# Patient Record
Sex: Female | Born: 1965
Health system: Southern US, Community
[De-identification: ages and names within clinical notes are randomized; demographics above are authoritative.]

## PROBLEM LIST (undated history)

## (undated) ENCOUNTER — Emergency Department (HOSPITAL_COMMUNITY): Disposition: A | Discharge status: Other Acute Inpatient Hospital | Payer: Self-pay | Source: Home / Self Care

## (undated) DIAGNOSIS — E785 Hyperlipidemia, unspecified: Secondary | ICD-10-CM

## (undated) DIAGNOSIS — M25519 Pain in unspecified shoulder: Secondary | ICD-10-CM

## (undated) DIAGNOSIS — G8929 Other chronic pain: Secondary | ICD-10-CM

## (undated) DIAGNOSIS — M542 Cervicalgia: Secondary | ICD-10-CM

## (undated) DIAGNOSIS — I10 Essential (primary) hypertension: Secondary | ICD-10-CM

## (undated) DIAGNOSIS — E0590 Thyrotoxicosis, unspecified without thyrotoxic crisis or storm: Secondary | ICD-10-CM

## (undated) HISTORY — PX: JEJUNOSTOMY FEEDING TUBE: SUR737

## (undated) HISTORY — PX: CYST EXCISION: SHX5701

## (undated) HISTORY — DX: Cervicalgia: M54.2

## (undated) HISTORY — PX: TONSILLECTOMY: SUR1361

## (undated) HISTORY — DX: Other chronic pain: G89.29

## (undated) HISTORY — DX: Pain in unspecified shoulder: M25.519

## (undated) HISTORY — DX: Hyperlipidemia, unspecified: E78.5

---

## 2008-08-12 HISTORY — PX: BREAST LUMPECTOMY: SHX2

## 2010-03-14 ENCOUNTER — Encounter (INDEPENDENT_AMBULATORY_CARE_PROVIDER_SITE_OTHER): Payer: Self-pay | Admitting: Nurse Practitioner

## 2010-04-23 ENCOUNTER — Encounter: Admission: RE | Admit: 2010-04-23 | Discharge: 2010-04-23 | Payer: Self-pay | Admitting: Infectious Diseases

## 2010-04-25 ENCOUNTER — Ambulatory Visit: Payer: Self-pay | Admitting: Nurse Practitioner

## 2010-04-25 DIAGNOSIS — R3129 Other microscopic hematuria: Secondary | ICD-10-CM | POA: Insufficient documentation

## 2010-04-25 LAB — CONVERTED CEMR LAB
Glucose, Urine, Semiquant: NEGATIVE
Protein, U semiquant: NEGATIVE
pH: 5.5

## 2010-04-26 ENCOUNTER — Encounter (INDEPENDENT_AMBULATORY_CARE_PROVIDER_SITE_OTHER): Payer: Self-pay | Admitting: Nurse Practitioner

## 2010-05-08 ENCOUNTER — Encounter (INDEPENDENT_AMBULATORY_CARE_PROVIDER_SITE_OTHER): Payer: Self-pay | Admitting: Nurse Practitioner

## 2010-05-10 ENCOUNTER — Encounter (INDEPENDENT_AMBULATORY_CARE_PROVIDER_SITE_OTHER): Payer: Self-pay | Admitting: Nurse Practitioner

## 2010-05-23 ENCOUNTER — Ambulatory Visit: Payer: Self-pay | Admitting: Nurse Practitioner

## 2010-05-23 DIAGNOSIS — B379 Candidiasis, unspecified: Secondary | ICD-10-CM | POA: Insufficient documentation

## 2010-05-23 LAB — CONVERTED CEMR LAB
Nitrite: NEGATIVE
Protein, U semiquant: NEGATIVE
Urobilinogen, UA: 0.2
WBC Urine, dipstick: NEGATIVE

## 2010-06-28 ENCOUNTER — Encounter (INDEPENDENT_AMBULATORY_CARE_PROVIDER_SITE_OTHER): Payer: Self-pay | Admitting: Nurse Practitioner

## 2010-06-28 ENCOUNTER — Encounter: Payer: Self-pay | Admitting: Nurse Practitioner

## 2010-06-28 ENCOUNTER — Ambulatory Visit: Payer: Self-pay | Admitting: Nurse Practitioner

## 2010-06-28 DIAGNOSIS — N946 Dysmenorrhea, unspecified: Secondary | ICD-10-CM | POA: Insufficient documentation

## 2010-06-28 DIAGNOSIS — E669 Obesity, unspecified: Secondary | ICD-10-CM | POA: Insufficient documentation

## 2010-07-03 ENCOUNTER — Encounter: Admission: RE | Admit: 2010-07-03 | Discharge: 2010-07-03 | Payer: Self-pay | Admitting: Internal Medicine

## 2010-08-28 ENCOUNTER — Encounter (INDEPENDENT_AMBULATORY_CARE_PROVIDER_SITE_OTHER): Payer: Self-pay | Admitting: Nurse Practitioner

## 2010-09-11 NOTE — Assessment & Plan Note (Signed)
Summary: F/u vaginal itching   Vital Signs:  Patient profile:   45 year old female LMP:     06/22/2010 Weight:      156.0 pounds BMI:     27.73 Temp:     97.0 degrees F oral Pulse rate:   80 / minute Pulse rhythm:   regular Resp:     20 per minute BP sitting:   110 / 80  (left arm) Cuff size:   regular  Vitals Entered By: Levon Hedger (June 28, 2010 10:29 AM)  Nutrition Counseling: Patient's BMI is greater than 25 and therefore counseled on weight management options. CC: lower stomach pain...pain is really bad during her period Is Patient Diabetic? No Pain Assessment Patient in pain? no     Location: stomach  Does patient need assistance? Functional Status Self care Ambulation Normal LMP (date): 06/22/2010 LMP - Character: normal     Menstrual Status regular Enter LMP: 06/22/2010   CC:  lower stomach pain...pain is really bad during her period.  History of Present Illness:  Pt into the office to f/u on vaginal itching as seen in office on last month pt was prescribed mycolog of which she reports she has used and symptoms have improved.  Mammogram - pt has never had a mammogram before. No family history of breast cancer No checks of breast at home  Language line used for interpreter - [place]  Allergies (verified): No Known Drug Allergies  Social History: Language - Nepali married 3 children tobacco - none ETOH - none drug - none  Review of Systems CV:  Denies chest pain or discomfort. Resp:  Denies cough. GI:  Complains of abdominal pain; denies nausea and vomiting; lower abdominal at onset of periods.. GU:  Denies any vaginal itching.  Physical Exam  General:  alert.   Head:  normocephalic.   Ears:  ear piercing(s) noted.   Nose:  left nasal piercing Msk:  up to the exam table - no limits Neurologic:  alert & oriented X3.     Impression & Recommendations:  Problem # 1:  CANDIDIASIS (ICD-112.9) Assessment Improved symptoms  improved  Problem # 2:  UNSPECIFIED BREAST SCREENING (ICD-V76.10) self breast exam reviewed with pt mammogram scheduled Orders: Mammogram (Screening) (Mammo)  Problem # 3:  DYSMENORRHEA (ICD-625.3) pt advised that she may be having cramps due to menses will given naprosyn as needed for cramps  Complete Medication List: 1)  Nystatin-triamcinolone 100000-0.1 Unit/gm-% Oint (Nystatin-triamcinolone) .... One application topically two times a day to affected area 2)  Naproxen 500 Mg Tabs (Naproxen) .... One tablet by mouth two times a day as needed for pain  Patient Instructions: 1)  Keep your appointment for mammogram 2)  Menstrual cramps - take naprosyn 500mg  by mouth two times a day when your period is on to help with the pain in your stomach. Eat some food before you take this medications so it does not make you have nausea 3)  Follow up as needed Prescriptions: NAPROXEN 500 MG TABS (NAPROXEN) One tablet by mouth two times a day as needed for pain  #50 x 0   Entered and Authorized by:   Lehman Prom FNP   Signed by:   Lehman Prom FNP on 06/28/2010   Method used:   Print then Give to Patient   RxID:   [id number]    Orders Added: 1)  Est. Patient Level III [14782] 2)  Mammogram (Screening) [Mammo]

## 2010-09-11 NOTE — Assessment & Plan Note (Signed)
Summary: NEW - Establish Care   Vital Signs:  Patient profile:   45 year old female LMP:     03/20/2010 Height:      63 inches (160.02 cm) Weight:      151.6 pounds (68.91 kg) BMI:     26.95 Temp:     98.2 degrees F (36.78 degrees C) oral Pulse rate:   80 / minute Pulse rhythm:   regular Resp:     20 per minute BP sitting:   114 / 76  (left arm) Cuff size:   regular  Vitals Entered By: Levon Hedger (April 25, 2010 10:16 AM)  Nutrition Counseling: Patient's BMI is greater than 25 and therefore counseled on weight management options. CC: new establish...painful urination Is Patient Diabetic? No Pain Assessment Patient in pain? yes     Location: neck  Does patient need assistance? Functional Status Self care Ambulation Normal LMP (date): 03/20/2010 LMP - Character: normal     Menstrual Status regular Enter LMP: 03/20/2010   CC:  new establish...painful urination.  History of Present Illness:  Pt into the office to establish care. Pt is from [place].  In [place] since 01/2010  PMH - non significant PSH - tonsillectomy  Pt has been to the [facility] since she arrived in the [place].  Reports that labs done, Immunization done (pt does not know which one) and PAP Smear was done.  No records available from [facility]. Pt reports that she was given this appt here today by [facility] but pt is not sure why.  Presents today with sister who speaks English  Habits & Providers  Alcohol-Tobacco-Diet     Alcohol drinks/day: 0     Tobacco Status: never  Exercise-Depression-Behavior     Have you felt down or hopeless? no     Have you felt little pleasure in things? no     Depression Counseling: not indicated; screening negative for depression     Drug Use: never  Medications Prior to Update: 1)  None  Allergies (verified): No Known Drug Allergies  Past History:  Past Surgical History: Tonsillectomy (done in [place])  Family History: non-contributory  Social History: married 3  children tobacco - none ETOH - none drug - noneSmoking Status:  never Drug Use:  never  Review of Systems CV:  Denies chest pain or discomfort. Resp:  Denies cough. GI:  Denies abdominal pain, nausea, and vomiting. GU:  Denies discharge, dysuria, hematuria, urinary frequency, and urinary hesitancy; some vaginal itching after urination. intermittent for 1 year. MS:  Denies low back pain.  Physical Exam  General:  alert.   Head:  normocephalic.   Ears:  ear piercing(s) noted.   Nose:  left nare nasal piercing Lungs:  normal breath sounds.   Heart:  normal rate and regular rhythm.   Abdomen:  normal bowel sounds.   Msk:  up to the exam table no limits Neurologic:  alert & oriented X3.   Skin:  color normal.   Psych:  Oriented X3.     Impression & Recommendations:  Problem # 1:  MICROSCOPIC HEMATURIA (ICD-599.72) will send for culture since pt is having some itching Orders: UA Dipstick w/o Micro (manual) (16109) T-Culture, Urine ([id number]) will need to review records from [facility] to determine what immunizations she needs  Complete Medication List: 1)  Fluconazole 150 Mg Tabs (Fluconazole) .... One tablet by mouth x 1 dose  Patient Instructions: 1)  Sign a release to get records from [facility] 2)  Follow up with  n.martin,fnp in 4 weeks for vaginal itching. 3)  will need to review records from [facility] by that time Prescriptions: FLUCONAZOLE 150 MG TABS (FLUCONAZOLE) One tablet by mouth x 1 dose  #1 x 0   Entered and Authorized by:   Lehman Prom FNP   Signed by:   Lehman Prom FNP on 04/25/2010   Method used:   Print then Give to Patient   RxID:   [id number]   Laboratory Results   Urine Tests  Date/Time Received: April 25, 2010 10:33 AM   Routine Urinalysis   Color: lt. yellow Appearance: Hazy Glucose: negative   (Normal Range: Negative) Bilirubin: small   (Normal Range: Negative) Ketone: trace (5)   (Normal Range: Negative) Spec. Gravity: >=1.030    (Normal Range: 1.003-1.035) Blood: trace-intact   (Normal Range: Negative) pH: 5.5   (Normal Range: 5.0-8.0) Protein: negative   (Normal Range: Negative) Urobilinogen: 0.2   (Normal Range: 0-1) Nitrite: negative   (Normal Range: Negative) Leukocyte Esterace: trace   (Normal Range: Negative)

## 2010-09-11 NOTE — Letter (Signed)
Summary: REQUESTING RECORDS FROM [facility]  REQUESTING RECORDS FROM [facility]   Imported By: Arta Bruce 05/14/2010 12:23:32  _____________________________________________________________________  External Attachment:    Type:   Image     Comment:   External Document

## 2010-09-11 NOTE — Letter (Signed)
Summary: TEST ORDER FORM//MAMMOGRAM  TEST ORDER FORM//MAMMOGRAM   Imported By: Arta Bruce 07/03/2010 15:45:37  _____________________________________________________________________  External Attachment:    Type:   Image     Comment:   External Document

## 2010-09-11 NOTE — Assessment & Plan Note (Signed)
Summary: Vaginal Itching   Vital Signs:  Patient profile:   45 year old female Menstrual status:  regular Weight:      153.8 pounds BMI:     27.34 Temp:     97.3 degrees F oral Pulse rate:   80 / minute Pulse rhythm:   regular Resp:     20 per minute BP sitting:   110 / 70  (left arm) Cuff size:   regular  Vitals Entered By: Levon Hedger (May 23, 2010 10:29 AM)  Nutrition Counseling: Patient's BMI is greater than 25 and therefore counseled on weight management options. CC: follow-up visit...stilll experiencing vaginal itching Is Patient Diabetic? No Pain Assessment Patient in pain? yes     Location: stomach  Does patient need assistance? Functional Status Self care Ambulation Normal  Immunization History:  Hepatitis B Immunization History:    Hepatitis B # 1:  historical (04/26/2010)  MMR Immunization History:    MMR # 1:  historical (10/10/2009)  Tetanus/Td Immunization History:    Tetanus/Td:  historical (03/14/2010)   CC:  follow-up visit...stilll experiencing vaginal itching.  History of Present Illness:  Pt into the office for f/u on vaginal itching. Pt was given fluconazole as ordered following last visit she now reports that irritation is mainly external. denies any discharge no hematuria no dysuria  Daughter is present here today to interpret  Allergies (verified): No Known Drug Allergies  Review of Systems CV:  Denies chest pain or discomfort. Resp:  Denies cough. GI:  Denies abdominal pain, nausea, and vomiting. GU:  Denies discharge, dysuria, hematuria, and urinary frequency.  Physical Exam  General:  alert.   Head:  normocephalic.   Lungs:  normal breath sounds.   Heart:  normal rate and regular rhythm.   Abdomen:  normal bowel sounds.   Genitalia:  external irritation in inguinal folds Genitalia:  external irritation no discharge   Impression & Recommendations:  Problem # 1:  CANDIDIASIS (ICD-112.9) will treat with  external cream  Problem # 2:  NEED PROPHYLACTIC VACCINATION&INOCULATION FLU (ICD-V04.81) given today in office  Complete Medication List: 1)  Nystatin-triamcinolone 100000-0.1 Unit/gm-% Oint (Nystatin-triamcinolone) .... One application topically two times a day to affected area  Other Orders: Flu Vaccine 65yrs + (19147) Admin 1st Vaccine (82956) Admin 1st Vaccine Horticulturist, commercial) [id number])  Patient Instructions: 1)  You have been given the flu vaccine today 2)  Use the cream to the affected area two times a day until healed 3)  Follow up as needed 4)  Will need to discuss mammogram  Prescriptions: NYSTATIN-TRIAMCINOLONE 100000-0.1 UNIT/GM-% OINT (NYSTATIN-TRIAMCINOLONE) ONe application topically two times a day to affected area  #30gm x 0   Entered and Authorized by:   Lehman Prom FNP   Signed by:   Lehman Prom FNP on 05/23/2010   Method used:   Print then Give to Patient   RxID:   [id number]   Prevention & Chronic Care Immunizations   Influenza vaccine: Fluvax 3+  (05/23/2010)    Tetanus booster: 03/14/2010: Historical    Pneumococcal vaccine: Not documented  Other Screening   Pap smear: Not documented    Mammogram: Not documented   Smoking status: never  (04/25/2010)  Lipids   Total Cholesterol: Not documented   LDL: Not documented   LDL Direct: Not documented   HDL: Not documented   Triglycerides: Not documented   Nursing Instructions: Give Flu vaccine today   Laboratory Results   Urine Tests  Date/Time Received: May 23, 2010 11:11  AM   Routine Urinalysis   Color: lt. yellow Appearance: Clear Glucose: negative   (Normal Range: Negative) Bilirubin: negative   (Normal Range: Negative) Ketone: negative   (Normal Range: Negative) Spec. Gravity: >=1.030   (Normal Range: 1.003-1.035) Blood: small   (Normal Range: Negative) pH: 6.0   (Normal Range: 5.0-8.0) Protein: negative   (Normal Range: Negative) Urobilinogen: 0.2   (Normal Range:  0-1) Nitrite: negative   (Normal Range: Negative) Leukocyte Esterace: negative   (Normal Range: Negative)         Influenza Vaccine    Vaccine Type: Fluvax 3+    Site: left deltoid    Mfr: GlaxoSmithKline    Dose: 0.5 ml    Route: IM    Given by: Levon Hedger    Exp. Date: 01/2011    Lot #: [id number]    VIS given: 03/06/10 version given May 23, 2010.  Flu Vaccine Consent Questions    Do you have a history of severe allergic reactions to this vaccine? no    Any prior history of allergic reactions to egg and/or gelatin? no    Do you have a sensitivity to the preservative Thimersol? no    Do you have a past history of Guillan-Barre Syndrome? no    Do you currently have an acute febrile illness? no    Have you ever had a severe reaction to latex? no    Vaccine information given and explained to patient? yes    Are you currently pregnant? no    ndc  [phone]

## 2010-09-13 NOTE — Letter (Signed)
Summary: RECEIVED RECORDS FROM [facility]//HISTORICAL CHART  RECEIVED RECORDS FROM [facility]//HISTORICAL CHART   Imported By: Arta Bruce 08/28/2010 16:57:47  _____________________________________________________________________  External Attachment:    Type:   Image     Comment:   External Document

## 2010-09-13 NOTE — Miscellaneous (Signed)
Summary: Hx - [facility]  Clinical Lists Changes  Records recevied from [facility] - historical file created  Observations: Added new observation of RPR: nonreactive (03/14/2010 9:25) Added new observation of GC DNA PROBE: negative - done at [facility] (03/14/2010 9:25)

## 2010-12-11 ENCOUNTER — Ambulatory Visit (HOSPITAL_COMMUNITY)
Admission: RE | Admit: 2010-12-11 | Discharge: 2010-12-11 | Payer: Self-pay | Source: Home / Self Care | Attending: Internal Medicine | Admitting: Internal Medicine

## 2011-06-07 ENCOUNTER — Other Ambulatory Visit: Payer: Self-pay | Admitting: Internal Medicine

## 2011-06-07 DIAGNOSIS — Z1231 Encounter for screening mammogram for malignant neoplasm of breast: Secondary | ICD-10-CM

## 2011-06-21 ENCOUNTER — Encounter: Payer: Self-pay | Admitting: *Deleted

## 2011-06-21 ENCOUNTER — Emergency Department (INDEPENDENT_AMBULATORY_CARE_PROVIDER_SITE_OTHER)
Admission: EM | Admit: 2011-06-21 | Discharge: 2011-06-21 | Disposition: A | Discharge status: Home / Self Care | Payer: BC Managed Care – PPO | Source: Home / Self Care | Attending: Emergency Medicine | Admitting: Emergency Medicine

## 2011-06-21 DIAGNOSIS — M79603 Pain in arm, unspecified: Secondary | ICD-10-CM

## 2011-06-21 DIAGNOSIS — R519 Headache, unspecified: Secondary | ICD-10-CM

## 2011-06-21 DIAGNOSIS — M542 Cervicalgia: Secondary | ICD-10-CM

## 2011-06-21 DIAGNOSIS — R109 Unspecified abdominal pain: Secondary | ICD-10-CM

## 2011-06-21 DIAGNOSIS — M79609 Pain in unspecified limb: Secondary | ICD-10-CM

## 2011-06-21 DIAGNOSIS — R51 Headache: Secondary | ICD-10-CM

## 2011-06-21 DIAGNOSIS — R079 Chest pain, unspecified: Secondary | ICD-10-CM

## 2011-06-21 DIAGNOSIS — M25519 Pain in unspecified shoulder: Secondary | ICD-10-CM

## 2011-06-21 MED ORDER — TRAMADOL HCL 50 MG PO TABS: 100.0000 mg | ORAL_TABLET | Freq: Three times a day (TID) | ORAL | Status: AC | PRN

## 2011-06-21 MED ORDER — MELOXICAM 7.5 MG PO TABS: 7.5000 mg | ORAL_TABLET | Freq: Every day | ORAL | Status: AC

## 2011-06-21 NOTE — ED Provider Notes (Signed)
History     CSN: [id number] Arrival date & time: 06/21/2011  8:03 PM   First MD Initiated Contact with Patient 06/21/11 1947      Chief Complaint  Patient presents with  . Headache    Pain Left Side of Head down to abd    (Consider location/radiation/quality/duration/timing/severity/associated sxs/prior treatment) HPI Comments: Becky Goodwin is a 45 year old Nepali female who has had a three-month history of pain extending from the face on down to the lower abdomen. This involves the face, neck, right shoulder, right arm, right hand, right chest, and right abdomen. It's described as a throbbing and is rated 8/10 in intensity. She has some pain every day although this gets better and worse. She has been able to work everyday with the pain. She has tried some over-the-counter pain meds but this tends to cause nausea. She denies any pain in her lower extremities. She has not seen a physician for this. Sometimes this is associated with rapid heartbeat and feeling of fear. She denies any fever, chills, neurological symptoms, shortness of breath, diarrhea, or blood in the stool.  Patient is a 45 y.o. female presenting with headaches.  Headache The primary symptoms include headaches. Primary symptoms do not include fever, nausea or vomiting.  The headache is not associated with neck stiffness.  Additional symptoms do not include neck stiffness.    History reviewed. No pertinent past medical history.  Past Surgical History  Procedure Date  . Tonsillectomy     Right tonsillectomy  . Breast lumpectomy     No family history on file.  History  Substance Use Topics  . Smoking status: Never Smoker   . Smokeless tobacco: Not on file  . Alcohol Use: No    OB History    Grav Para Term Preterm Abortions TAB SAB Ect Mult Living                  Review of Systems  Constitutional: Negative for fever, chills, fatigue and unexpected weight change.  HENT: Positive for neck pain. Negative for  ear pain, congestion, sore throat, facial swelling, rhinorrhea, neck stiffness and sinus pressure.   Eyes: Negative for redness and visual disturbance.  Respiratory: Negative for cough, shortness of breath and wheezing.   Cardiovascular: Positive for chest pain, palpitations and leg swelling.  Gastrointestinal: Positive for abdominal pain. Negative for nausea, vomiting, diarrhea, constipation, blood in stool and abdominal distention.  Genitourinary: Negative for dysuria, urgency and frequency.  Skin: Negative for rash.  Neurological: Positive for headaches.    Allergies  Review of patient's allergies indicates no known allergies.  Home Medications   Current Outpatient Rx  Name Route Sig Dispense Refill  . MELOXICAM 7.5 MG PO TABS Oral Take 1 tablet (7.5 mg total) by mouth daily. 15 tablet 0  . TRAMADOL HCL 50 MG PO TABS Oral Take 2 tablets (100 mg total) by mouth every 8 (eight) hours as needed for pain. Maximum dose= 8 tablets per day 30 tablet 0    BP 133/94  Pulse 76  Temp(Src) 98.1 F (36.7 C) (Oral)  Resp 16  SpO2 99%  LMP 05/31/2011  Physical Exam  Nursing note and vitals reviewed. Constitutional: She is oriented to person, place, and time. She appears well-developed and well-nourished. No distress.  HENT:  Head: Normocephalic and atraumatic.  Mouth/Throat: Oropharynx is clear and moist.       There was diffuse tenderness over the entire right side of the face. No swelling or rash.  Eyes: Conjunctivae and EOM are normal. Pupils are equal, round, and reactive to light. No scleral icterus.  Neck: Normal range of motion. Neck supple.       There is tenderness to palpation over the entire right side of the neck. The neck had a full range of motion with pain on movement.  Cardiovascular: Normal rate, regular rhythm and normal heart sounds.  Exam reveals no gallop and no friction rub.   No murmur heard. Pulmonary/Chest: Effort normal and breath sounds normal. No respiratory  distress. She has no wheezes. She has no rales. She exhibits tenderness.       She has diffuse tenderness to palpation over the entire right chest anteriorly and posteriorly without any rash or swelling.  Abdominal: Soft. Bowel sounds are normal. She exhibits no distension and no mass. There is tenderness. There is no rebound and no guarding.       She has diffuse tenderness to palpation over the entire right hemi-abdomen without guarding or rebound.  Lymphadenopathy:    She has no cervical adenopathy.  Neurological: She is alert and oriented to person, place, and time. She displays normal reflexes. No cranial nerve deficit. She exhibits normal muscle tone. Coordination normal.  Skin: Skin is warm and dry. No rash noted. She is not diaphoretic.  Psychiatric:       She appears depressed.    ED Course  Procedures (including critical care time)  Labs Reviewed - No data to display No results found.   1. Facial pain   2. Neck pain   3. Shoulder pain   4. Arm pain   5. Chest pain   6. Abdominal pain       MDM  She has diffuse pain that does not follow any particular anatomical or pathogenic mechanism. I'm not certain this won't all turn out to be do to to depression. Tonight, I don't see any sign of any physical illness. I offered her either to transport to the hospital or following up with a neurologist as an outpatient. She stated through an interpreter that she did not want to go the hospital and that she would followup as an outpatient with medications for pain control.        Roque Lias, MD 06/21/11 2209

## 2011-06-21 NOTE — ED Notes (Signed)
[facility]ia [facility] [phone] Rubbie Battiest): Pt c/o left sided HA radiating down left side of body to abd (excluding lower extremity) intermittently x 3 months.  Pain is "very bad" at times, and only mild other times.  Has been taking OTC HA med (unk name) with some relief.  Occasionally has nausea w/ HA; no vomiting.  Has not seen PCP RE: c/o.

## 2011-06-21 NOTE — ED Notes (Signed)
All discharge instructions reviewed w/ pt & family member via Education officer, community.  Pt verbalized understanding of all instructions.

## 2011-07-02 ENCOUNTER — Telehealth (HOSPITAL_COMMUNITY): Payer: Self-pay | Admitting: *Deleted

## 2011-07-08 ENCOUNTER — Ambulatory Visit
Admission: RE | Admit: 2011-07-08 | Discharge: 2011-07-08 | Disposition: A | Discharge status: Home / Self Care | Payer: BC Managed Care – PPO | Source: Ambulatory Visit | Attending: Internal Medicine | Admitting: Internal Medicine

## 2011-07-08 DIAGNOSIS — Z1231 Encounter for screening mammogram for malignant neoplasm of breast: Secondary | ICD-10-CM

## 2011-08-02 NOTE — ED Notes (Signed)
Fax received from [facility] that pt. has appt. for f/u 08/19/11 @ 101030.  Pt. is aware of the appt. Becky Goodwin 08/02/2011

## 2012-01-14 IMAGING — CR DG CHEST 2V
2 series · 2 of 2 positions shown · non-contrast
Comparison: Chest radiograph performed 08/14/2009 at the
[employer], [place], [place]

CLINICAL DATA: History of abnormal chest x-ray.  Cough.

CHEST - 2 VIEW

[view not recorded (1 of 2)]
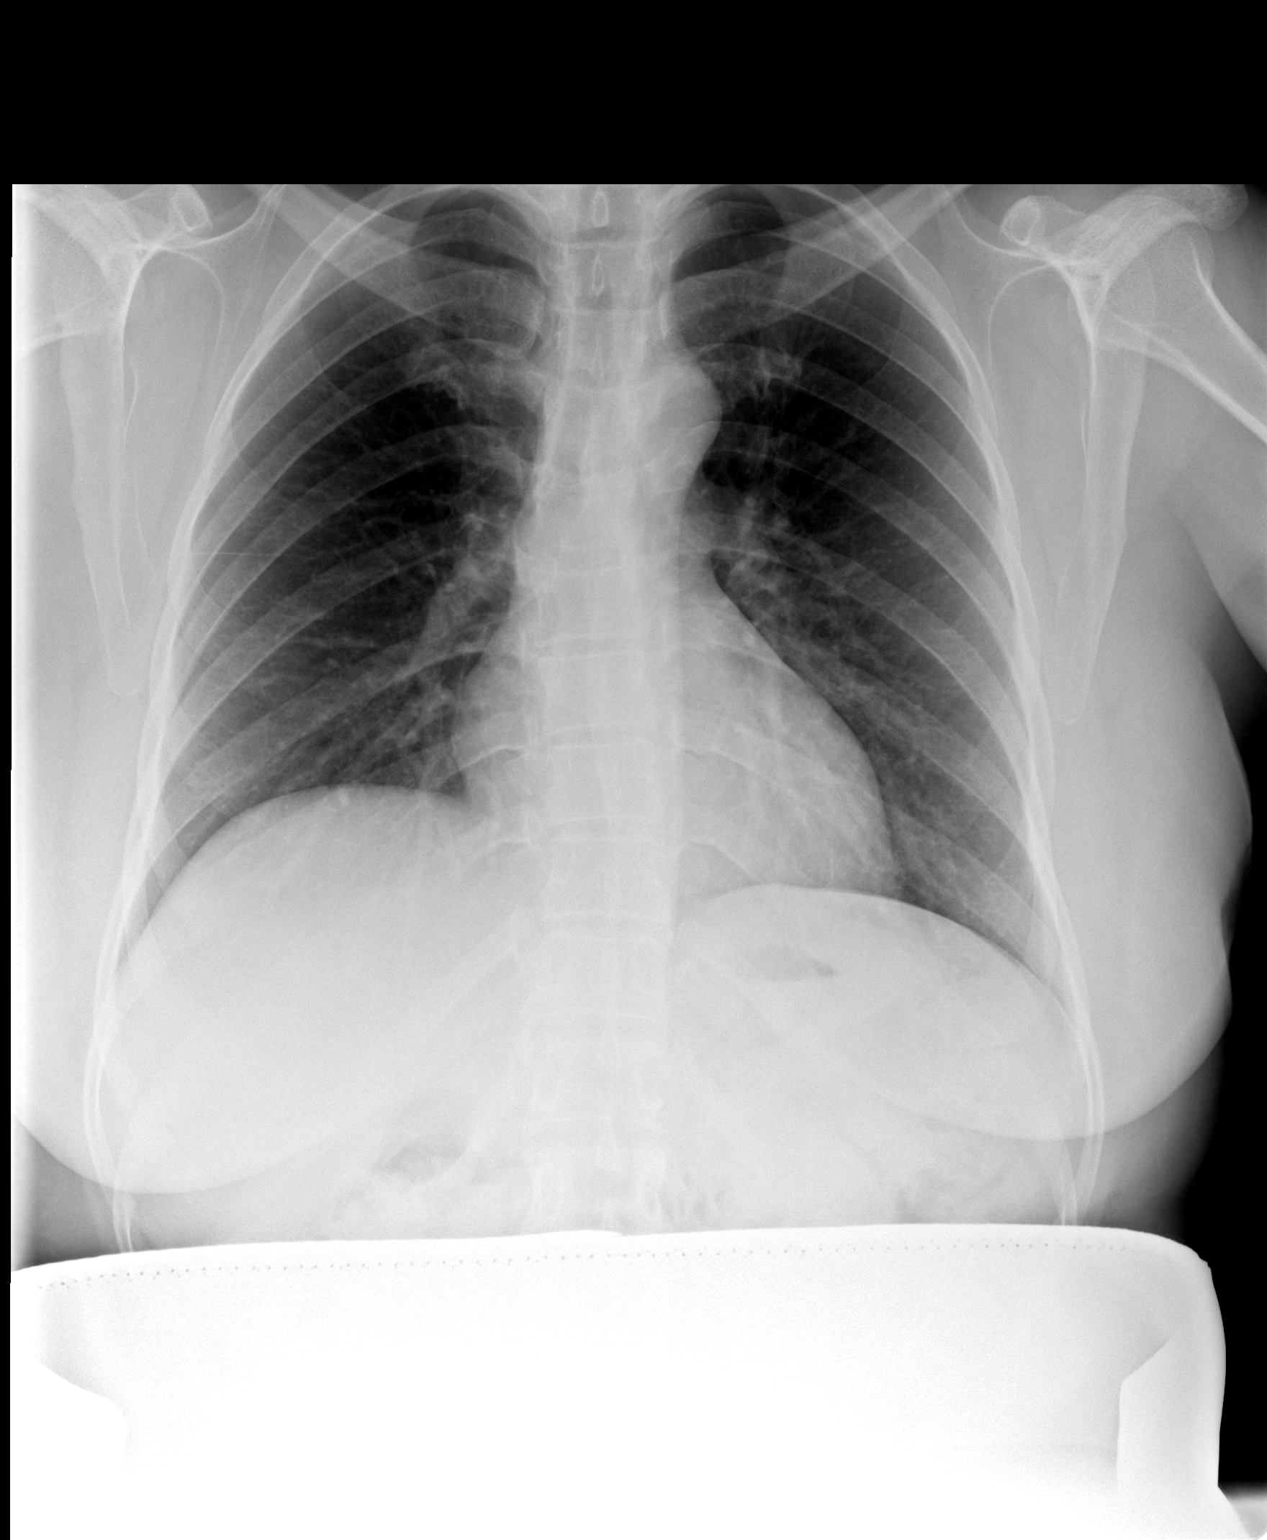

[view not recorded (2 of 2)]
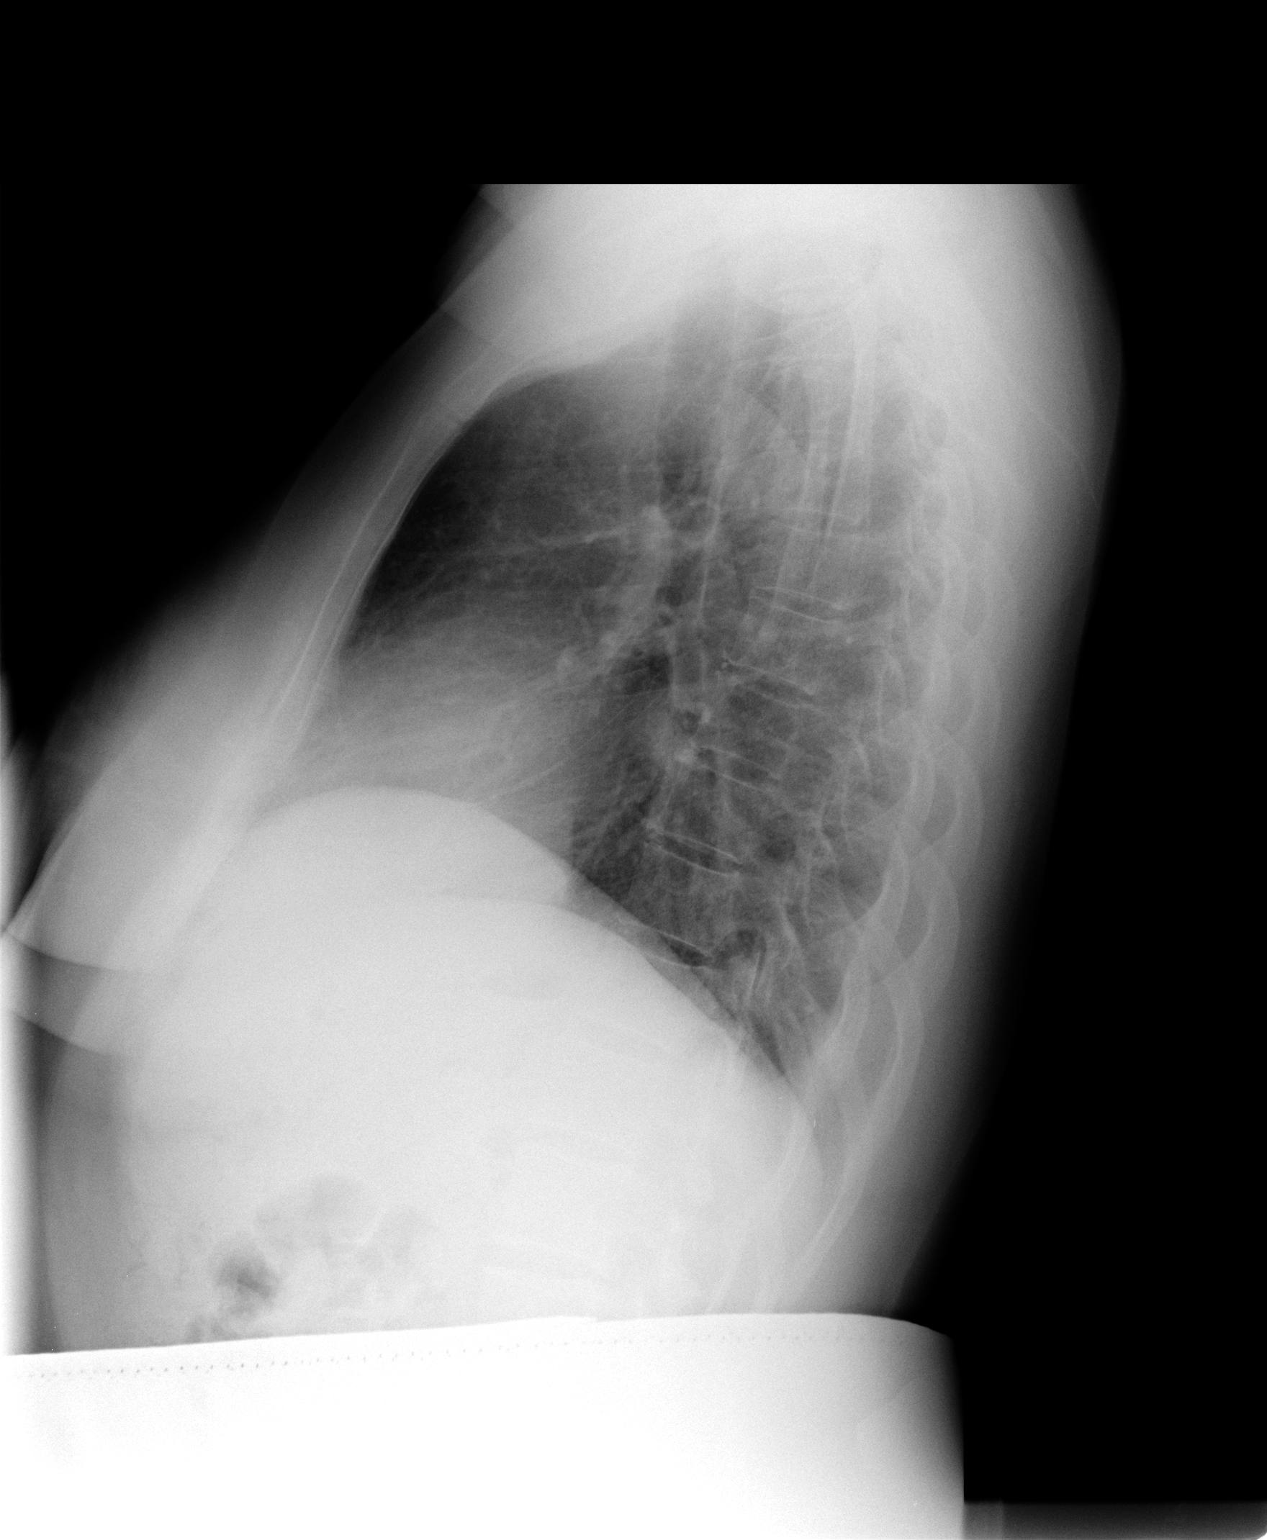

[2 of 2 positions shown; findings below may reference images not displayed]

FINDINGS: Trachea is midline.  Heart size normal.  Lungs are clear.
No pleural fluid.
IMPRESSION: No acute findings.

## 2012-07-24 ENCOUNTER — Other Ambulatory Visit: Payer: Self-pay | Admitting: Internal Medicine

## 2012-07-24 DIAGNOSIS — Z1231 Encounter for screening mammogram for malignant neoplasm of breast: Secondary | ICD-10-CM

## 2012-07-29 ENCOUNTER — Ambulatory Visit (INDEPENDENT_AMBULATORY_CARE_PROVIDER_SITE_OTHER): Payer: BC Managed Care – PPO | Admitting: Medical

## 2012-07-29 ENCOUNTER — Encounter: Payer: Self-pay | Admitting: Medical

## 2012-07-29 VITALS — BP 140/100 | HR 62 | Temp 97.7°F | Resp 16 | Ht 64.0 in | Wt 167.0 lb

## 2012-07-29 DIAGNOSIS — S99911A Unspecified injury of right ankle, initial encounter: Secondary | ICD-10-CM

## 2012-07-29 DIAGNOSIS — R071 Chest pain on breathing: Secondary | ICD-10-CM

## 2012-07-29 DIAGNOSIS — S99929A Unspecified injury of unspecified foot, initial encounter: Secondary | ICD-10-CM

## 2012-07-29 DIAGNOSIS — M25519 Pain in unspecified shoulder: Secondary | ICD-10-CM

## 2012-07-29 DIAGNOSIS — S8990XA Unspecified injury of unspecified lower leg, initial encounter: Secondary | ICD-10-CM

## 2012-07-29 DIAGNOSIS — R0789 Other chest pain: Secondary | ICD-10-CM

## 2012-07-29 DIAGNOSIS — M542 Cervicalgia: Secondary | ICD-10-CM

## 2012-07-29 DIAGNOSIS — M25511 Pain in right shoulder: Secondary | ICD-10-CM

## 2012-07-29 DIAGNOSIS — G518 Other disorders of facial nerve: Secondary | ICD-10-CM

## 2012-07-29 MED ORDER — CYCLOBENZAPRINE HCL 10 MG PO TABS: 10.0000 mg | ORAL_TABLET | Freq: Every evening | ORAL | Status: DC | PRN

## 2012-07-29 MED ORDER — DICLOFENAC SODIUM 75 MG PO TBEC: 75.0000 mg | DELAYED_RELEASE_TABLET | Freq: Two times a day (BID) | ORAL | Status: DC

## 2012-07-29 NOTE — Progress Notes (Signed)
Subjective:   HPI  Becky Goodwin is a 46 y.o. female who presents as a new patient.  She is originally from [place], [place].  Is here today with husband who is a recently new patient of mine and interpreter.   She moved here in 04/2010, doesn't speak english.   She has a 2-3 year hx/o pains.  Has pain in the right face, sometimes pain, sometimes sharp pain, sometimes burning feeling, worse on the right, sometimes over TMJ, but usually generalized right face.  No rash or swelling.  No teeth pain, denies clenching teeth.  This comes and goes.    She notes similar time frame with pains in right neck, shoulder, upper chest, upper back.  This pain is also intermittent, sore, sometimes sharp, sometimes lasts 2-3 days.   She works in Systems developer, marking clothing al day long, is right handed.  No heavy lifting.  She denies injury, trauma.  She had normal mammogram 2012.  No new breast lumps, skin changes or tenderness.  No CP, SOB.    Denies weakness, tingling, but occasional does have numbness in right cheek/face.  No other aggravating or relieving factors.  She did see [facility] for this months ago, thinks she had a scan of some sort, but never heard back from them.  She has right ankle injury 51mo ago, turned the ankle, has ongoing pain, swelling, no improvement.  No other c/o.  The following portions of the patient's history were reviewed and updated as appropriate: allergies, current medications, past family history, past medical history, past social history, past surgical history and problem list.  History reviewed. No pertinent past medical history.  No Known Allergies   Review of Systems Constitutional: -fever, -chills, -sweats, -unexpected -weight change,-fatigue HEENT: negative, no teeth pain Cardiology:  -palpitations, -edema Respiratory: -cough, -shortness of breath, -wheezing Gastroenterology: -abdominal pain, -nausea, -vomiting, -diarrhea, -constipation  Hematology: -bleeding  or bruising problems Ophthalmology: -vision changes Urology: -dysuria, -difficulty urinating, -hematuria, -urinary frequency, -urgency Neurology: -headache, -weakness, -tingling, -numbness       Objective:   Physical Exam  General appearance: alert, no distress, WD/WN HEENT: normocephalic, sclerae anicteric, TMs pearly, nares patent, no discharge or erythema, pharynx normal Face tender along right side, tender mildly over right TMJ Oral cavity: MMM, no lesions, teeth in good repair Neck: supple, tender right side in general, no lymphadenopathy, no thyromegaly, no masses, ROM full but with mild pain Heart: RRR, normal S1, S2, no murmurs Lungs: CTA bilaterally, no wheezes, rhonchi, or rales Back: mild generalized paraspinal tenderness upper back, no midline tenderness, normal ROM MSK: tender right deltoid/shoulder in general, mild pain with ROM, but ROM full both active and passive, no obvious deformity, negative special tests, right ankle with lateral malleolar swelling tenderness,s and reduced ROM, otherwise rest of UE unremarkable Neuro: normal UE strength, sensation, DTRs Pulses normal  Assessment and Plan :     Encounter Diagnoses  Name Primary?  . Shoulder pain, right Yes  . Cervicalgia   . Neuralgic facial pain   . Chest wall pain   . Right ankle injury    Advised that her face pain and other pains seem to be 2 different pain issues.  Facial pain may possibly be trigeminal neuralgia.  Less likely TMJ or dental issue.  Will await neurology records.  Shoulder, neck and chest wall pain - etiology could include shoulder arthritis or shoulder pathology, C spine radiculopathy, or even spasm of right upper back.   Begin trial of diclofenac, flexeril,  discussed goals of medication risks, advised daily stretching routine, consider PT, and recheck 3wk.  Right ankle injury- most likely sprain.  Will send for xray.   Advised elevation, rest, ice ,compression with ACE, try and limit  weight bearing for a week.  Recheck 3wk.  Spent over 45 min face to face.  Extra time needed for interpretation, review of plan, eval, and to get detailed history.  Addendum: After the visit received [facility] notes from 02/20/12.  Diagnosed was cephalgia, numbness, cervicalgia, musculoskeletal strain, and plan was OTC tylenol and ibuprofen.

## 2012-07-29 NOTE — Patient Instructions (Signed)
Your facial pain is likely different from the neck, shoulder and chest pain.  We will get records from neurology.  For the neck, shoulder, back pain - Begin Diclofenac twice daily for pain and inflammation.  Begin Flexeril muscle relaxer.  Take this as needed at night for muscle spasm in the neck and back.   Caution as this can cause drowsiness.  Do daily stretches to loosen up the neck and shoulder and back prior to work.  She can also do these stretches when the pain occurs.   You can experiment with heat or ice to help the pain.   For the facial pain - Use the diclofenac for now   Ankle pain This is an ankle sprain Get an ankle brace or sleeve OTC Go for xray to rule out fracture Use ice, elevation

## 2012-09-01 ENCOUNTER — Ambulatory Visit: Payer: BC Managed Care – PPO

## 2012-09-18 ENCOUNTER — Emergency Department (INDEPENDENT_AMBULATORY_CARE_PROVIDER_SITE_OTHER)
Admission: EM | Admit: 2012-09-18 | Discharge: 2012-09-18 | Disposition: A | Discharge status: Home / Self Care | Payer: BC Managed Care – PPO | Source: Home / Self Care | Attending: Emergency Medicine | Admitting: Emergency Medicine

## 2012-09-18 ENCOUNTER — Emergency Department (INDEPENDENT_AMBULATORY_CARE_PROVIDER_SITE_OTHER): Payer: BC Managed Care – PPO

## 2012-09-18 ENCOUNTER — Encounter (HOSPITAL_COMMUNITY): Payer: Self-pay | Admitting: Emergency Medicine

## 2012-09-18 DIAGNOSIS — J189 Pneumonia, unspecified organism: Secondary | ICD-10-CM

## 2012-09-18 HISTORY — DX: Essential (primary) hypertension: I10

## 2012-09-18 MED ORDER — BENZONATATE 200 MG PO CAPS: 200.0000 mg | ORAL_CAPSULE | Freq: Three times a day (TID) | ORAL | Status: DC | PRN

## 2012-09-18 MED ORDER — AZITHROMYCIN 250 MG PO TABS: ORAL_TABLET | ORAL | Status: DC

## 2012-09-18 MED ORDER — CEFTRIAXONE SODIUM 1 G IJ SOLR
INTRAMUSCULAR | Status: AC
  Filled 2012-09-18: qty 10

## 2012-09-18 MED ORDER — CEFTRIAXONE SODIUM 1 G IJ SOLR
1.0000 g | Freq: Once | INTRAMUSCULAR | Status: AC
  Administered 2012-09-18: 1 g via INTRAMUSCULAR

## 2012-09-18 MED ORDER — LIDOCAINE HCL (PF) 1 % IJ SOLN
INTRAMUSCULAR | Status: AC
  Filled 2012-09-18: qty 5

## 2012-09-18 NOTE — ED Notes (Signed)
C/o cough, sneezing, sore throat and fever onset yesterday. No earache.  C/o muscle aches.

## 2012-09-18 NOTE — ED Provider Notes (Signed)
Chief Complaint  Patient presents with  . Fever  . Cough    History of Present Illness:   Becky Goodwin  is a 47 year-old [place] female who is accompanied by her daughter who serves as her interpreter. She presents with a two-day history of cough productive of white sputum, fever, chills, sore throat, nasal congestion, rhinorrhea, and headache. She has a history of high blood pressure and is being followed at [facility]. She has not been exposed to anything in particular and has not tried any medications at home for symptomatic relief. She has had no chest pain, wheezing, shortness of breath, or GI symptoms.  Review of Systems:  Other than noted above, the patient denies any of the following symptoms. Systemic:  No fever, chills, sweats, fatigue, myalgias, headache, or anorexia. Eye:  No redness, pain or drainage. ENT:  No earache, ear congestion, nasal congestion, sneezing, rhinorrhea, sinus pressure, sinus pain, post nasal drip, or sore throat. Lungs:  No cough, sputum production, wheezing, shortness of breath, or chest pain. GI:  No abdominal pain, nausea, vomiting, or diarrhea.  PMFSH:  Past medical history, family history, social history, meds, and allergies were reviewed.  Physical Exam:   Vital signs:  BP 148/96  Pulse 78  Temp 98.3 F (36.8 C) (Oral)  Resp 20  SpO2 99%  LMP 08/13/2012 General:  Alert, in no distress. Eye:  No conjunctival injection or drainage. Lids were normal. ENT:  TMs and canals were normal, without erythema or inflammation.  Nasal mucosa was clear and uncongested, without drainage.  Mucous membranes were moist.  Pharynx was clear, without exudate or drainage.  There were no oral ulcerations or lesions. Neck:  Supple, no adenopathy, tenderness or mass. Lungs:  No respiratory distress.  Lungs were clear to auscultation, without wheezes, rales or rhonchi.  Breath sounds were clear and equal bilaterally.  Heart:  Regular rhythm, without gallops,  murmers or rubs. Skin:  Clear, warm, and dry, without rash or lesions.  Radiology:  Dg Chest 2 View  09/18/2012  *RADIOLOGY REPORT*  Clinical Data: Productive cough for 2 days, fever  CHEST - 2 VIEW  Comparison: Chest x-ray of 04/23/2010  Findings: On the lateral view there are slightly prominent markings extending anteriorly within an upper lobe, and developing pneumonia cannot be excluded.  There is some peribronchial thickening present most consistent with bronchitis.  No effusion is seen.  The heart is within normal limits in size.  No bony abnormality is noted.  IMPRESSION: Probable bronchitis, but cannot exclude developing pneumonia in an upper lobe as noted on the lateral view.   Original Report Authenticated By: Dwyane Dee, M.D.      I reviewed the images independently and personally and concur with the radiologist's findings.  Course in Urgent Care Center:   She was given Rocephin 1 g IM.  Assessment:  The encounter diagnosis was Community acquired pneumonia.  Plan:   1.  The following meds were prescribed:   New Prescriptions   AZITHROMYCIN (ZITHROMAX Z-PAK) 250 MG TABLET    Take as directed.   BENZONATATE (TESSALON) 200 MG CAPSULE    Take 1 capsule (200 mg total) by mouth 3 (three) times daily as needed for cough.   2.  The patient was instructed in symptomatic care and handouts were given. 3.  The patient was told to return if becoming worse in any way, for a recheck in 48-72 hours, and given some red flag symptoms that would indicate earlier return.  Reuben Likes, MD 09/18/12 [phone]

## 2012-09-21 ENCOUNTER — Other Ambulatory Visit: Payer: Self-pay | Admitting: Medical

## 2012-09-21 ENCOUNTER — Ambulatory Visit (INDEPENDENT_AMBULATORY_CARE_PROVIDER_SITE_OTHER): Payer: BC Managed Care – PPO | Admitting: Medical

## 2012-09-21 ENCOUNTER — Encounter: Payer: Self-pay | Admitting: Medical

## 2012-09-21 VITALS — BP 120/84 | HR 100 | Temp 97.8°F | Resp 16 | Wt 162.0 lb

## 2012-09-21 DIAGNOSIS — I10 Essential (primary) hypertension: Secondary | ICD-10-CM

## 2012-09-21 DIAGNOSIS — R059 Cough, unspecified: Secondary | ICD-10-CM

## 2012-09-21 DIAGNOSIS — R030 Elevated blood-pressure reading, without diagnosis of hypertension: Secondary | ICD-10-CM

## 2012-09-21 DIAGNOSIS — IMO0001 Reserved for inherently not codable concepts without codable children: Secondary | ICD-10-CM

## 2012-09-21 DIAGNOSIS — R05 Cough: Secondary | ICD-10-CM

## 2012-09-21 LAB — CBC WITH DIFFERENTIAL/PLATELET
Basophils Absolute: 0 10*3/uL (ref 0.0–0.1)
Eosinophils Absolute: 0.2 10*3/uL (ref 0.0–0.7)
Lymphocytes Relative: 36 % (ref 12–46)
Lymphs Abs: 2.7 10*3/uL (ref 0.7–4.0)
Neutrophils Relative %: 54 % (ref 43–77)
Platelets: 300 10*3/uL (ref 150–400)
RBC: 4.83 MIL/uL (ref 3.87–5.11)
WBC: 7.6 10*3/uL (ref 4.0–10.5)

## 2012-09-21 LAB — LIPID PANEL
HDL: 33 mg/dL — ABNORMAL LOW (ref >39)
Triglycerides: 222 mg/dL — ABNORMAL HIGH (ref <150)

## 2012-09-21 LAB — COMPREHENSIVE METABOLIC PANEL
Albumin: 4.7 g/dL (ref 3.5–5.2)
CO2: 28 mEq/L (ref 19–32)
Chloride: 102 mEq/L (ref 96–112)
Glucose, Bld: 123 mg/dL — ABNORMAL HIGH (ref 70–99)
Potassium: 4.4 mEq/L (ref 3.5–5.3)
Sodium: 138 mEq/L (ref 135–145)
Total Protein: 7.6 g/dL (ref 6.0–8.3)

## 2012-09-21 LAB — TSH: TSH: 0.143 u[IU]/mL — ABNORMAL LOW (ref 0.350–4.500)

## 2012-09-21 MED ORDER — LISINOPRIL-HYDROCHLOROTHIAZIDE 10-12.5 MG PO TABS: 1.0000 | ORAL_TABLET | Freq: Every day | ORAL | Status: DC

## 2012-09-21 NOTE — Progress Notes (Signed)
Subjective:  Becky Goodwin is a 47 y.o. 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  She was seen 3 days in Urgent Care for respiratory infection, diagnosed with pneumonia.  She had a shot of antibiotic, and on oral antibiotics.  Feeling better in this regard.  She is here because Urgent Care asked her to f/u with [place] due to elevated BPs.  She notes no prior diagnosis of hypertension.    She has been in usual state of health prior to the infection last week.  No chest pain, blurred vision, urinary c/o, palpations.  She does snore, sometimes not feeling refreshed during the day.  Sometime feels sleepy during the day.  No other c/o.  Family medical history: father with hypertension, otherwise, no heart disease, cancer, diabetes, hyperlipidemia.  The following portions of the patient's history were reviewed and updated as appropriate: allergies, current medications, past family history, past medical history, past social history, past surgical history and problem list.  ROS Otherwise as in subjective above  Objective: Physical Exam  Vital signs reviewed  General appearance: alert, no distress, WD/WN HEENT: normocephalic, sclerae anicteric, conjunctiva pink and moist, TMs pearly, nares patent, no discharge or erythema, pharynx normal Oral cavity: MMM, no lesions Neck: supple, no lymphadenopathy, no thyromegaly, no masses,no bruits Heart: RRR, normal S1, S2, no murmurs Lungs: lower right field rhonchi, otherwise decreased breath sounds, no wheezes or rales Abdomen: +bs, soft, non tender, non distended, no masses, no hepatomegaly, no splenomegaly Pulses: 2+ radial pulses, 2+ pedal pulses, normal cap refill Ext: no edema   Adult ECG Report  Indication: new onset HTN  Rate: 85 bpm  Rhythm: normal sinus rhythm  QRS Axis: 08 degrees  PR Interval: 166 ms  QRS Duration: 86ms  QTc:  Conduction Disturbances: none  Other Abnormalities: none  Patient's cardiac risk factors are:  hypertension.  EKG comparison: none  Narrative Interpretation: left axis deviation, otherwise no acute changes, normal EKG  Assessment: Encounter Diagnoses  Name Primary?  Marland Kitchen Unspecified essential hypertension Yes  . Elevated blood pressure   . Cough     Plan: Hypertension - new diagnosis today based on prior BP readings, patient concerns, abnormal readings today as well.  Discussed risks of elevated BP, discussed her modifiable risk factors, and discussed medication options.    Begin lisinopril HCT daily.  Discussed risks/benefits of medication, f/u 3mo. She understands plan and will begin medication  Cough - finish antibiotic started by Urgent Care, rest, hydrate well, and if worse or not improving, call or return.  Follow up: 3mo on HTN.

## 2012-09-22 LAB — T4, FREE: Free T4: 1.34 ng/dL (ref 0.80–1.80)

## 2012-09-24 ENCOUNTER — Ambulatory Visit: Payer: BC Managed Care – PPO

## 2012-10-02 ENCOUNTER — Encounter: Payer: Self-pay | Admitting: Medical

## 2012-10-02 ENCOUNTER — Ambulatory Visit (INDEPENDENT_AMBULATORY_CARE_PROVIDER_SITE_OTHER): Payer: BC Managed Care – PPO | Admitting: Medical

## 2012-10-02 VITALS — BP 134/90 | HR 76 | Temp 98.3°F | Resp 16 | Wt 162.0 lb

## 2012-10-02 DIAGNOSIS — R05 Cough: Secondary | ICD-10-CM

## 2012-10-02 DIAGNOSIS — J189 Pneumonia, unspecified organism: Secondary | ICD-10-CM

## 2012-10-02 DIAGNOSIS — M542 Cervicalgia: Secondary | ICD-10-CM

## 2012-10-02 DIAGNOSIS — R51 Headache: Secondary | ICD-10-CM

## 2012-10-02 DIAGNOSIS — R059 Cough, unspecified: Secondary | ICD-10-CM

## 2012-10-02 LAB — CBC WITH DIFFERENTIAL/PLATELET
HCT: 39.6 % (ref 36.0–46.0)
Hemoglobin: 13.6 g/dL (ref 12.0–15.0)
Lymphs Abs: 2.7 10*3/uL (ref 0.7–4.0)
MCHC: 34.2 g/dL (ref 30.0–36.0)
Monocytes Absolute: 0.2 10*3/uL (ref 0.1–1.0)
Monocytes Relative: 4 % (ref 3–12)
Neutro Abs: 3.9 10*3/uL (ref 1.7–7.7)
RBC: 4.57 MIL/uL (ref 3.87–5.11)

## 2012-10-02 MED ORDER — DICLOFENAC SODIUM 75 MG PO TBEC: 75.0000 mg | DELAYED_RELEASE_TABLET | Freq: Two times a day (BID) | ORAL | Status: DC

## 2012-10-02 NOTE — Progress Notes (Signed)
Subjective: Here today with interpreter and daughter in law.  I saw her recently 09/21/12 for elevated blood pressure and hospital f/u 2 days after she had been seen at the ED for early pneumonia.   There appears to be confusion.  She apparently never took the antibiotic given by the ED although she is taking the cough medication.  She has her unopened Zpak with her today.  She has hx/o neck pain x 1 year, and I have seen her for this prior.  She did get benefit with diclofenac and flexeril.  She is out of diclofenac thought.   However, currently her main c/o is ongoing neck pain, soreness, pain with ROM.  She also reports headache and ongoing cough.  She has not started her BP medication yet.  She is confused about which medication is for what purpose.  Past Medical History  Diagnosis Date  . Hypertension    Review of Systems Constitutional: +subjective fever, -chills, +sweats, -unexpected weight change, -fatigue ENT: -runny nose, -ear pain, -sore throat Cardiology:  -chest pain, -palpitations, -edema Respiratory: +cough, -shortness of breath, -wheezing Gastroenterology: -abdominal pain, -nausea, -vomiting, -diarrhea, -constipation  Hematology: -bleeding or bruising problems Musculoskeletal: -joint swelling, -back pain Ophthalmology: -vision changes Urology: -dysuria, -difficulty urinating, -hematuria, -urinary frequency, -urgency Neurology: +headache, -weakness, -tingling, -numbness      Objective: Gen: wd, wn, nad Skin: warm/hot and moist on her back, no rash Heent: head nontender, TMs pearly, nares with mild turbinate edema and erythema, pharynx normal Oral: MMM,no lesions Lungs: somewhat decreased lower fields, few rhonchi, but no wheezing, fremitus and egophony normal Heart: RRR, normal S1, S2, no murmurs Abdomen: +bs, soft, nontneder, no organomegaly, no meningeal signs Pulses normal Ext: no edema Neuro: CN2-12 intact, nonfocal exam   Assessment: Encounter Diagnoses  Name  Primary?  . Neck pain Yes  . Cough   . Pneumonia   . Headache    Plan: The etiology of her symptoms today are complicated.  She was seen in the ED recently for early pneumonia.  She feels sweaty and warm today, and although afebrile, she is coughing and still has symptoms suggestive of possible pneumonia.  She did not use the antibiotic.  She has chronic neck pain x 1year.  It is bothering her now.  She also has neck pain with ROM, headache, and reportedly has some photophobia.  after careful evaluation, I felt like the best option today would be to go to the ED for repeat CBC, CXR, neck xray, and possibly even lumbar puncture as I can't fully rule out viral meningitis.  After giving my recommendations, Abrea, her daughter in law, and interpreter went round and round discussing options.   Apparently transportation is an issue today as her daughter in law drove her here, but can't occupy her car all afternoon for Liese, they are concerned about translator availability at the ED, and she is worried about insurance coverage for the visit.  I tried to reassure that the ED would have available translators, that we can work out transportation in some form another, and tried to reassure that insurance should cover a good portion of charges just like they apparently have done for the recent prior visits.  Advised of my concern for differential diagnosis.  ultimately she refused to go to the ED despite the risks.  I offered to send for labs and CXR now knowing that she would have to have the LP in the emergency dept if things worsen, but she declines xrays today as  outpatient.  She did agree to stat CBC.    At this point I advised she begin the Zpak to cover for pneumonia, rest, hydrate well, can c/t Diclofenac for neck pain and headache as she was doing prior, and advised if no improvement in the next 24 hours, strongly consider going to the ED.  I will await her call back.   Spent >1 hour face to face.  discussed case  with supervising physician Dr. Susann Givens today as well.

## 2012-10-03 ENCOUNTER — Encounter: Payer: Self-pay | Admitting: Medical

## 2012-10-26 ENCOUNTER — Ambulatory Visit: Payer: BC Managed Care – PPO

## 2012-11-05 ENCOUNTER — Telehealth: Payer: Self-pay | Admitting: *Deleted

## 2012-11-05 NOTE — Telephone Encounter (Signed)
Patient came into office requesting a refill on her cyclobenzaprine 10mg . States that when she was taking the medication she was not having any neck pain. Now that the medication is gone she is having the neck pain again. Would like refill sent to [facility] [address] Also if you need to call patient tomorrow 11/06/12 can you please call between 11:15 and 11:45 am, this is the only time her husband will be available. Thanks.

## 2012-11-06 ENCOUNTER — Other Ambulatory Visit: Payer: Self-pay | Admitting: Medical

## 2012-11-06 MED ORDER — CYCLOBENZAPRINE HCL 10 MG PO TABS: 10.0000 mg | ORAL_TABLET | Freq: Every evening | ORAL | Status: DC | PRN

## 2012-11-06 NOTE — Telephone Encounter (Signed)
pls call, but remember she doesn't speak english.     I sent muscle relaxer Flexeril/Cyclobenzaprine Friday evening.   Did we refer/did she ever go see physical therapy?  If not, set this up.

## 2012-11-10 NOTE — Telephone Encounter (Signed)
I called and spoke with the patients daughter about the medication at the pharmacy for this patient. CLS

## 2012-11-11 ENCOUNTER — Telehealth: Payer: Self-pay | Admitting: Family Medicine

## 2012-11-11 NOTE — Telephone Encounter (Signed)
Patient is aware of her appointment at [facility] on 11/19/12 @ 530 pm. CLS [phone]

## 2013-01-29 ENCOUNTER — Encounter: Payer: Self-pay | Admitting: Family Medicine

## 2013-01-29 ENCOUNTER — Ambulatory Visit (INDEPENDENT_AMBULATORY_CARE_PROVIDER_SITE_OTHER): Payer: BC Managed Care – PPO | Admitting: Family Medicine

## 2013-01-29 VITALS — BP 150/110 | HR 80 | Wt 156.0 lb

## 2013-01-29 DIAGNOSIS — R51 Headache: Secondary | ICD-10-CM

## 2013-01-29 DIAGNOSIS — R519 Headache, unspecified: Secondary | ICD-10-CM

## 2013-01-29 DIAGNOSIS — I10 Essential (primary) hypertension: Secondary | ICD-10-CM

## 2013-01-29 MED ORDER — LISINOPRIL-HYDROCHLOROTHIAZIDE 10-12.5 MG PO TABS: 1.0000 | ORAL_TABLET | Freq: Every day | ORAL | Status: DC

## 2013-01-29 NOTE — Progress Notes (Signed)
  Subjective:    Patient ID: Becky Goodwin, female    DOB: 1965-09-11, 47 y.o.   MRN: [id number]  HPI She is here for evaluation of continued difficulty with headache and right-sided arm and leg pain. This has been going on for over a year. She has tried OTC meds intermittently. She has not started taking her blood pressure medication due to confusion on whether she had high blood pressure not. She was also scheduled for physical therapy but has not done this due to job constraints.   Review of Systems     Objective:   Physical Exam alert and in no distress. Tympanic membranes and canals are normal. Throat is clear. Tonsils are normal. Neck is supple without adenopathy or thyromegaly. Cardiac exam shows a regular sinus rhythm without murmurs or gallops. Lungs are clear to auscultation.        Assessment & Plan:  Hypertension - Plan: lisinopril-hydrochlorothiazide (PRINZIDE,ZESTORETIC) 10-12.5 MG per tablet  Headache there was obviously a communication issue since she didn't start the blood pressure medication. I called him again and encouraged him to take it in the morning. Cautioned on increased urination. We'll also have her take Advil on a regular basis for the next several weeks and see if this will help with the headache as well as the generalized aches and pains. Turned here in one month for recheck to

## 2013-01-29 NOTE — Patient Instructions (Signed)
Take 4 Advil 3 times per day regularly for the next 2 weeks. Return here in one month

## 2013-03-05 ENCOUNTER — Ambulatory Visit
Admission: RE | Admit: 2013-03-05 | Discharge: 2013-03-05 | Disposition: A | Discharge status: Home / Self Care | Payer: BC Managed Care – PPO | Source: Ambulatory Visit | Attending: Internal Medicine | Admitting: Internal Medicine

## 2013-03-05 ENCOUNTER — Ambulatory Visit (INDEPENDENT_AMBULATORY_CARE_PROVIDER_SITE_OTHER): Payer: BC Managed Care – PPO | Admitting: Medical

## 2013-03-05 ENCOUNTER — Encounter: Payer: Self-pay | Admitting: Medical

## 2013-03-05 VITALS — BP 108/80 | HR 72 | Temp 97.9°F | Resp 16 | Wt 157.0 lb

## 2013-03-05 DIAGNOSIS — R51 Headache: Secondary | ICD-10-CM

## 2013-03-05 DIAGNOSIS — Z1231 Encounter for screening mammogram for malignant neoplasm of breast: Secondary | ICD-10-CM

## 2013-03-05 DIAGNOSIS — M542 Cervicalgia: Secondary | ICD-10-CM

## 2013-03-05 DIAGNOSIS — I10 Essential (primary) hypertension: Secondary | ICD-10-CM

## 2013-03-05 MED ORDER — LISINOPRIL-HYDROCHLOROTHIAZIDE 10-12.5 MG PO TABS: 1.0000 | ORAL_TABLET | Freq: Every day | ORAL | Status: DC

## 2013-03-05 MED ORDER — CYCLOBENZAPRINE HCL 10 MG PO TABS: 10.0000 mg | ORAL_TABLET | Freq: Every evening | ORAL | Status: DC | PRN

## 2013-03-05 NOTE — Patient Instructions (Signed)
Refills today: Lisinopril HCT and Flexeril.  Instructions:  Lisinopril HCT blood pressure medication.  Continue this every day in the morning.  I sent refill to your pharmacy for 1 year.  You can continue Advil AS NEEDED, up to 3 tablets, once to three times daily, but not daily.  Neck and shoulder pain - you can use Advil and or night time muscle relaxer Flexeril when needed.    Return in 6 months unless you need to return sooner.

## 2013-03-05 NOTE — Progress Notes (Signed)
Subjective:  Becky Goodwin is a 47 y.o. female who presents for f/u.  Here with interpreter.   Last month saw Dr. Susann Givens, and apparently she was confused over BP medications.  She did start the Lisinopril HCT 10/12.5mg  after last visit.  She is taking BP medication without c/o, BP as [employer] machine has been normal.  Headaches have resolved, neck and shoulder pain has resolved.  Has been using Advil up to TID regular;ly though.  Never went to see PT and doesn't want to go at this point.  No other aggravating or relieving factors.    No other c/o.  The following portions of the patient's history were reviewed and updated as appropriate: allergies, current medications, past family history, past medical history, past social history, past surgical history and problem list.  ROS Otherwise as in subjective above  Objective: Physical Exam  Vital signs reviewed  General appearance: alert, no distress, WD/WN Neck: supple, no lymphadenopathy, no thyromegaly, no masses, nontender, normal ROM Heart: RRR, normal S1, S2, no murmurs Lungs: CTA bilaterally, no wheezes, rhonchi, or rales Pulses: 2+ radial pulses, 2+ pedal pulses, normal cap refill Ext: no edema   Assessment: Encounter Diagnoses  Name Primary?  . Essential hypertension, benign Yes  . Neck pain   . Headache(784.0)   . Hypertension      Plan: HTN - much improved, c/t same medication, refills today.  Discussed that this is ongoing medication.   BMET today. Neck pain - resolved.  Can use Advil and or flexeril prn. Headache - resolved, discussed prevention, limit caffeine, avoid rebound headaches.  Follow up: 47mo, sooner prn

## 2013-03-06 LAB — BASIC METABOLIC PANEL
BUN: 11 mg/dL (ref 6–23)
Calcium: 9.4 mg/dL (ref 8.4–10.5)
Creat: 0.64 mg/dL (ref 0.50–1.10)
Glucose, Bld: 89 mg/dL (ref 70–99)
Potassium: 4.2 mEq/L (ref 3.5–5.3)

## 2013-03-08 ENCOUNTER — Encounter: Payer: Self-pay | Admitting: Internal Medicine

## 2013-04-16 ENCOUNTER — Telehealth: Payer: Self-pay | Admitting: Family Medicine

## 2013-04-16 NOTE — Telephone Encounter (Signed)
I left a voicemail about the mammogram results. CLS

## 2013-04-16 NOTE — Telephone Encounter (Signed)
Message copied by Janeice Robinson on Fri Apr 16, 2013  8:39 AM ------      Message from: Aleen Campi, DAVID S      Created: Wed Apr 14, 2013  7:50 AM       Mammogram 03/16/13 normal ------

## 2013-04-29 ENCOUNTER — Encounter: Payer: Self-pay | Admitting: Medical

## 2013-12-10 ENCOUNTER — Ambulatory Visit (INDEPENDENT_AMBULATORY_CARE_PROVIDER_SITE_OTHER): Payer: BC Managed Care – PPO | Admitting: Family Medicine

## 2013-12-10 ENCOUNTER — Institutional Professional Consult (permissible substitution): Payer: BC Managed Care – PPO | Admitting: Medical

## 2013-12-10 ENCOUNTER — Encounter: Payer: Self-pay | Admitting: Family Medicine

## 2013-12-10 VITALS — BP 120/88 | HR 68 | Temp 97.9°F | Resp 16 | Ht 64.0 in | Wt 159.0 lb

## 2013-12-10 DIAGNOSIS — M67919 Unspecified disorder of synovium and tendon, unspecified shoulder: Secondary | ICD-10-CM

## 2013-12-10 DIAGNOSIS — M719 Bursopathy, unspecified: Secondary | ICD-10-CM

## 2013-12-10 DIAGNOSIS — M7551 Bursitis of right shoulder: Secondary | ICD-10-CM

## 2013-12-10 DIAGNOSIS — I10 Essential (primary) hypertension: Secondary | ICD-10-CM

## 2013-12-10 MED ORDER — LISINOPRIL-HYDROCHLOROTHIAZIDE 10-12.5 MG PO TABS: 1.0000 | ORAL_TABLET | Freq: Every day | ORAL | Status: DC

## 2013-12-10 MED ORDER — LIDOCAINE 1 % OPTIME INJ - NO CHARGE
3.0000 mL | Freq: Once | INTRAMUSCULAR | Status: AC
  Administered 2013-12-10: 3 mL

## 2013-12-10 MED ORDER — TRIAMCINOLONE ACETONIDE 40 MG/ML IJ SUSP
40.0000 mg | Freq: Once | INTRAMUSCULAR | Status: AC
  Administered 2013-12-10: 40 mg via INTRAMUSCULAR

## 2013-12-10 NOTE — Progress Notes (Signed)
   Subjective:    Patient ID: Lonzo Cloudup Schiro, female    DOB: 09/01/1965, 48 y.o.   MRN: [id number]  HPI She is here with her husband who speaks better English than she does. They're here for followup on blood pressure. They think that she can potentially stop her medication. She also has had a several year history of right shoulder pain. History was difficult to obtain but apparently this does interfere with her ability to lift however she does continue to work. No popping locking or grinding.   Review of Systems     Objective:   Physical Exam Alert and in no distress. Blood pressure is recorded. Right shoulder exam shows full motion. Negative drop arm test. Near his and Hawkins test negative. Questionable palpable tenderness over the entire shoulder.       Assessment & Plan:  Hypertension - Plan: lisinopril-hydrochlorothiazide (PRINZIDE,ZESTORETIC) 10-12.5 MG per tablet, lidocaine (XYLOCAINE) injection 1 % - NO CHARGE, triamcinolone acetonide (KENALOG-40) injection 40 mg  Bursitis of right shoulder - Plan: lidocaine (XYLOCAINE) injection 1 % - NO CHARGE, triamcinolone acetonide (KENALOG-40) injection 40 mg  I reinforced the fact that the need to continue on the blood pressure medication that we control this not cure it. It was difficult to determine exactly the cause of her shoulder pain due to communication. I decided to inject her with Kenalog and Xylocaine. The shoulder was prepped with Betadine and 40 mg of Kenalog and 3 cc of Xylocaine was injected into the subacromial bursa without difficulty. They're instructed to return here if the problem continues.

## 2013-12-14 ENCOUNTER — Institutional Professional Consult (permissible substitution): Payer: BC Managed Care – PPO | Admitting: Medical

## 2013-12-22 ENCOUNTER — Telehealth: Payer: Self-pay | Admitting: Medical

## 2013-12-22 ENCOUNTER — Encounter: Payer: Self-pay | Admitting: Medical

## 2013-12-22 ENCOUNTER — Other Ambulatory Visit (HOSPITAL_COMMUNITY)
Admission: RE | Admit: 2013-12-22 | Discharge: 2013-12-22 | Disposition: A | Discharge status: Home / Self Care | Payer: BC Managed Care – PPO | Source: Ambulatory Visit | Attending: Medical | Admitting: Medical

## 2013-12-22 ENCOUNTER — Ambulatory Visit (INDEPENDENT_AMBULATORY_CARE_PROVIDER_SITE_OTHER): Payer: BC Managed Care – PPO | Admitting: Medical

## 2013-12-22 VITALS — BP 130/78 | HR 76 | Temp 98.0°F | Resp 18 | Wt 155.0 lb

## 2013-12-22 DIAGNOSIS — Z01419 Encounter for gynecological examination (general) (routine) without abnormal findings: Secondary | ICD-10-CM | POA: Insufficient documentation

## 2013-12-22 DIAGNOSIS — Z1231 Encounter for screening mammogram for malignant neoplasm of breast: Secondary | ICD-10-CM

## 2013-12-22 DIAGNOSIS — Z124 Encounter for screening for malignant neoplasm of cervix: Secondary | ICD-10-CM

## 2013-12-22 DIAGNOSIS — M25519 Pain in unspecified shoulder: Secondary | ICD-10-CM

## 2013-12-22 DIAGNOSIS — Z01411 Encounter for gynecological examination (general) (routine) with abnormal findings: Secondary | ICD-10-CM

## 2013-12-22 DIAGNOSIS — Z0000 Encounter for general adult medical examination without abnormal findings: Secondary | ICD-10-CM

## 2013-12-22 DIAGNOSIS — Z1151 Encounter for screening for human papillomavirus (HPV): Secondary | ICD-10-CM | POA: Insufficient documentation

## 2013-12-22 DIAGNOSIS — R7301 Impaired fasting glucose: Secondary | ICD-10-CM

## 2013-12-22 DIAGNOSIS — I10 Essential (primary) hypertension: Secondary | ICD-10-CM

## 2013-12-22 DIAGNOSIS — M25512 Pain in left shoulder: Secondary | ICD-10-CM

## 2013-12-22 DIAGNOSIS — E786 Lipoprotein deficiency: Secondary | ICD-10-CM

## 2013-12-22 LAB — CBC
HEMATOCRIT: 38.1 % (ref 36.0–46.0)
Hemoglobin: 12.8 g/dL (ref 12.0–15.0)
MCH: 29.4 pg (ref 26.0–34.0)
MCHC: 33.6 g/dL (ref 30.0–36.0)
MCV: 87.6 fL (ref 78.0–100.0)
Platelets: 315 10*3/uL (ref 150–400)
RBC: 4.35 MIL/uL (ref 3.87–5.11)
RDW: 13.7 % (ref 11.5–15.5)
WBC: 11.8 10*3/uL — ABNORMAL HIGH (ref 4.0–10.5)

## 2013-12-22 NOTE — Patient Instructions (Signed)
  Thank you for giving me the opportunity to serve you today.    Your diagnosis today includes: Encounter Diagnoses  Name Primary?  . Routine general medical examination at a health care facility Yes  . Essential hypertension, benign   . Impaired fasting blood sugar   . Low HDL (under 40)   . Shoulder pain, left   . Screening for cervical cancer      Specific recommendations today include:  We will call with lab results and pap smear results  Go for mammogram this July  Consider seeing a dentist yearly for routine dental care  Continue your blood pressure medication daily as usual  We will refer you to gynecologist for abnormal exam/polyp on exam of the cervix  You will need a screening colonoscopy beginning age 48 years old  I recommend a yearly influenza vaccine in September of each year  Return pending labs.

## 2013-12-22 NOTE — Telephone Encounter (Signed)
Refer to gynecology for abnormal findings on pelvic exam, make sure they're aware that she will need translator there with her. Send Pap smear results, labs, office note  Set up repeat mammogram for July

## 2013-12-22 NOTE — Telephone Encounter (Signed)
i have set pt up on Thursday may 21th @ 3:00 with Dr. Beatrix Shipperfernadez @ [facility] and set pt up for a mammogram July 28th @ 10:00am at the breast center  Sent them a letter in the mail per husband request with appointments

## 2013-12-22 NOTE — Progress Notes (Signed)
Subjective:   HPI  Becky Goodwin is a 48 y.o. female who presents for a complete physical.  Here with translator.   Preventative care: Last ophthalmology visit: n/a Last dental visit: n/a, none in a while Last colonoscopy: never  Last mammogram: 02/2013 Last gynecological exam: 4 years ago Last EKG: n/a Last labs: 2014  Prior vaccinations: TD or Tdap: 2011 Influenza: declines Pneumococcal: n/a Shingles/Zostavax: n/a  Concerns: None, wants a physical, routine screening.  Reviewed their medical, surgical, family, social, medication, and allergy history and updated chart as appropriate.  Past Medical History  Diagnosis Date  . Hypertension   . Chronic neck pain   . Shoulder pain   . H/O mammogram 02/2013    normal    Past Surgical History  Procedure Laterality Date  . Tonsillectomy      Right tonsillectomy, 2wk hospitalization ([facility])  . Cyst excision      upper chest/ breast ([facility]), 1 week hospitalization  . Jejunostomy feeding tube      prior with surgery    History   Social History  . Marital Status: Married    Spouse Name: N/A    Number of Children: N/A  . Years of Education: N/A   Occupational History  . Not on file.   Social History Main Topics  . Smoking status: Never Smoker   . Smokeless tobacco: Not on file  . Alcohol Use: No  . Drug Use: No  . Sexual Activity: Yes    Birth Control/ Protection: None   Other Topics Concern  . 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 Hypertension Father   . Cancer Neg Hx   . Diabetes Neg Hx   . Heart disease Neg Hx   . Stroke Neg Hx     Current outpatient prescriptions:lisinopril-hydrochlorothiazide (PRINZIDE,ZESTORETIC) 10-12.5 MG per tablet, Take 1 tablet by mouth daily., Disp: 90 tablet, Rfl: 3;  cyclobenzaprine (FLEXERIL) 10 MG tablet,  Take 1 tablet (10 mg total) by mouth at bedtime as needed for muscle spasms., Disp: 30 tablet, Rfl: 1  No Known Allergies   Review of Systems Constitutional: -fever, -chills, -sweats, -unexpected weight change, -decreased appetite, -fatigue Allergy: -sneezing, -itching, -congestion Dermatology: -changing moles, --rash, -lumps ENT: -runny nose, -ear pain, -sore throat, -hoarseness, -sinus pain, -teeth pain, - ringing in ears, -hearing loss, -nosebleeds Cardiology: -chest pain, -palpitations, -swelling, -difficulty breathing when lying flat, -waking up short of breath Respiratory: -cough, -shortness of breath, -difficulty breathing with exercise or exertion, -wheezing, -coughing up blood Gastroenterology: -abdominal pain, -nausea, -vomiting, -diarrhea, -constipation, -blood in stool, -changes in bowel movement, -difficulty swallowing or eating Hematology: -bleeding, -bruising  Musculoskeletal: -joint aches, -muscle aches, -joint swelling, -back pain, -neck pain, -cramping, -changes in gait Ophthalmology: denies vision changes, eye redness, itching, discharge Urology: -burning with urination, -difficulty urinating, -blood in urine, -urinary frequency, -urgency, -incontinence Neurology: -headache, -weakness, -tingling, -numbness, -memory loss, -falls, -dizziness Psychology: -depressed mood, -agitation, -sleep problems     Objective:   Physical Exam  BP 130/78  Pulse 76  Temp(Src) 98 F (36.7 C) (Oral)  Resp 18  Wt 155 lb (70.308 kg)  LMP 10/06/2012  General appearance: alert, no distress, WD/WN, [place] female Skin: left cheek with round brown 2mm papule , unchnaged per patient, scattered benign appearing macules, no worrisome lesions HEENT: normocephalic, conjunctiva/corneas  normal, sclerae anicteric, PERRLA, EOMi, nares patent, no discharge or erythema, pharynx normal Oral cavity: MMM, tongue normal, teeth in good repair, some stain Neck: supple, no lymphadenopathy, no thyromegaly,  no masses, normal ROM, no bruits Chest: non tender, normal shape and expansion Heart: RRR, normal S1, S2, no murmurs Lungs: CTA bilaterally, no wheezes, rhonchi, or rales Abdomen: +bs, soft, non tender, non distended, no masses, no hepatomegaly, no splenomegaly, no bruits Back: non tender, normal ROM, no scoliosis Musculoskeletal: upper extremities non tender, no obvious deformity, normal ROM throughout, lower extremities non tender, no obvious deformity, normal ROM throughout Extremities: no edema, no cyanosis, no clubbing Pulses: 2+ symmetric, upper and lower extremities, normal cap refill Neurological: alert, oriented x 3, CN2-12 intact, strength normal upper extremities and lower extremities, sensation normal throughout, DTRs 2+ throughout, no cerebellar signs, gait normal Psychiatric: normal affect, behavior normal, pleasant  Breast: Right breast at 11:00 position with horizontal small 1 cm surgical scar from prior lump/cyst removal, otherwise nontender, no masses or lumps, no skin changes, no nipple discharge or inversion, no axillary lymphadenopathy Gyn: Normal external genitalia without lesions, vagina with normal mucosa, there appears to be a small polyp at the 7:00 position of the cervix, the cervix appears enlarged with some erythema, no cervical motion tenderness, no abnormal vaginal discharge.  Uterus seems enlarged, adnexa not enlarged, nontender, no masses.  Pap performed.  Exam chaperoned by nurse. Rectal: deferred    Assessment and Plan :    Encounter Diagnoses  Name Primary?  . Routine general medical examination at a health care facility Yes  . Essential hypertension, benign   . Impaired fasting blood sugar   . Low HDL (under 40)   . Shoulder pain, left     Physical exam - discussed healthy lifestyle, diet, exercise, preventative care, vaccinations, and addressed their concerns.  Handout given. We discussed the abnormalities on her pelvic exam, Pap smear sent, we'll  refer to gynecology for further evaluation and management Hypertension-continue same medication as usual Fasting labs today, reviewed labs from 2014 which showed some abnormality Shoulder pain and neck pain much improved after steroidal injection from Dr. Susann GivensLalonde here recently Follow-up pending labs

## 2013-12-23 LAB — COMPREHENSIVE METABOLIC PANEL
ALBUMIN: 4.2 g/dL (ref 3.5–5.2)
ALT: 16 U/L (ref 0–35)
AST: 18 U/L (ref 0–37)
Alkaline Phosphatase: 78 U/L (ref 39–117)
BUN: 16 mg/dL (ref 6–23)
CALCIUM: 9.4 mg/dL (ref 8.4–10.5)
CHLORIDE: 101 meq/L (ref 96–112)
CO2: 30 mEq/L (ref 19–32)
Creat: 0.67 mg/dL (ref 0.50–1.10)
GLUCOSE: 77 mg/dL (ref 70–99)
POTASSIUM: 4.1 meq/L (ref 3.5–5.3)
Sodium: 139 mEq/L (ref 135–145)
Total Bilirubin: 0.7 mg/dL (ref 0.2–1.2)
Total Protein: 7 g/dL (ref 6.0–8.3)

## 2013-12-23 LAB — HEMOGLOBIN A1C
Hgb A1c MFr Bld: 5.8 % — ABNORMAL HIGH (ref <5.7)
Mean Plasma Glucose: 120 mg/dL — ABNORMAL HIGH (ref <117)

## 2013-12-23 LAB — TSH: TSH: 0.081 u[IU]/mL — ABNORMAL LOW (ref 0.350–4.500)

## 2013-12-23 LAB — LIPID PANEL
CHOLESTEROL: 188 mg/dL (ref 0–200)
HDL: 48 mg/dL (ref >39)
LDL Cholesterol: 114 mg/dL — ABNORMAL HIGH (ref 0–99)
TRIGLYCERIDES: 131 mg/dL (ref <150)
Total CHOL/HDL Ratio: 3.9 Ratio
VLDL: 26 mg/dL (ref 0–40)

## 2013-12-23 LAB — T4, FREE: Free T4: 1.45 ng/dL (ref 0.80–1.80)

## 2013-12-27 ENCOUNTER — Encounter: Payer: Self-pay | Admitting: Family Medicine

## 2013-12-30 ENCOUNTER — Ambulatory Visit: Payer: BC Managed Care – PPO | Admitting: Gynecology

## 2013-12-31 ENCOUNTER — Telehealth: Payer: Self-pay | Admitting: Medical

## 2013-12-31 NOTE — Telephone Encounter (Signed)
Family wants to know what foods and fruits the pt should be or should not be eating.  Also what vitamins to be use/eaten by pt.  He also wants to know what her blood test results mean that were mailed to pt.  What does it mean to be borderline diabetic?  Asked for any information or brochures that could be mailed to them also. Thanks

## 2014-01-04 NOTE — Telephone Encounter (Signed)
In general, limit portion sizes, eat healthy foods - fruits, vegetables, can c/t rice, but limit to 1 cup per meal.  I suspect she is eating healthy, and this also may be in part to the recent shoulder steroid injection which can temporarily raise glucose.  In general I recommend she take a daily multivitamin such as Centrum for her age, and take 1000 IU Vit D OTC daily.  C/t as planned with gynecology referral.

## 2014-01-05 NOTE — Telephone Encounter (Signed)
I mailed the patient the information. CLS

## 2014-01-06 ENCOUNTER — Encounter: Payer: Self-pay | Admitting: Medical

## 2014-01-18 ENCOUNTER — Ambulatory Visit: Payer: BC Managed Care – PPO | Admitting: Gynecology

## 2014-01-18 ENCOUNTER — Ambulatory Visit: Payer: BC Managed Care – PPO | Admitting: Women's Health

## 2014-03-08 ENCOUNTER — Ambulatory Visit: Payer: BC Managed Care – PPO

## 2014-03-16 ENCOUNTER — Ambulatory Visit: Payer: BC Managed Care – PPO

## 2014-03-25 ENCOUNTER — Ambulatory Visit
Admission: RE | Admit: 2014-03-25 | Discharge: 2014-03-25 | Disposition: A | Discharge status: Home / Self Care | Payer: 59 | Source: Ambulatory Visit | Attending: Medical | Admitting: Medical

## 2014-03-25 DIAGNOSIS — Z1231 Encounter for screening mammogram for malignant neoplasm of breast: Secondary | ICD-10-CM

## 2014-03-31 ENCOUNTER — Encounter: Payer: Self-pay | Admitting: Internal Medicine

## 2014-05-03 ENCOUNTER — Emergency Department (HOSPITAL_COMMUNITY)
Admission: EM | Admit: 2014-05-03 | Discharge: 2014-05-03 | Disposition: A | Discharge status: Home / Self Care | Payer: 59 | Source: Home / Self Care | Attending: Family Medicine | Admitting: Family Medicine

## 2014-05-03 ENCOUNTER — Encounter (HOSPITAL_COMMUNITY): Payer: Self-pay | Admitting: Emergency Medicine

## 2014-05-03 DIAGNOSIS — M719 Bursopathy, unspecified: Secondary | ICD-10-CM

## 2014-05-03 DIAGNOSIS — M7551 Bursitis of right shoulder: Secondary | ICD-10-CM

## 2014-05-03 DIAGNOSIS — M67919 Unspecified disorder of synovium and tendon, unspecified shoulder: Secondary | ICD-10-CM

## 2014-05-03 MED ORDER — GI COCKTAIL ~~LOC~~
30.0000 mL | Freq: Once | ORAL | Status: AC
  Administered 2014-05-03: 30 mL via ORAL

## 2014-05-03 MED ORDER — ONDANSETRON 4 MG PO TBDP
8.0000 mg | ORAL_TABLET | Freq: Once | ORAL | Status: AC
  Administered 2014-05-03: 8 mg via ORAL

## 2014-05-03 MED ORDER — ONDANSETRON 4 MG PO TBDP
ORAL_TABLET | ORAL | Status: AC
  Filled 2014-05-03: qty 2

## 2014-05-03 MED ORDER — ONDANSETRON HCL 4 MG PO TABS: 4.0000 mg | ORAL_TABLET | Freq: Four times a day (QID) | ORAL | Status: DC

## 2014-05-03 MED ORDER — GI COCKTAIL ~~LOC~~
ORAL | Status: AC
  Filled 2014-05-03: qty 30

## 2014-05-03 MED ORDER — DICLOFENAC SODIUM 1 % TD GEL: 4.0000 g | Freq: Four times a day (QID) | TRANSDERMAL | Status: DC

## 2014-05-03 NOTE — Discharge Instructions (Signed)
Ice and medicine as needed. See orthopedist if further problems.

## 2014-05-03 NOTE — ED Notes (Signed)
Pt reports emesis since this morning, reports very sleepy per husband, pt is not complaining of any pain at this time

## 2014-05-03 NOTE — ED Provider Notes (Signed)
CSN: [id number]     Arrival date & time 05/03/14  1103 History   First MD Initiated Contact with Patient 05/03/14 1225     Chief Complaint  Patient presents with  . Emesis   (Consider location/radiation/quality/duration/timing/severity/associated sxs/prior Treatment) Patient is a 48 y.o. female presenting with vomiting. The history is provided by the patient and the spouse.  Emesis Severity:  Mild Duration:  4 hours Number of daily episodes:  1 Quality:  Stomach contents Progression:  Improving Chronicity:  New (took percocet tab from husband last eve, sx began this am.) Exacerbated by: pt feels fine if not eating, worsened by eating. Ineffective treatments:  None tried Associated symptoms: no abdominal pain, no chills, no cough, no diarrhea, no fever and no sore throat   Risk factors: not pregnant now and no sick contacts   Risk factors comment:  Suspect medicine trigger.   Past Medical History  Diagnosis Date  . Hypertension   . Chronic neck pain   . Shoulder pain   . H/O mammogram 02/2013    normal   Past Surgical History  Procedure Laterality Date  . Tonsillectomy      Right tonsillectomy, 2wk hospitalization ([facility])  . Cyst excision      upper chest/ breast ([facility]), 1 week hospitalization  . Jejunostomy feeding tube      prior with surgery   Family History  Problem Relation Age of Onset  . Hypertension Father   . Cancer Neg Hx   . Diabetes Neg Hx   . Heart disease Neg Hx   . Stroke Neg Hx    History  Substance Use Topics  . Smoking status: Never Smoker   . Smokeless tobacco: Not on file  . Alcohol Use: No   OB History   Grav Para Term Preterm Abortions TAB SAB Ect Mult Living                 Review of Systems  Constitutional: Negative.  Negative for chills.  HENT: Negative.  Negative for sore throat.   Gastrointestinal: Positive for nausea and vomiting. Negative for abdominal pain and diarrhea.  Musculoskeletal: Positive for joint swelling.  Skin:  Negative.     Allergies  Review of patient's allergies indicates no known allergies.  Home Medications   Prior to Admission medications   Medication Sig Start Date End Date Taking? Authorizing Provider  lisinopril-hydrochlorothiazide (PRINZIDE,ZESTORETIC) 10-12.5 MG per tablet Take 1 tablet by mouth daily. 12/10/13  Yes Ronnald Nian, MD  cyclobenzaprine (FLEXERIL) 10 MG tablet Take 1 tablet (10 mg total) by mouth at bedtime as needed for muscle spasms. 03/05/13   Kermit Balo Tysinger, PA-C  diclofenac sodium (VOLTAREN) 1 % GEL Apply 4 g topically 4 (four) times daily. To shoulder area 05/03/14   Linna Hoff, MD  ondansetron (ZOFRAN) 4 MG tablet Take 1 tablet (4 mg total) by mouth every 6 (six) hours. Prn n/v 05/03/14   Linna Hoff, MD   BP 122/85  Pulse 69  Temp(Src) 97.8 F (36.6 C) (Oral)  Resp 14  SpO2 97%  LMP 10/06/2012 Physical Exam  Nursing note and vitals reviewed. Constitutional: She is oriented to person, place, and time. She appears well-developed and well-nourished.  HENT:  Mouth/Throat: Oropharynx is clear and moist.  Eyes: Conjunctivae are normal. Pupils are equal, round, and reactive to light.  Neck: Normal range of motion. Neck supple.  Cardiovascular: Regular rhythm, normal heart sounds and intact distal pulses.   Pulmonary/Chest: Effort normal and  breath sounds normal.  Abdominal: Soft. Bowel sounds are normal. She exhibits no distension and no mass. There is no tenderness. There is no rebound and no guarding.  Musculoskeletal: She exhibits tenderness.       Right shoulder: She exhibits tenderness and bony tenderness. She exhibits normal range of motion, no swelling, no crepitus, no deformity, normal pulse and normal strength.  Lymphadenopathy:    She has no cervical adenopathy.  Neurological: She is alert and oriented to person, place, and time.  Skin: Skin is warm and dry.    ED Course  Procedures (including critical care time) Labs Review Labs Reviewed -  No data to display  Imaging Review No results found.   MDM   1. Bursitis, shoulder, right        Linna Hoff, MD 05/03/14 [phone]

## 2014-07-21 ENCOUNTER — Encounter: Payer: Self-pay | Admitting: Medical

## 2014-07-21 ENCOUNTER — Ambulatory Visit
Admission: RE | Admit: 2014-07-21 | Discharge: 2014-07-21 | Disposition: A | Discharge status: Home / Self Care | Payer: 59 | Source: Ambulatory Visit | Attending: Medical | Admitting: Medical

## 2014-07-21 ENCOUNTER — Ambulatory Visit (INDEPENDENT_AMBULATORY_CARE_PROVIDER_SITE_OTHER): Payer: 59 | Admitting: Medical

## 2014-07-21 VITALS — BP 126/78 | HR 82 | Temp 98.1°F | Resp 15 | Wt 153.0 lb

## 2014-07-21 DIAGNOSIS — R200 Anesthesia of skin: Secondary | ICD-10-CM

## 2014-07-21 DIAGNOSIS — Z23 Encounter for immunization: Secondary | ICD-10-CM

## 2014-07-21 DIAGNOSIS — M25511 Pain in right shoulder: Secondary | ICD-10-CM

## 2014-07-21 DIAGNOSIS — R202 Paresthesia of skin: Secondary | ICD-10-CM

## 2014-07-21 DIAGNOSIS — M79601 Pain in right arm: Secondary | ICD-10-CM

## 2014-07-21 DIAGNOSIS — M542 Cervicalgia: Secondary | ICD-10-CM

## 2014-07-21 MED ORDER — HYDROCODONE-ACETAMINOPHEN 7.5-325 MG PO TABS: 1.0000 | ORAL_TABLET | Freq: Four times a day (QID) | ORAL | Status: DC | PRN

## 2014-07-21 NOTE — Progress Notes (Signed)
Subjective: Here with interpreter today [facility]y [facility] from [employer].   She reports ongoing right shoulder, arm, and chest pain.  Has been having problems with this the last 3 years .  Last 3 days, worse.  Has pain day and night.  Decreased grip strength, hard to grip blanket at night.  The right shoulder aches at night.  Has neck pain, upper right back pain upper right chest pain. She reports intermittent numbness in the right arm throughout, tingling, pain that goes all way down to the hand. She reports neck pains chronic. Nonspecific prior injury or trauma.  No prior steroid procedures and neck but did have a steroid shot in her shoulder here a few months ago bout a month of relief.  She is use various NSAIDs, Ultram, using nothing currently. No other aggravating or relieving factors. No other complaint.  Review of systems as in subjective  Objective: Gen: wd, wn, nad Skin: unremarkable, no erythema, warmth, ecchymosis MSK: Tender over right AC joint, tender over right biceps origin, mild tenderness over the right arm in general, no swelling, no obvious deformity, pain with passive and active range of motion over 90, pain with apprehension test, pain with with any range of motion over 90, no obvious laxity, rest of arm exam unremarkable Neck: Right lateral neck tender, mild pain with range of motion of neck which is mildly reduced in general ROM and no mass no thyromegaly no lymphadenopathy Arms neurovascularly intact Back: Tender right upper back paraspinal, positive spasm, otherwise back nontender    Assessment: Encounter Diagnoses  Name Primary?  . Right shoulder pain Yes  . Right arm pain   . Numbness and tingling of right arm   . Neck pain   . Need for prophylactic vaccination and inoculation against influenza     Plan: Reviewed prior records where she has been seen for the same issue. We'll send for x-rays of neck and shoulder.  Prescription for hydrocodone when  necessary use.  We'll likely refer to orthopedics or potentially get MRI depending on the x-ray results.  Counseled on the influenza virus vaccine.  Vaccine information sheet given.  Influenza vaccine given after consent obtained.  F/u pending xrays

## 2014-09-13 ENCOUNTER — Telehealth: Payer: Self-pay | Admitting: Medical

## 2014-09-13 NOTE — Telephone Encounter (Signed)
err

## 2014-10-31 ENCOUNTER — Encounter: Payer: Self-pay | Admitting: Medical

## 2014-10-31 ENCOUNTER — Ambulatory Visit (INDEPENDENT_AMBULATORY_CARE_PROVIDER_SITE_OTHER): Payer: 59 | Admitting: Medical

## 2014-10-31 VITALS — BP 100/60 | HR 84 | Temp 98.1°F | Resp 15 | Wt 154.0 lb

## 2014-10-31 DIAGNOSIS — M542 Cervicalgia: Secondary | ICD-10-CM

## 2014-10-31 DIAGNOSIS — M25511 Pain in right shoulder: Secondary | ICD-10-CM

## 2014-10-31 DIAGNOSIS — G8929 Other chronic pain: Secondary | ICD-10-CM | POA: Diagnosis not present

## 2014-10-31 DIAGNOSIS — M546 Pain in thoracic spine: Secondary | ICD-10-CM | POA: Diagnosis not present

## 2014-10-31 DIAGNOSIS — M549 Dorsalgia, unspecified: Principal | ICD-10-CM

## 2014-10-31 MED ORDER — GABAPENTIN 300 MG PO CAPS: 300.0000 mg | ORAL_CAPSULE | Freq: Every day | ORAL | Status: DC

## 2014-10-31 MED ORDER — MELOXICAM 15 MG PO TABS: 15.0000 mg | ORAL_TABLET | Freq: Every day | ORAL | Status: DC

## 2014-10-31 MED ORDER — POLYETHYLENE GLYCOL 3350 17 GM/SCOOP PO POWD: ORAL | Status: DC

## 2014-10-31 MED ORDER — TIZANIDINE HCL 4 MG PO TABS: ORAL_TABLET | ORAL | Status: DC

## 2014-10-31 NOTE — Progress Notes (Signed)
Subjective: Here with husband Manufacturing systems engineerChattra Goodwin who translates.    She reports ongoing right shoulder, right arm, neck and back pain.  Has been having problems with this the last 3 years.  After last visit she was referred to orthopedics, but presumably due to language barriers, she is not aware of a referral.  She is out of the medication we have prescribed prior and says it never worked well.  She notes daily pains in neck, shoulders, arms, back.    She reports intermittent numbness in the right arm throughout, tingling, pain that goes all way down to the hand. She reports neck pains chronic. Nonspecific prior injury or trauma.  No prior steroid procedures and neck but did have a steroid shot in her shoulder here about a year ago with Dr.Lalonde that gave some shoulder relief.  She has used various NSAIDs, Ultram, using nothing currently. No other aggravating or relieving factors. No other complaint.  Review of systems as in subjective  Objective: Gen: wd, wn, nad Skin: unremarkable, no erythema, warmth, ecchymosis MSK: Tender over right AC joint, tender over right biceps origin, mild tenderness over the right arm in general, no swelling, no obvious deformity, pain with passive and active range of motion over 90, pain with apprehension test, pain with with any range of motion over 90, no obvious laxity, rest of arm exam unremarkable Neck: Right lateral neck tender, mild pain with range of motion of neck which is mildly reduced in general ROM and no mass no thyromegaly no lymphadenopathy Arms neurovascularly intact Back: Tender right upper back paraspinal, positive spasm, otherwise back nontender    Assessment: Encounter Diagnoses  Name Primary?  . Chronic upper back pain Yes  . Chronic right shoulder pain   . Chronic neck pain     Plan: Reviewed prior records where she has been seen for the same issue, reviewed xrays from last visit.  The interesting issue today is the fact that her and  husband both have similar chronic pains and both sleep on floor mats or the cough.  They share a 2 bedroom apartment with their son and others.  The son has the bed, but Becky Goodwin sleeps on a thin mat on the hard floor and Becky sleeps on the cough primarily.   She is doing some daily stretching.     Patient Instructions   Encounter Diagnoses  Name Primary?  . Chronic upper back pain Yes  . Chronic right shoulder pain   . Chronic neck pain    Recommendations:  I recommend buying an air mattress or foam mattress topper to help cushion her sleep mat  Sleeping on a thin floor mat is probably aggravating her pains  Begin Meloxicam once daily in the morning.  This is a pain and arthritis medication that could help  Begin Gabapentin 300mg  once daily at bedtime.  This is a pain medication.  Caution - this can cause sedation.  Begin Tizanidine as needed either at bedtime or up to twice daily if at home.  Caution - this can cause sedation.  Do a daily stretching routine and exercises to help loosen the neck and back and extremities  Begin Miralax 1/2-1 cap full daily in a beverage in the morning to help with constipation  Drink at least 64 oz of water daily  If back, neck and shoulder pains don't improve in the next month, consider physical therapy referral  Return in May for annual physical

## 2014-10-31 NOTE — Patient Instructions (Signed)
Encounter Diagnoses  Name Primary?  . Chronic upper back pain Yes  . Chronic right shoulder pain   . Chronic neck pain    Recommendations:  I recommend buying an air mattress or foam mattress topper to help cushion her sleep mat  Sleeping on a thin floor mat is probably aggravating her pains  Begin Meloxicam once daily in the morning.  This is a pain and arthritis medication that could help  Begin Gabapentin 300mg  once daily at bedtime.  This is a pain medication.  Caution - this can cause sedation.  Begin Tizanidine as needed either at bedtime or up to twice daily if at home.  Caution - this can cause sedation.  Do a daily stretching routine and exercises to help loosen the neck and back and extremities  Begin Miralax 1/2-1 cap full daily in a beverage in the morning to help with constipation  Drink at least 64 oz of water daily  If back, neck and shoulder pains don't improve in the next month, consider physical therapy referral  Return in May for annual physical

## 2015-01-04 ENCOUNTER — Other Ambulatory Visit: Payer: Self-pay | Admitting: Family Medicine

## 2015-02-27 ENCOUNTER — Encounter: Payer: Self-pay | Admitting: Medical

## 2015-02-27 ENCOUNTER — Ambulatory Visit (INDEPENDENT_AMBULATORY_CARE_PROVIDER_SITE_OTHER): Payer: 59 | Admitting: Medical

## 2015-02-27 VITALS — BP 98/60 | HR 80 | Resp 15 | Wt 157.0 lb

## 2015-02-27 DIAGNOSIS — M542 Cervicalgia: Secondary | ICD-10-CM

## 2015-02-27 DIAGNOSIS — M25512 Pain in left shoulder: Secondary | ICD-10-CM

## 2015-02-27 DIAGNOSIS — M25511 Pain in right shoulder: Secondary | ICD-10-CM

## 2015-02-27 DIAGNOSIS — R4789 Other speech disturbances: Secondary | ICD-10-CM

## 2015-02-27 DIAGNOSIS — G8929 Other chronic pain: Secondary | ICD-10-CM

## 2015-02-27 DIAGNOSIS — Z789 Other specified health status: Secondary | ICD-10-CM

## 2015-02-27 MED ORDER — HYDROCODONE-ACETAMINOPHEN 7.5-325 MG PO TABS: 1.0000 | ORAL_TABLET | Freq: Four times a day (QID) | ORAL | Status: DC | PRN

## 2015-02-27 NOTE — Progress Notes (Signed)
Subjective: Here with husband Manufacturing systems engineerChattra Steury who translates.    She reports ongoing right shoulder, right arm, neck and back pain.  Has been having problems with this the last 3 years.  After last visit she was referred to orthopedics, but presumably due to language barriers, she is not aware of a referral.  She is out of the medication we have prescribed prior and says it never worked well.  She notes daily pains in neck, shoulders, arms, back.    She reports intermittent numbness in the right arm throughout, tingling, pain that goes all way down to the hand. She reports neck pains chronic. Nonspecific prior injury or trauma.  No prior steroid procedures and neck but did have a steroid shot in her shoulder here about a year ago 12/2013 with Dr.Lalonde that gave some shoulder relief.  She has used various NSAIDs, Ultram, using nothing currently. No other aggravating or relieving factors. No other complaint.  Review of systems as in subjective  Objective: Gen: wd, wn, nad Skin: unremarkable, no erythema, warmth, ecchymosis MSK: Tender over right AC joint, tender over right biceps origin, mild tenderness over the right arm in general, no swelling, no obvious deformity, pain with passive and active range of motion over 90, pain with apprehension test, pain with with any range of motion over 90, no obvious laxity, rest of arm exam unremarkable Neck: Right lateral neck tender, mild pain with range of motion of neck which is mildly reduced in general ROM and no mass no thyromegaly no lymphadenopathy Arms neurovascularly intact Back: Tender right upper back paraspinal, positive spasm, otherwise back nontender   DG C spine xray and right shoulder 07/21/2014 IMPRESSION: Normal alignment of the cervical vertebral bodies and no acute bony abnormality or degenerative change.  Normal right shoulder examination.    Assessment: Encounter Diagnoses  Name Primary?  . Bilateral shoulder pain Yes  .  Chronic right shoulder pain   . Neck pain   . Non-English speaking patient     Plan: Reviewed prior records where she has been seen for the same issue, reviewed xrays from last visit including C spine xray.    Hydrocodone for prn use, refer to ortho, appt made and given to patient while they were here in the office.   Of note, one interesting issue today is the fact that her and husband both have similar chronic pains and both sleep on floor mats or the couch.  They share a 2 bedroom apartment with their son and others.  The son has the bed, but Clytie sleeps on a thin mat on the hard floor and Chattra sleeps on the cough primarily.   She is doing some daily stretching.

## 2015-03-01 ENCOUNTER — Other Ambulatory Visit: Payer: Self-pay | Admitting: Medical

## 2015-03-01 ENCOUNTER — Telehealth: Payer: Self-pay | Admitting: Medical

## 2015-03-01 MED ORDER — TRAMADOL HCL 50 MG PO TABS: 50.0000 mg | ORAL_TABLET | Freq: Four times a day (QID) | ORAL | Status: DC | PRN

## 2015-03-01 NOTE — Telephone Encounter (Signed)
, °

## 2015-03-01 NOTE — Telephone Encounter (Signed)
hydrocodone made her nausea and vomiting. Wants something different to use for pain

## 2015-03-21 ENCOUNTER — Other Ambulatory Visit: Payer: Self-pay

## 2015-03-21 DIAGNOSIS — Z1231 Encounter for screening mammogram for malignant neoplasm of breast: Secondary | ICD-10-CM

## 2015-03-28 ENCOUNTER — Ambulatory Visit: Payer: Self-pay

## 2015-04-04 ENCOUNTER — Ambulatory Visit
Admission: RE | Admit: 2015-04-04 | Discharge: 2015-04-04 | Disposition: A | Discharge status: Home / Self Care | Payer: 59 | Source: Ambulatory Visit

## 2015-04-04 DIAGNOSIS — Z1231 Encounter for screening mammogram for malignant neoplasm of breast: Secondary | ICD-10-CM

## 2015-04-06 ENCOUNTER — Encounter: Payer: Self-pay | Admitting: *Deleted

## 2015-04-19 ENCOUNTER — Other Ambulatory Visit: Payer: Self-pay | Admitting: Family Medicine

## 2015-07-29 ENCOUNTER — Emergency Department (HOSPITAL_COMMUNITY)
Admission: EM | Admit: 2015-07-29 | Discharge: 2015-07-29 | Disposition: A | Discharge status: Home / Self Care | Payer: 59 | Attending: Emergency Medicine | Admitting: Emergency Medicine

## 2015-07-29 ENCOUNTER — Emergency Department (HOSPITAL_COMMUNITY): Payer: 59

## 2015-07-29 ENCOUNTER — Encounter (HOSPITAL_COMMUNITY): Payer: Self-pay | Admitting: Emergency Medicine

## 2015-07-29 DIAGNOSIS — S199XXA Unspecified injury of neck, initial encounter: Secondary | ICD-10-CM | POA: Insufficient documentation

## 2015-07-29 DIAGNOSIS — Z9289 Personal history of other medical treatment: Secondary | ICD-10-CM | POA: Insufficient documentation

## 2015-07-29 DIAGNOSIS — S0990XA Unspecified injury of head, initial encounter: Secondary | ICD-10-CM | POA: Insufficient documentation

## 2015-07-29 DIAGNOSIS — Z8739 Personal history of other diseases of the musculoskeletal system and connective tissue: Secondary | ICD-10-CM | POA: Diagnosis not present

## 2015-07-29 DIAGNOSIS — I10 Essential (primary) hypertension: Secondary | ICD-10-CM | POA: Diagnosis not present

## 2015-07-29 DIAGNOSIS — R51 Headache: Secondary | ICD-10-CM | POA: Diagnosis not present

## 2015-07-29 DIAGNOSIS — Y9289 Other specified places as the place of occurrence of the external cause: Secondary | ICD-10-CM | POA: Diagnosis not present

## 2015-07-29 DIAGNOSIS — S46911A Strain of unspecified muscle, fascia and tendon at shoulder and upper arm level, right arm, initial encounter: Secondary | ICD-10-CM | POA: Diagnosis not present

## 2015-07-29 DIAGNOSIS — Y998 Other external cause status: Secondary | ICD-10-CM | POA: Insufficient documentation

## 2015-07-29 DIAGNOSIS — W010XXA Fall on same level from slipping, tripping and stumbling without subsequent striking against object, initial encounter: Secondary | ICD-10-CM | POA: Insufficient documentation

## 2015-07-29 DIAGNOSIS — Z79899 Other long term (current) drug therapy: Secondary | ICD-10-CM | POA: Diagnosis not present

## 2015-07-29 DIAGNOSIS — H53143 Visual discomfort, bilateral: Secondary | ICD-10-CM | POA: Insufficient documentation

## 2015-07-29 DIAGNOSIS — Y9389 Activity, other specified: Secondary | ICD-10-CM | POA: Diagnosis not present

## 2015-07-29 DIAGNOSIS — G8929 Other chronic pain: Secondary | ICD-10-CM | POA: Diagnosis not present

## 2015-07-29 DIAGNOSIS — S4991XA Unspecified injury of right shoulder and upper arm, initial encounter: Secondary | ICD-10-CM | POA: Diagnosis present

## 2015-07-29 MED ORDER — ACETAMINOPHEN 500 MG PO TABS
1000.0000 mg | ORAL_TABLET | Freq: Once | ORAL | Status: AC
  Administered 2015-07-29: 1000 mg via ORAL
  Filled 2015-07-29: qty 2

## 2015-07-29 NOTE — Discharge Instructions (Signed)
Follow up with orthopaedic doctor. Return for persistent vomiting, severe headache, weakness or other concerns.  If you were given medicines take as directed.  If you are on coumadin or contraceptives realize their levels and effectiveness is altered by many different medicines.  If you have any reaction (rash, tongues swelling, other) to the medicines stop taking and see a physician.    If your blood pressure was elevated in the ER make sure you follow up for management with a primary doctor or return for chest pain, shortness of breath or stroke symptoms.  Please follow up as directed and return to the ER or see a physician for new or worsening symptoms.  Thank you. Filed Vitals:   07/29/15 1339  BP: 125/94  Pulse: 78  Temp: 97.8 F (36.6 C)  TempSrc: Oral  Resp: 18  SpO2: 98%

## 2015-07-29 NOTE — ED Provider Notes (Signed)
CSN: [id number]     Arrival date & time 07/29/15  1324 History   First MD Initiated Contact with Patient 07/29/15 1559     Chief Complaint  Patient presents with  . Headache  . Arm Pain  . Fall     (Consider location/radiation/quality/duration/timing/severity/associated sxs/prior Treatment) HPI Comments: 49 yo female, daughter comfortable translating presents with right shoulder pain worse with movement.  Pt slipped on the floor landing directly on floor, no significant head injury. No loc.  No weakness. Mild numbness right neck and shoulder.No other injuries.  Patient is a 49 y.o. female presenting with headaches, arm pain, and fall. The history is provided by the patient and a relative.  Headache Associated symptoms: no abdominal pain, no back pain, no congestion, no fever, no neck pain, no neck stiffness and no vomiting   Arm Pain Associated symptoms include headaches. Pertinent negatives include no chest pain, no abdominal pain and no shortness of breath.  Fall Associated symptoms include headaches. Pertinent negatives include no chest pain, no abdominal pain and no shortness of breath.    Past Medical History  Diagnosis Date  . Hypertension   . Chronic neck pain   . Shoulder pain   . H/O mammogram 02/2013    normal   Past Surgical History  Procedure Laterality Date  . Tonsillectomy      Right tonsillectomy, 2wk hospitalization ([facility])  . Cyst excision      upper chest/ breast ([facility]), 1 week hospitalization  . Jejunostomy feeding tube      prior with surgery   Family History  Problem Relation Age of Onset  . Hypertension Father   . Cancer Neg Hx   . Diabetes Neg Hx   . Heart disease Neg Hx   . Stroke Neg Hx    Social History  Substance Use Topics  . Smoking status: Never Smoker   . Smokeless tobacco: None  . Alcohol Use: No   OB History    No data available     Review of Systems  Constitutional: Negative for fever and chills.  HENT: Negative for  congestion.   Eyes: Positive for visual disturbance (mild blurry bilateral).  Respiratory: Negative for shortness of breath.   Cardiovascular: Negative for chest pain.  Gastrointestinal: Negative for vomiting and abdominal pain.  Genitourinary: Negative for dysuria and flank pain.  Musculoskeletal: Negative for back pain, neck pain and neck stiffness.  Skin: Negative for rash.  Neurological: Positive for headaches. Negative for light-headedness.      Allergies  Hydrocodone  Home Medications   Prior to Admission medications   Medication Sig Start Date End Date Taking? Authorizing Provider  lisinopril-hydrochlorothiazide (PRINZIDE,ZESTORETIC) 10-12.5 MG per tablet TAKE ONE TABLET BY MOUTH ONCE DAILY 04/19/15  Yes Kermit Balo Tysinger, PA-C  polyethylene glycol powder (GLYCOLAX/MIRALAX) powder 1 cap full daily Patient not taking: Reported on 02/27/2015 10/31/14   Kermit Balo Tysinger, PA-C  traMADol (ULTRAM) 50 MG tablet Take 1 tablet (50 mg total) by mouth every 6 (six) hours as needed. Patient not taking: Reported on 07/29/2015 03/01/15   Kermit Balo Tysinger, PA-C   BP 125/94 mmHg  Pulse 78  Temp(Src) 97.8 F (36.6 C) (Oral)  Resp 18  SpO2 98%  LMP 10/06/2012 Physical Exam  Constitutional: She is oriented to person, place, and time. She appears well-developed and well-nourished.  HENT:  Head: Normocephalic and atraumatic.  Eyes: Conjunctivae are normal. Right eye exhibits no discharge. Left eye exhibits no discharge.  Neck: Normal range of  motion. Neck supple. No tracheal deviation present.  Cardiovascular: Normal rate.   Pulmonary/Chest: Effort normal.  Musculoskeletal: She exhibits tenderness. She exhibits no edema.  Tender right anterior and lateral shoulder, moderate pain with ext rotation and flexion.  NV intact distal arm.  Mild decr sensation right upper arm vs left.  Equal pulses  Neurological: She is alert and oriented to person, place, and time.  5+ strength in UE and LE with  f/e at major joints. Sensation to palpation intact in UE and LE. CNs 2-12 grossly intact.  EOMFI.  PERRL.   Finger nose and coordination intact bilateral.   Visual fields intact to finger testing. No nystagmus   Skin: Skin is warm. No rash noted.  Psychiatric: She has a normal mood and affect.  Nursing note and vitals reviewed.   ED Course  Procedures (including critical care time) Labs Review Labs Reviewed - No data to display  Imaging Review Dg Shoulder Right  07/29/2015  CLINICAL DATA:  49 year old female with right shoulder pain following fall yesterday. Initial encounter. EXAM: RIGHT SHOULDER - 2+ VIEW COMPARISON:  07/21/2014 FINDINGS: There is no evidence of fracture or dislocation. There is no evidence of arthropathy or other focal bone abnormality. Soft tissues are unremarkable. IMPRESSION: Negative. Electronically Signed   By: Harmon PierJeffrey  Hu M.D.   On: 07/29/2015 14:36   I have personally reviewed and evaluated these images and lab results as part of my medical decision-making.   EKG Interpretation None      MDM   Final diagnoses:  Right shoulder strain, initial encounter   Shoulder strain, possible mild concussion. Well appearing, normal neuro exam. Xray no fx. Fup with ortho, sling and likely PT.  Results and differential diagnosis were discussed with the patient/parent/guardian. Xrays were independently reviewed by myself.  Close follow up outpatient was discussed, comfortable with the plan.   Medications  acetaminophen (TYLENOL) tablet 1,000 mg (not administered)    Filed Vitals:   07/29/15 1339  BP: 125/94  Pulse: 78  Temp: 97.8 F (36.6 C)  TempSrc: Oral  Resp: 18  SpO2: 98%    Final diagnoses:  Right shoulder strain, initial encounter        Blane OharaJoshua Lena Fieldhouse, MD 07/29/15 [phone]

## 2015-07-29 NOTE — ED Notes (Signed)
Pt c/o slip and fall yesterday c/o right arm pain and HA; pt denies hitting head

## 2015-07-31 ENCOUNTER — Other Ambulatory Visit: Payer: Self-pay | Admitting: Medical

## 2015-08-02 ENCOUNTER — Ambulatory Visit (INDEPENDENT_AMBULATORY_CARE_PROVIDER_SITE_OTHER): Payer: 59 | Admitting: Medical

## 2015-08-02 ENCOUNTER — Encounter: Payer: Self-pay | Admitting: Medical

## 2015-08-02 VITALS — BP 118/84 | HR 93 | Wt 163.0 lb

## 2015-08-02 DIAGNOSIS — I10 Essential (primary) hypertension: Secondary | ICD-10-CM | POA: Diagnosis not present

## 2015-08-02 DIAGNOSIS — M25511 Pain in right shoulder: Secondary | ICD-10-CM | POA: Diagnosis not present

## 2015-08-02 MED ORDER — NAPROXEN 500 MG PO TABS: 500.0000 mg | ORAL_TABLET | Freq: Two times a day (BID) | ORAL | Status: DC

## 2015-08-02 MED ORDER — LISINOPRIL-HYDROCHLOROTHIAZIDE 10-12.5 MG PO TABS: 1.0000 | ORAL_TABLET | Freq: Every day | ORAL | Status: DC

## 2015-08-02 MED ORDER — TRAMADOL HCL 50 MG PO TABS: 50.0000 mg | ORAL_TABLET | Freq: Four times a day (QID) | ORAL | Status: DC | PRN

## 2015-08-02 NOTE — Progress Notes (Addendum)
Subjective: Chief Complaint  Patient presents with  . hospital follow up    rt shoulder pain. said it is not much better   Here with husband and interpreter from [employer].  Went to the ED 07/29/15 for shoulder pain after she slipped in the shower falling on her right shoulder.   Was given 2 pain pills in the ED.  Since she was releasee hasn't been using any medication or ice as she was just told to f/u with orthopedist.   Today she has ongoing right shoulder pain, some ROM limitations.  No arm paresthesias.  She does endorse some right lateral neck pain, decreased ROM of right arm particular with OTC motion    Hypertension - She is compliant with BP medication but ran out of BP medication today.  Denies chest pain, SOB, edema, palpitations.  No other aggravating or relieving factors. No other complaint.  Objective: BP 118/84 mmHg  Pulse 93  Wt 163 lb (73.936 kg)  LMP 10/06/2012  Gen: wd, wn, nad Tender over right lateral neck, and mild pain with neck ROM which seems full No neck mass, thyromegaly or lymphadenopathy Tender over right AC joint, pain with passive, active and resisted ROM above 80 degrees flexion and abduction, no laxity, no crack/pop sounds, no swelling.  Rest of arm nontender, and otherwise normal ROM Arms neurovascularly intact No chest wall tenders Mild right upper back paraspinal muscle tenderness, nontender otherwise     Assessment: Encounter Diagnoses  Name Primary?  . Right shoulder pain Yes  . Essential hypertension      Plan: Right shoulder pain - reviewed 07/29/15 ED visit and xray.   She has had chronic shoulder problems for at least 3+ years.  Has had steroid injection prior here, 12/2013.  Has failed NSAIDs, and due to language barriers and work scheduled hasn't been able to pursue physical therapy prior.  Referral to ortho for further eval and management.  In the meantime, advised ice the next 3-4 days, arm sling periodically for rest,  Naprosyn BID, and ultram for worse pain.  F/u with ortho  HTN - refilled medication, controlled, return soon for a physical and fasting labs.   Spent > 30 minutes face to face with patient in discussion of symptoms, evaluation, plan and recommendations, particularly in light of language barrier..Marland Kitchen

## 2015-09-27 ENCOUNTER — Emergency Department (INDEPENDENT_AMBULATORY_CARE_PROVIDER_SITE_OTHER)
Admission: EM | Admit: 2015-09-27 | Discharge: 2015-09-27 | Disposition: A | Discharge status: Home / Self Care | Payer: BLUE CROSS/BLUE SHIELD | Source: Home / Self Care | Attending: Family Medicine | Admitting: Family Medicine

## 2015-09-27 ENCOUNTER — Encounter (HOSPITAL_COMMUNITY): Payer: Self-pay | Admitting: Emergency Medicine

## 2015-09-27 DIAGNOSIS — J111 Influenza due to unidentified influenza virus with other respiratory manifestations: Secondary | ICD-10-CM | POA: Diagnosis not present

## 2015-09-27 MED ORDER — OSELTAMIVIR PHOSPHATE 75 MG PO CAPS: 75.0000 mg | ORAL_CAPSULE | Freq: Two times a day (BID) | ORAL | Status: DC

## 2015-09-27 NOTE — Discharge Instructions (Signed)

## 2015-09-27 NOTE — ED Provider Notes (Signed)
CSN: [id number]     Arrival date & time 09/27/15  1846 History   First MD Initiated Contact with Patient 09/27/15 1953     Chief Complaint  Patient presents with  . Cough  . Sore Throat  . Fever   (Consider location/radiation/quality/duration/timing/severity/associated sxs/prior Treatment) HPI Fever, body aches, sore throat, cough started today no home treatment. No pain. No previous symptoms no flu shot. Past Medical History  Diagnosis Date  . Hypertension   . Chronic neck pain   . Shoulder pain   . H/O mammogram 02/2013    normal   Past Surgical History  Procedure Laterality Date  . Tonsillectomy      Right tonsillectomy, 2wk hospitalization ([facility])  . Cyst excision      upper chest/ breast ([facility]), 1 week hospitalization  . Jejunostomy feeding tube      prior with surgery   Family History  Problem Relation Age of Onset  . Hypertension Father   . Cancer Neg Hx   . Diabetes Neg Hx   . Heart disease Neg Hx   . Stroke Neg Hx    Social History  Substance Use Topics  . Smoking status: Never Smoker   . Smokeless tobacco: None  . Alcohol Use: No   OB History    No data available     Review of Systems See history of present illness Allergies  Hydrocodone  Home Medications   Prior to Admission medications   Medication Sig Start Date End Date Taking? Authorizing Provider  lisinopril-hydrochlorothiazide (PRINZIDE,ZESTORETIC) 10-12.5 MG tablet Take 1 tablet by mouth daily. 08/02/15  Yes Kermit Balo Tysinger, PA-C  naproxen (NAPROSYN) 500 MG tablet Take 1 tablet (500 mg total) by mouth 2 (two) times daily with a meal. 08/02/15   Jac Canavan, PA-C  oseltamivir (TAMIFLU) 75 MG capsule Take 1 capsule (75 mg total) by mouth every 12 (twelve) hours. 09/27/15   Tharon Aquas, PA  oseltamivir (TAMIFLU) 75 MG capsule Take 1 capsule (75 mg total) by mouth every 12 (twelve) hours. 09/27/15   Tharon Aquas, PA  polyethylene glycol powder [facility]) powder 1 cap full  daily Patient not taking: Reported on 02/27/2015 10/31/14   Kermit Balo Tysinger, PA-C  traMADol (ULTRAM) 50 MG tablet Take 1 tablet (50 mg total) by mouth every 6 (six) hours as needed. 08/02/15   Jac Canavan, PA-C   Meds Ordered and Administered this Visit  Medications - No data to display  BP 117/85 mmHg  Pulse 117  Temp(Src) 100.3 F (37.9 C) (Oral)  Resp 12  SpO2 99%  LMP 10/06/2012 No data found.   Physical Exam NURSES NOTES AND VITAL SIGNS REVIEWED. CONSTITUTIONAL: Well developed, well nourished, no acute distress HEENT: normocephalic, atraumatic, right and left TM's are normal EYES: Conjunctiva normal NECK:normal ROM, supple, no adenopathy PULMONARY:No respiratory distress, normal effort, Lungs: CTAb/l, no wheezes, or increased work of breathing CARDIOVASCULAR: RRR, no murmur ABDOMEN: soft, ND, NT, +'ve BS MUSCULOSKELETAL: Normal ROM of all extremities,  SKIN: warm and dry without rash PSYCHIATRIC: Mood and affect, behavior are normal  ED Course  Procedures (including critical care time)  Labs Review Labs Reviewed - No data to display  Imaging Review No results found.   Visual Acuity Review  Right Eye Distance:   Left Eye Distance:   Bilateral Distance:    Right Eye Near:   Left Eye Near:    Bilateral Near:       Patient looks uncomfortable but she is  not in any acute distress. She is afebrile. Symptomatic treatment is suggested. Return to work note is provided.  MDM   1. Flu    Patient is advised to continue home symptomatic treatment. Prescription for tamiflu  sent pharmacy patient has indicated. Patient is advised that if there are new or worsening symptoms or attend the emergency department, or contact primary care provider. Instructions of care provided discharged home in stable condition. Return to work/school note provided.  THIS NOTE WAS GENERATED USING A VOICE RECOGNITION SOFTWARE PROGRAM. ALL REASONABLE EFFORTS  WERE MADE TO PROOFREAD  THIS DOCUMENT FOR ACCURACY.     Tharon Aquas, PA 09/27/15 2020

## 2015-09-27 NOTE — ED Notes (Signed)
Pt woke up this morning with a cough, sore throat and fever.  She is here with her daughter who is translating for her.

## 2015-09-29 ENCOUNTER — Telehealth: Payer: Self-pay

## 2015-09-29 ENCOUNTER — Ambulatory Visit: Payer: BLUE CROSS/BLUE SHIELD | Admitting: Medical

## 2015-09-29 NOTE — Telephone Encounter (Signed)
This patient no showed for their appointment today.Which of the following is necessary for this patient.   A) No follow-up necessary   B) Follow-up urgent. Locate Patient Immediately.   C) Follow-up necessary. Contact patient and Schedule visit in ____ Days.   D) Follow-up Advised. Contact patient and Schedule visit in ____ Days. 

## 2015-09-29 NOTE — Telephone Encounter (Signed)
D

## 2015-10-24 ENCOUNTER — Telehealth: Payer: Self-pay | Admitting: Medical

## 2015-10-24 NOTE — Telephone Encounter (Signed)
Pt's [facility] came by yesterday and [facility]. Pt was informed [facility] had already left for the day. Pt was told I would forward to Tammy today. Tammy is not in today due to a sick child. Left message informing pt's spouse that Tammy not in and would be taken care of upon her return.

## 2015-10-27 ENCOUNTER — Other Ambulatory Visit: Payer: Self-pay | Admitting: Medical

## 2015-10-27 ENCOUNTER — Ambulatory Visit (INDEPENDENT_AMBULATORY_CARE_PROVIDER_SITE_OTHER): Payer: BLUE CROSS/BLUE SHIELD | Admitting: Medical

## 2015-10-27 ENCOUNTER — Encounter: Payer: Self-pay | Admitting: Medical

## 2015-10-27 ENCOUNTER — Telehealth: Payer: Self-pay

## 2015-10-27 VITALS — BP 100/78 | HR 77 | Ht 65.0 in | Wt 162.0 lb

## 2015-10-27 DIAGNOSIS — G8929 Other chronic pain: Secondary | ICD-10-CM | POA: Diagnosis not present

## 2015-10-27 DIAGNOSIS — R109 Unspecified abdominal pain: Secondary | ICD-10-CM

## 2015-10-27 DIAGNOSIS — Z0000 Encounter for general adult medical examination without abnormal findings: Secondary | ICD-10-CM

## 2015-10-27 DIAGNOSIS — Z1211 Encounter for screening for malignant neoplasm of colon: Secondary | ICD-10-CM | POA: Insufficient documentation

## 2015-10-27 DIAGNOSIS — M25511 Pain in right shoulder: Secondary | ICD-10-CM

## 2015-10-27 DIAGNOSIS — I10 Essential (primary) hypertension: Secondary | ICD-10-CM | POA: Diagnosis not present

## 2015-10-27 DIAGNOSIS — R102 Pelvic and perineal pain: Secondary | ICD-10-CM

## 2015-10-27 DIAGNOSIS — Z1239 Encounter for other screening for malignant neoplasm of breast: Secondary | ICD-10-CM | POA: Diagnosis not present

## 2015-10-27 LAB — BASIC METABOLIC PANEL
BUN: 10 mg/dL (ref 7–25)
CALCIUM: 9.5 mg/dL (ref 8.6–10.4)
CO2: 28 mmol/L (ref 20–31)
Chloride: 102 mmol/L (ref 98–110)
Creat: 0.58 mg/dL (ref 0.50–1.05)
Glucose, Bld: 99 mg/dL (ref 65–99)
Potassium: 3.9 mmol/L (ref 3.5–5.3)
SODIUM: 140 mmol/L (ref 135–146)

## 2015-10-27 LAB — LIPID PANEL
CHOLESTEROL: 191 mg/dL (ref 125–200)
HDL: 34 mg/dL — AB (ref >=46)
LDL Cholesterol: 112 mg/dL (ref <130)
TRIGLYCERIDES: 225 mg/dL — AB (ref <150)
Total CHOL/HDL Ratio: 5.6 Ratio — ABNORMAL HIGH (ref <=5.0)
VLDL: 45 mg/dL — ABNORMAL HIGH (ref <30)

## 2015-10-27 LAB — HEMOGLOBIN: HEMOGLOBIN: 12.8 g/dL (ref 12.0–15.0)

## 2015-10-27 LAB — TSH: TSH: 0.2 mIU/L — ABNORMAL LOW

## 2015-10-27 MED ORDER — LISINOPRIL-HYDROCHLOROTHIAZIDE 10-12.5 MG PO TABS: 1.0000 | ORAL_TABLET | Freq: Every day | ORAL | Status: DC

## 2015-10-27 MED ORDER — ESOMEPRAZOLE MAGNESIUM 20 MG PO PACK: 20.0000 mg | PACK | Freq: Every day | ORAL | Status: DC

## 2015-10-27 MED ORDER — TRAMADOL HCL 50 MG PO TABS: 50.0000 mg | ORAL_TABLET | Freq: Four times a day (QID) | ORAL | Status: DC | PRN

## 2015-10-27 NOTE — Progress Notes (Signed)
Subjective:   HPI  Becky Goodwin is a 50 y.o. female who presents for a complete physical.  Here with her husband who translates today  Concerns: ongoing several months of pain in lower abdomen.  Denies constipation, vaginal bleeding, urinary concerns.   Pain is daily, but lower abdomen.  She defers pelvic exam today, requests female provider.   Has chronic right shoulder pain, still having this pain currently   Reviewed their medical, surgical, family, social, medication, and allergy history and updated chart as appropriate.  Past Medical History  Diagnosis Date  . Hypertension   . Chronic neck pain   . Shoulder pain     Past Surgical History  Procedure Laterality Date  . Tonsillectomy      Right tonsillectomy, 2wk hospitalization ([facility])  . Cyst excision      upper chest/ breast ([facility]), 1 week hospitalization  . Jejunostomy feeding tube      prior with surgery    Social History   Social History  . Marital Status: Married    Spouse Name: N/A  . Number of Children: N/A  . Years of Education: N/A   Occupational History  . Not on file.   Social History Main Topics  . Smoking status: Never Smoker   . Smokeless tobacco: Not on file  . Alcohol Use: No  . Drug Use: No  . Sexual Activity: Yes    Birth Control/ Protection: None   Other Topics Concern  . Not on file   Social History Narrative   Lives with husband, 3 children, ages 50yo, 50yo, 50yo, works in Designer, [employer], various jobs, some exercise with walking    Family History  Problem Relation Age of Onset  . Hypertension Father   . Cancer Neg Hx   . Diabetes Neg Hx   . Heart disease Neg Hx   . Stroke Neg Hx      Current outpatient prescriptions:  .  lisinopril-hydrochlorothiazide (PRINZIDE,ZESTORETIC) 10-12.5 MG tablet, Take 1 tablet by mouth daily., Disp: 90 tablet, Rfl: 3 .  esomeprazole (NEXIUM) 20 MG packet, Take 20 mg by mouth daily before breakfast., Disp: 30 each, Rfl: 0 .  polyethylene glycol powder  (GLYCOLAX/MIRALAX) powder, 1 cap full daily (Patient not taking: Reported on 02/27/2015), Disp: 3350 g, Rfl: 0 .  traMADol (ULTRAM) 50 MG tablet, Take 1 tablet (50 mg total) by mouth every 6 (six) hours as needed., Disp: 30 tablet, Rfl: 0  Allergies  Allergen Reactions  . Hydrocodone Nausea And Vomiting     Review of Systems Constitutional: -fever, -chills, -sweats, -unexpected weight change, -decreased appetite, -fatigue Allergy: -sneezing, -itching, -congestion Dermatology: -changing moles, --rash, -lumps ENT: -runny nose, -ear pain, -sore throat, -hoarseness, -sinus pain, -teeth pain, - ringing in ears, -hearing loss, -nosebleeds Cardiology: -chest pain, -palpitations, -swelling, -difficulty breathing when lying flat, -waking up short of breath Respiratory: -cough, -shortness of breath, -difficulty breathing with exercise or exertion, -wheezing, -coughing up blood Gastroenterology: +abdominal pain, -nausea, -vomiting, -diarrhea, -constipation, -blood in stool, -changes in bowel movement, -difficulty swallowing or eating Hematology: -bleeding, -bruising  Musculoskeletal: +joint aches, -muscle aches, -joint swelling, -back pain, -neck pain, -cramping, -changes in gait Ophthalmology: denies vision changes, eye redness, itching, discharge Urology: -burning with urination, -difficulty urinating, -blood in urine, -urinary frequency, -urgency, -incontinence Neurology: -headache, -weakness, -tingling, -numbness, -memory loss, -falls, -dizziness Psychology: -depressed mood, -agitation, -sleep problems     Objective:   Physical Exam  BP 100/78 mmHg  Pulse 77  Ht 5\' 5"  (1.651 m)  Wt  162 lb (73.483 kg)  BMI 26.96 kg/m2  LMP 10/06/2012  General appearance: alert, no distress, WD/WN, [place] female Skin: left cheek with round brown 2mm papule , unchanged per patient, scattered benign appearing macules, no worrisome lesions HEENT: normocephalic, conjunctiva/corneas normal, sclerae anicteric,  PERRLA, EOMi, nares patent, no discharge or erythema, pharynx normal Oral cavity: MMM, tongue normal, teeth in good repair, some stain Neck: supple, no lymphadenopathy, no thyromegaly, no masses, normal ROM, no bruits Chest: non tender, normal shape and expansion Heart: RRR, normal S1, S2, no murmurs Lungs: CTA bilaterally, no wheezes, rhonchi, or rales Abdomen: +bs, soft, generalized lower abdominal pain, otherwise, non tender, non distended, no masses, no hepatomegaly, no splenomegaly, no bruits Back: non tender, normal ROM, no scoliosis Musculoskeletal: upper extremities non tender, no obvious deformity, normal ROM throughout, lower extremities non tender, no obvious deformity, normal ROM throughout Extremities: no edema, no cyanosis, no clubbing Pulses: 2+ symmetric, upper and lower extremities, normal cap refill Neurological: alert, oriented x 3, CN2-12 intact, strength normal upper extremities and lower extremities, sensation normal throughout, DTRs 2+ throughout, no cerebellar signs, gait normal Psychiatric: normal affect, behavior normal, pleasant  Breast/gyn/rectal - deferred at patient's request    Assessment and Plan :    Encounter Diagnoses  Name Primary?  . Encounter for health maintenance examination in adult Yes  . Essential hypertension   . Chronic right shoulder pain   . Abdominal pain, unspecified abdominal location   . Pelvic pain in female   . Special screening for malignant neoplasms, colon   . Screening for breast cancer     Physical exam - discussed healthy lifestyle, diet, exercise, preventative care, vaccinations, and addressed their concerns.   See your eye doctor yearly for routine vision care. See your dentist yearly for routine dental care including hygiene visits twice yearly. Hypertension-continue same medication as usual Lower abdominal pain - will check costs of ultrasound cash pay since she doesn't have insurance currently.   Will call back about  this.  Return to see Chip Boer for pelvic exam or if desired can be referred to gyn chronic shoulder pain - reviewed 2016 xray.  Can use Aleve OTC prn, Utram for worse pain, and can go back to ortho for further management Advised mammogram q2 years, due for pap in 1 year or now pending exam findings Discussed first screening colonoscopy.  She defers for now given no insurance Follow-up pending labs

## 2015-10-27 NOTE — Patient Instructions (Signed)
Encounter Diagnoses  Name Primary?  . Encounter for health maintenance examination in adult Yes  . Chronic right shoulder pain   . Abdominal pain, unspecified abdominal location   . Pelvic pain in female   . Special screening for malignant neoplasms, colon   . Screening for breast cancer   . Essential hypertension    Recommendations:  See your eye doctor yearly for routine vision care.  See your dentist yearly for routine dental care including hygiene visits twice yearly.  When you get insurance, I recommend a screening colonoscopy for colon cancer screening.  This is done through a gastroenterologist/specialist  Given your pelvic pain, I recommend an ultrasound.  We will call to check pricing on this  You should have a mammogram every 2 years  We will check routine labs today  continue your blood pressure medication  You need a pelvic exam.  This can be done by our female nurse practitioner Chip BoerVicki here, or I can refer you to gynecology  exercise regularly  Eat a health diet  Regarding shoulder pain - you can use Aleve OTC or for worse pain, Ultram as needed

## 2015-10-27 NOTE — Telephone Encounter (Signed)
[facility] stated if she pays at the time of service it is around $401 ( pelvic u/s includes a transvaginal) if she does not pay at time of service is it around $700.

## 2015-10-27 NOTE — Telephone Encounter (Signed)
Please let them know.  This would be to evaluate pelvic pain/abdominal pain.  If they think they will get insurance soon, then we can hold off until then.   The other option would be to refer to gyn for pelvic exam and they can do ultrasound on site.   See what they want to do to further eval pelvic pain?

## 2015-10-28 LAB — HEMOGLOBIN A1C
HEMOGLOBIN A1C: 5.7 % — AB (ref <5.7)
MEAN PLASMA GLUCOSE: 117 mg/dL — AB (ref <117)

## 2015-10-30 LAB — T4, FREE: Free T4: 1.4 ng/dL (ref 0.8–1.8)

## 2015-10-30 NOTE — Telephone Encounter (Signed)
LMTCB

## 2015-11-01 NOTE — Addendum Note (Signed)
Addended by: Kieth BrightlyLAWSON, Inocente Krach M on: 11/01/2015 04:27 PM   Modules accepted: Orders

## 2015-11-01 NOTE — Telephone Encounter (Signed)
Referred to GYN and Ortho

## 2015-11-01 NOTE — Telephone Encounter (Signed)
LMTCB

## 2015-11-14 ENCOUNTER — Telehealth: Payer: Self-pay | Admitting: Obstetrics & Gynecology

## 2015-11-14 NOTE — Telephone Encounter (Signed)
Called and left a message for patient's husband to call back to schedule a new patient doctor referral.

## 2015-11-14 NOTE — Telephone Encounter (Signed)
Called and left a message for patient's husband to call back to schedule a new patient doctor referral. He said he was at work and to please call back between 11-11:45 AM later today. I will call back during that time with an interpretor.

## 2015-11-20 ENCOUNTER — Ambulatory Visit: Payer: BLUE CROSS/BLUE SHIELD

## 2015-11-20 ENCOUNTER — Telehealth: Payer: Self-pay | Admitting: Obstetrics and Gynecology

## 2015-11-20 NOTE — Telephone Encounter (Signed)
Patient's spouse came by the office today and cancelled her appointment for 11/22/15 stating the patient cannot come that day. I called her back to reschedule her but had to leave a message.Called and left a message for patient to call back to schedule a new patient doctor referral.

## 2015-11-21 NOTE — Telephone Encounter (Signed)
Patient rescheduled to 11/29/15 with Dr. Oscar LaJertson.

## 2015-11-22 ENCOUNTER — Ambulatory Visit: Payer: BLUE CROSS/BLUE SHIELD | Admitting: Obstetrics and Gynecology

## 2015-11-23 ENCOUNTER — Telehealth: Payer: Self-pay | Admitting: Medical

## 2015-11-23 NOTE — Telephone Encounter (Signed)
Becky Goodwin come by and states that the nexium was make [name] tired and sleepy and not able to walk, only one box was missing, informed Becky Goodwin you was out of the office till Monday, he returned the samples that was given to them, please advise pt, is prescribed tramadol and may have the two confused considering only one of the sample boxes had been used.

## 2015-11-27 ENCOUNTER — Other Ambulatory Visit: Payer: Self-pay | Admitting: Medical

## 2015-11-27 MED ORDER — ESOMEPRAZOLE MAGNESIUM 20 MG PO PACK: 20.0000 mg | PACK | Freq: Every day | ORAL | Status: DC

## 2015-11-27 NOTE — Telephone Encounter (Signed)
I think they have them confused.  Nexium shouldn't cause sedation but Ultram can.  Ultram is prn/as needed for pain, not daily uses.  Nexium on the other hand would be daily until she ran out of samples.   I would recommend she stay on an acid medication so I'll send Nexium to pharmacy.

## 2015-11-27 NOTE — Telephone Encounter (Signed)
i think shane should have sent this to you

## 2015-11-27 NOTE — Telephone Encounter (Signed)
LMTCB

## 2015-11-28 ENCOUNTER — Ambulatory Visit: Payer: BLUE CROSS/BLUE SHIELD

## 2015-11-29 ENCOUNTER — Ambulatory Visit (INDEPENDENT_AMBULATORY_CARE_PROVIDER_SITE_OTHER): Payer: BLUE CROSS/BLUE SHIELD | Admitting: Obstetrics and Gynecology

## 2015-11-29 ENCOUNTER — Encounter: Payer: Self-pay | Admitting: Obstetrics and Gynecology

## 2015-11-29 VITALS — BP 96/60 | HR 80 | Resp 16 | Ht 63.75 in | Wt 162.0 lb

## 2015-11-29 DIAGNOSIS — R102 Pelvic and perineal pain: Secondary | ICD-10-CM

## 2015-11-29 DIAGNOSIS — L292 Pruritus vulvae: Secondary | ICD-10-CM

## 2015-11-29 DIAGNOSIS — N819 Female genital prolapse, unspecified: Secondary | ICD-10-CM | POA: Diagnosis not present

## 2015-11-29 DIAGNOSIS — R1030 Lower abdominal pain, unspecified: Secondary | ICD-10-CM | POA: Diagnosis not present

## 2015-11-29 DIAGNOSIS — K5900 Constipation, unspecified: Secondary | ICD-10-CM

## 2015-11-29 MED ORDER — BETAMETHASONE VALERATE 0.1 % EX OINT: TOPICAL_OINTMENT | CUTANEOUS | Status: DC

## 2015-11-29 NOTE — Telephone Encounter (Signed)
LMTCB

## 2015-11-29 NOTE — Patient Instructions (Signed)
Take miralax every day

## 2015-11-29 NOTE — Progress Notes (Signed)
Patient ID: Becky Goodwin, female   DOB: [birth year], 50 y.o.   MRN: [id number] 50 y.o. G3P3003 MarriedAsianF here c/o ongoing abdominal pain.  The patient is sent for a consultation from Crosby Oyster, PA for evaluation of lower abdominal pain. She has been having pain in her lower abdomen for a month or so. R>L. The pain is always there on the right and comes and goes on the left. She describes it as a sharp pain, ranges from moderate to severe. She still functions, states she can't miss work.  No fevers, chills, nausea, emesis, vomiting, diarrhea. She does have some constipation. She takes green tea for the constipation. She has a BM every 3-4 days. No change in BM since prior to her pain. No urinary problems. She has some vaginal itching for the last year. No discharge.  After questioning, she reports a long h/o genital prolapse, since the birth of her last child. Not bothersome. PMP x 2 + years. She denies vaginal bleeding    Interpretor used ID # [id number]   Patient's last menstrual period was 10/06/2012.          Sexually active: No.  The current method of family planning is postmenopause/ abstinence.    Exercising: No.  The patient does not participate in regular exercise at present. Smoker:  no  Health Maintenance: Pap:  12/22/13 WNL NEG HR HPV History of abnormal Pap:  unknown MMG:  04-05-15 WNL Colonoscopy:  Never BMD:   Never TDaP:  03-14-10 Gardasil: N/A   reports that she has never smoked. She has never used smokeless tobacco. She reports that she does not drink alcohol or use illicit drugs.  Past Medical History  Diagnosis Date  . Hypertension   . Chronic neck pain   . Shoulder pain     Past Surgical History  Procedure Laterality Date  . Tonsillectomy      Right tonsillectomy, 2wk hospitalization ([facility])  . Cyst excision      upper chest/ breast ([facility]), 1 week hospitalization  . Jejunostomy feeding tube      prior with surgery  . Breast lumpectomy      Current Outpatient  Prescriptions  Medication Sig Dispense Refill  . lisinopril-hydrochlorothiazide (PRINZIDE,ZESTORETIC) 10-12.5 MG tablet Take 1 tablet by mouth daily. 90 tablet 3  . betamethasone valerate ointment (VALISONE) 0.1 % Apply a pea sized amount topically BID x 1-2 weeks 15 g 0  . esomeprazole (NEXIUM) 20 MG packet Take 20 mg by mouth daily before breakfast. 30 each 2  . oseltamivir (TAMIFLU) 75 MG capsule   0  . polyethylene glycol powder (GLYCOLAX/MIRALAX) powder 1 cap full daily (Patient not taking: Reported on 02/27/2015) 3350 g 0  . traMADol (ULTRAM) 50 MG tablet Take 1 tablet (50 mg total) by mouth every 6 (six) hours as needed. 30 tablet 0   No current facility-administered medications for this visit.    Family History  Problem Relation Age of Onset  . Hypertension Father   . Cancer Neg Hx   . Diabetes Neg Hx   . Heart disease Neg Hx   . Stroke Neg Hx     Review of Systems  Constitutional: Negative.   HENT: Negative.   Eyes: Negative.   Respiratory: Negative.   Cardiovascular: Negative.   Gastrointestinal: Positive for abdominal pain.  Endocrine: Negative.   Genitourinary: Negative.   Musculoskeletal: Negative.   Skin: Negative.   Allergic/Immunologic: Negative.   Neurological: Negative.   Psychiatric/Behavioral: Negative.     Exam:  BP 96/60 mmHg  Pulse 80  Resp 16  Ht 5' 3.75" (1.619 m)  Wt 162 lb (73.483 kg)  BMI 28.03 kg/m2  LMP 10/06/2012  Weight change: @WEIGHTCHANGE @ Height:   Height: 5' 3.75" (161.9 cm)  Ht Readings from Last 3 Encounters:  11/29/15 5' 3.75" (1.619 m)  10/27/15 5\' 5"  (1.651 m)  12/10/13 5\' 4"  (1.626 m)    General appearance: alert, cooperative and appears stated age Head: Normocephalic, without obvious abnormality, atraumatic Neck: no adenopathy, supple, symmetrical, trachea midline and thyroid normal to inspection and palpation Lungs: clear to auscultation bilaterally Breasts: normal appearance, no masses or tenderness, evidence of  right breast biopsy Heart: regular rate and rhythm Abdomen: soft, mildly tender BLQ, R>L, no rebound, no guarding,  no masses,  no organomegaly Extremities: extremities normal, atraumatic, no cyanosis or edema Skin: Skin color, texture, turgor normal. No rashes or lesions Lymph nodes: Cervical, supraclavicular, and axillary nodes normal. No abnormal inguinal nodes palpated Neurologic: Grossly normal   Pelvic: External genitalia:  no lesions              Urethra:  normal appearing urethra with no masses, tenderness or lesions              Bartholins and Skenes: normal                 Vagina: normal appearing vagina with normal color and discharge, no lesions. Grade 2 cystocele, grade 1 uterine prolapse (only examined supine with and without valsalva, not   symptomatic, not checked while standing.               Cervix: no lesions, pedunculated nabothian cyst               Bimanual Exam:  Uterus:  anteverted, mobile, not appreciably enlarged.               Adnexa: fullness in the right adnexa, tender in bilateral adnexa (mild)               Rectovaginal: Confirms               Anus:  normal sphincter tone, no lesions  Chaperone was present for exam.  A:  Abdominal/pelvic pain, R>L, some fullness on pelvic exam, ?stool vs adnexa  Constipation, she has not been taking miralax as recommended  Vulvar pruritus, normal exam  Genital prolapse, patient states long term, not bothersome    P:   Return for a pelvic ultrasound  Recommended she start taking Miralax daily  Send wet prep probe  Will treat pruritus with a short course of steroid ointment  Discussion and recommendations made with the help of an interpreter   CC: Crosby Oysteravid Tysinger, PA Letter sent

## 2015-11-30 ENCOUNTER — Telehealth: Payer: Self-pay

## 2015-11-30 ENCOUNTER — Telehealth: Payer: Self-pay | Admitting: Obstetrics and Gynecology

## 2015-11-30 LAB — WET PREP BY MOLECULAR PROBE
Candida species: NEGATIVE
Gardnerella vaginalis: POSITIVE — AB
TRICHOMONAS VAG: NEGATIVE

## 2015-11-30 MED ORDER — METRONIDAZOLE 500 MG PO TABS
500.0000 mg | ORAL_TABLET | Freq: Two times a day (BID) | ORAL | Status: DC
Start: 2015-11-30 — End: 2015-12-12

## 2015-11-30 NOTE — Telephone Encounter (Signed)
Spoke with patient's husband Chhatra, okay per ROI. Advised of results as seen below from Dr.Jertson. ETOH precautions given. He is agreeable and verbalizes understanding and will notify the patient. Rx for Flagyl 500 mg BID x 7 days #14 0RF sent to pharmacy on file.  Routing to provider for final review. Patient agreeable to disposition. Will close encounter.

## 2015-11-30 NOTE — Telephone Encounter (Signed)
Left message to call Kaytlynn Kochan at [phone]. 

## 2015-11-30 NOTE — Telephone Encounter (Signed)
-----   Message from Romualdo BolkJill Evelyn Jertson, MD sent at 11/30/2015  8:44 AM EDT ----- Please inform the patient that her vaginitis probe was + for BV and treat with flagyl, no ETOH while on Flagyl.  Oral: Flagyl 500 mg BID x 7 days She will need an interpreter to explain this to her. See information from yesterdays visit. Thanks!

## 2015-11-30 NOTE — Telephone Encounter (Signed)
Spoke with patients [place] ([place] regarding benefit for ultrasound. Patient understood and agreeable. Patient ready to schedule. Patient scheduled 12/05/15 with Dr Oscar LaJertson. Pt aware of arrival date and time. Pt aware of 72 hours cancellation policy with $100 fee. No further questions. Ok to close

## 2015-11-30 NOTE — Telephone Encounter (Signed)
Called patient to review benefits for a recommended procedure. Left Voicemail requesting a call back. °

## 2015-12-01 ENCOUNTER — Telehealth: Payer: Self-pay | Admitting: Obstetrics and Gynecology

## 2015-12-01 NOTE — Telephone Encounter (Signed)
Call to patient to reschedule pus appointment. Language barrier for [place]. Language line required. Representative attempted 3 way call to assist rescheduling. Left voicemail for patient to return call.

## 2015-12-04 NOTE — Telephone Encounter (Signed)
Sent letter

## 2015-12-05 ENCOUNTER — Other Ambulatory Visit: Payer: BLUE CROSS/BLUE SHIELD

## 2015-12-05 ENCOUNTER — Other Ambulatory Visit: Payer: BLUE CROSS/BLUE SHIELD | Admitting: Obstetrics and Gynecology

## 2015-12-05 ENCOUNTER — Ambulatory Visit: Payer: BLUE CROSS/BLUE SHIELD | Admitting: Physical Therapy

## 2015-12-12 ENCOUNTER — Encounter: Payer: Self-pay | Admitting: Obstetrics and Gynecology

## 2015-12-12 ENCOUNTER — Ambulatory Visit (INDEPENDENT_AMBULATORY_CARE_PROVIDER_SITE_OTHER): Payer: BLUE CROSS/BLUE SHIELD

## 2015-12-12 ENCOUNTER — Ambulatory Visit (INDEPENDENT_AMBULATORY_CARE_PROVIDER_SITE_OTHER): Payer: BLUE CROSS/BLUE SHIELD | Admitting: Obstetrics and Gynecology

## 2015-12-12 VITALS — BP 92/60 | HR 72 | Resp 16 | Ht 63.75 in | Wt 163.0 lb

## 2015-12-12 DIAGNOSIS — R1030 Lower abdominal pain, unspecified: Secondary | ICD-10-CM

## 2015-12-12 DIAGNOSIS — R102 Pelvic and perineal pain: Secondary | ICD-10-CM | POA: Diagnosis not present

## 2015-12-12 DIAGNOSIS — Z1211 Encounter for screening for malignant neoplasm of colon: Secondary | ICD-10-CM | POA: Diagnosis not present

## 2015-12-12 DIAGNOSIS — K5900 Constipation, unspecified: Secondary | ICD-10-CM | POA: Diagnosis not present

## 2015-12-12 NOTE — Progress Notes (Signed)
U/S note: gallbladder and right kidney also appear WNL. [facility]  GYNECOLOGY  VISIT   HPI: 50 y.o.   Married  Asian  female   [phone] [id number] with Patient's last menstrual period was 10/06/2012.   here for   US  GYNECOLOGIC HISTORY: Patient's last menstrual period was 10/06/2012. Contraception:Postmenopausal Menopausal hormone therapy: None        OB History    Gravida Para Term Preterm AB TAB SAB Ectopic Multiple Living   3 3 3       3          Patient Active Problem List   Diagnosis Date Noted  . Pelvic pain in female 10/27/2015  . AP (abdominal pain) 10/27/2015  . Chronic right shoulder pain 10/27/2015  . Encounter for health maintenance examination in adult 10/27/2015  . Special screening for malignant neoplasms, colon 10/27/2015  . Screening for breast cancer 10/27/2015  . Essential hypertension 10/27/2015    Past Medical History  Diagnosis Date  . Hypertension   . Chronic neck pain   . Shoulder pain     Past Surgical History  Procedure Laterality Date  . Tonsillectomy      Right tonsillectomy, 2wk hospitalization ([facility])  . Cyst excision      upper chest/ breast ([facility]), 1 week hospitalization  . Jejunostomy feeding tube      prior with surgery  . Breast lumpectomy      Current Outpatient Prescriptions  Medication Sig Dispense Refill  . betamethasone valerate ointment (VALISONE) 0.1 % Apply a pea sized amount topically BID x 1-2 weeks 15 g 0  . esomeprazole (NEXIUM) 20 MG packet Take 20 mg by mouth daily before breakfast. 30 each 2  . lisinopril-hydrochlorothiazide (PRINZIDE,ZESTORETIC) 10-12.5 MG tablet Take 1 tablet by mouth daily. 90 tablet 3  . metroNIDAZOLE (FLAGYL) 500 MG tablet Take 1 tablet (500 mg total) by mouth 2 (two) times daily. 14 tablet 0  . oseltamivir (TAMIFLU) 75 MG capsule   0  . polyethylene glycol powder (GLYCOLAX/MIRALAX) powder 1 cap full daily (Patient not taking: Reported on 02/27/2015) 3350 g 0  . traMADol (ULTRAM) 50 MG tablet Take 1  tablet (50 mg total) by mouth every 6 (six) hours as needed. 30 tablet 0   No current facility-administered medications for this visit.     ALLERGIES: Hydrocodone  Family History  Problem Relation Age of Onset  . Hypertension Father   . Cancer Neg Hx   . Diabetes Neg Hx   . Heart disease Neg Hx   . Stroke Neg Hx     Social History   Social History  . Marital Status: Married    Spouse Name: N/A  . Number of Children: N/A  . Years of Education: N/A   Occupational History  . Not on file.   Social History Main Topics  . Smoking status: Never Smoker   . Smokeless tobacco: Never Used  . Alcohol Use: No  . Drug Use: No  . Sexual Activity:    Partners: Male    Birth Control/ Protection: None   Other Topics Concern  . Not on file   Social History Narrative   Lives with husband, 3 children, ages 50yo, 50yo, 50yo, works in Designer, fashion/clothingtextiles, various jobs, some exercise with walking    Review of Systems  Constitutional: Negative.   HENT: Negative.   Eyes: Negative.   Respiratory: Negative.   Cardiovascular: Positive for leg swelling.  Gastrointestinal: Positive for abdominal pain.  Right side abdominal pain  Genitourinary:       Loss of sexual interest  Musculoskeletal:       Right hand and shoulder pain  Skin: Negative.   Neurological: Negative.   Endo/Heme/Allergies: Negative.   Psychiatric/Behavioral: Negative.     PHYSICAL EXAMINATION:    BP 92/60 mmHg  Pulse 72  Resp 16  Wt 163 lb (73.936 kg)  LMP 10/06/2012    General appearance: alert, cooperative and appears stated age  ASSESSMENT 50 year old female with a one month h/o BLQ abdominal/pelvic pain. She had some fullness and tenderness on adnexal exam. Normal GYN ultrasound today Constipation, long term    PLAN Refer to GI for consultation for pain, constipation and colonoscopy   An After Visit Summary was printed and given to the patient.  10 minutes face to face time of which over 50% was spent  in counseling. Interpreter was present    CC: Crosby Oyster, PA

## 2016-01-24 ENCOUNTER — Ambulatory Visit (INDEPENDENT_AMBULATORY_CARE_PROVIDER_SITE_OTHER): Payer: BLUE CROSS/BLUE SHIELD | Admitting: Nurse Practitioner

## 2016-01-24 ENCOUNTER — Encounter: Payer: Self-pay | Admitting: Nurse Practitioner

## 2016-01-24 VITALS — BP 102/64 | HR 84 | Temp 98.2°F | Resp 14 | Wt 160.0 lb

## 2016-01-24 DIAGNOSIS — R300 Dysuria: Secondary | ICD-10-CM | POA: Diagnosis not present

## 2016-01-24 LAB — CBC
HCT: 38.7 % (ref 35.0–45.0)
HEMOGLOBIN: 12.7 g/dL (ref 11.7–15.5)
MCH: 28.7 pg (ref 27.0–33.0)
MCHC: 32.8 g/dL (ref 32.0–36.0)
MCV: 87.4 fL (ref 80.0–100.0)
MPV: 9.6 fL (ref 7.5–12.5)
Platelets: 269 10*3/uL (ref 140–400)
RBC: 4.43 MIL/uL (ref 3.80–5.10)
RDW: 13.7 % (ref 11.0–15.0)
WBC: 9 10*3/uL (ref 3.8–10.8)

## 2016-01-24 LAB — POCT URINALYSIS DIPSTICK
BILIRUBIN UA: NEGATIVE
Glucose, UA: NEGATIVE
KETONES UA: NEGATIVE
PH UA: 6.5
Protein, UA: NEGATIVE
Urobilinogen, UA: NEGATIVE

## 2016-01-24 MED ORDER — PHENAZOPYRIDINE HCL 200 MG PO TABS: 200.0000 mg | ORAL_TABLET | Freq: Three times a day (TID) | ORAL | Status: DC | PRN

## 2016-01-24 MED ORDER — CIPROFLOXACIN HCL 500 MG PO TABS: 500.0000 mg | ORAL_TABLET | Freq: Two times a day (BID) | ORAL | Status: DC

## 2016-01-24 NOTE — Progress Notes (Signed)
Subjective:     Patient ID: Becky Goodwin, female   DOB: 11/21/1965, 50 y.o.   MRN: [id number]  Dysuria  Associated symptoms include chills, flank pain, frequency and urgency.    This 50 yo female G3P3 from [place] presents as urgent visit for dysuria.  Husband who speaks some English reports that symptoms started on Saturday.  She had lower pelvic pressure with urgency and frequency.  Later increase in dysuria.  Over the weekend had some chills ? Fever.  Some back pain but not sure if related to flank pain.  She denies vaginal symptoms and not sure with communication barrier if symptoms are related to SA.   Review of Systems  Constitutional: Positive for fever, chills and fatigue.  Respiratory: Negative.   Cardiovascular: Negative.   Gastrointestinal: Negative.   Genitourinary: Positive for dysuria, urgency, frequency, flank pain and decreased urine volume.  Musculoskeletal: Positive for back pain and arthralgias.  Neurological: Positive for weakness.  Hematological: Negative.   Psychiatric/Behavioral: Negative.        Objective:   Physical Exam  Constitutional: She is oriented to person, place, and time. She appears well-developed and well-nourished. No distress.  No distress but obviously does not feel well.  Cardiovascular: Normal rate.   Pulmonary/Chest: Effort normal.  Abdominal: Soft. She exhibits no distension and no mass. There is tenderness. There is no rebound and no guarding.  Tenderness is over the suprapubic area. Back pain is central back and not at flank areas.  Genitourinary:  Declines and states no discharge.  Neurological: She is alert and oriented to person, place, and time.  Psychiatric: She has a normal mood and affect. Her behavior is normal. Judgment and thought content normal.   UA:  2 + WB, 3+ RBC, ++Nitrate, strong odor and cloudy urine    Assessment:     R/O UTI Concerns because of language barriers if she feels that bad    Plan:     Will get CBC and  follow Will start her on Cipro 500 mg BID # 14 Pyridium 200 mg TID prn Will follow with urine results Husband to take her to ED if symptoms worsen She is given a note to be out of work X 2 days secondary to her symptoms and she obviously does not feel well.

## 2016-01-24 NOTE — Patient Instructions (Signed)

## 2016-01-25 ENCOUNTER — Ambulatory Visit: Payer: BLUE CROSS/BLUE SHIELD | Admitting: Certified Nurse Midwife

## 2016-01-25 LAB — URINALYSIS, MICROSCOPIC ONLY
CASTS: NONE SEEN [LPF]
Crystals: NONE SEEN [HPF]
RBC / HPF: >60 RBC/HPF — AB (ref <=2)
WBC, UA: >60 WBC/HPF — AB (ref <=5)
YEAST: NONE SEEN [HPF]

## 2016-01-26 ENCOUNTER — Encounter: Payer: Self-pay | Admitting: Nurse Practitioner

## 2016-01-26 ENCOUNTER — Ambulatory Visit (INDEPENDENT_AMBULATORY_CARE_PROVIDER_SITE_OTHER): Payer: BLUE CROSS/BLUE SHIELD | Admitting: Nurse Practitioner

## 2016-01-26 VITALS — BP 110/60 | HR 86 | Temp 97.7°F | Resp 16 | Ht 63.75 in | Wt 159.0 lb

## 2016-01-26 DIAGNOSIS — N390 Urinary tract infection, site not specified: Secondary | ICD-10-CM | POA: Diagnosis not present

## 2016-01-26 LAB — POCT URINALYSIS DIPSTICK
Blood, UA: NEGATIVE
LEUKOCYTES UA: NEGATIVE
Urobilinogen, UA: NEGATIVE

## 2016-01-26 LAB — URINE CULTURE

## 2016-01-26 NOTE — Progress Notes (Signed)
50 y.o. Married from [place] female [id number] here for follow up of UTI treated with Cipro and Pyridium initiated on 01/24/16.  While here on Wednesday she felt very bad and had a lot of lower abdominal pain and pressure.  Those symptoms are gone.  She has concerns because her urine is changed in color an thinks she is passing blood.  Denies further fever and chills.  No past medical history of renal calculi.  She has remained out of work x 2 days and overall is much better.  Language interpreter for Nepali code # [id number].  All of pt concerns, directions, exam, and review of labs and current finding done through interpreter. Pt daughter also here and is fairly fluent in [place]   O: Healthy WD,WN female Affect: normal Abdomen:soft, non tender, no flank pain  A: UTI with > 100,000 colonies of gram neg but final culture not back  Change in urine color from the Pyridium    P:  Discussed findings of changes with color due to medication.  I feel that her medication is appropriate since she is feeling better.   Labs :  Urine chemstrip without RBC and changes C/W Pyridium  Instructions given regarding: still increase fluids and rest over the weekend.  RV

## 2016-01-27 NOTE — Progress Notes (Signed)
Encounter reviewed by Dr. Brook Amundson C. Silva.  

## 2016-01-29 NOTE — Progress Notes (Signed)
Encounter reviewed by Dr. Deondria Puryear Amundson C. Silva.  

## 2016-04-16 ENCOUNTER — Other Ambulatory Visit: Payer: Self-pay | Admitting: Medical

## 2016-04-16 DIAGNOSIS — Z1231 Encounter for screening mammogram for malignant neoplasm of breast: Secondary | ICD-10-CM

## 2016-04-30 ENCOUNTER — Ambulatory Visit: Payer: BLUE CROSS/BLUE SHIELD

## 2016-04-30 ENCOUNTER — Ambulatory Visit
Admission: RE | Admit: 2016-04-30 | Discharge: 2016-04-30 | Disposition: A | Discharge status: Home / Self Care | Payer: PRIVATE HEALTH INSURANCE | Source: Ambulatory Visit | Attending: Medical | Admitting: Medical

## 2016-04-30 DIAGNOSIS — Z1231 Encounter for screening mammogram for malignant neoplasm of breast: Secondary | ICD-10-CM

## 2016-05-03 ENCOUNTER — Encounter: Payer: Self-pay | Admitting: Internal Medicine

## 2016-05-08 ENCOUNTER — Other Ambulatory Visit: Payer: Self-pay | Admitting: Medical

## 2016-07-03 ENCOUNTER — Telehealth: Payer: Self-pay | Admitting: Medical

## 2016-07-03 DIAGNOSIS — M25511 Pain in right shoulder: Secondary | ICD-10-CM

## 2016-07-03 NOTE — Telephone Encounter (Signed)
Ok, please make new referral

## 2016-07-03 NOTE — Telephone Encounter (Signed)
Pt never went to ortho appt in April and [facility] has advised that pt will need an updated referral in order to reschedule appt. Requesting new referral for Ortho Eval.

## 2016-07-08 NOTE — Telephone Encounter (Signed)
Internal referral entered for PT to contact pt to schedule pt. Becky Goodwin/RLB

## 2016-07-10 ENCOUNTER — Ambulatory Visit (HOSPITAL_COMMUNITY)
Admission: EM | Admit: 2016-07-10 | Discharge: 2016-07-10 | Disposition: A | Discharge status: Home / Self Care | Payer: PRIVATE HEALTH INSURANCE | Attending: Family Medicine | Admitting: Family Medicine

## 2016-07-10 ENCOUNTER — Encounter (HOSPITAL_COMMUNITY): Payer: Self-pay | Admitting: Emergency Medicine

## 2016-07-10 DIAGNOSIS — J020 Streptococcal pharyngitis: Secondary | ICD-10-CM

## 2016-07-10 MED ORDER — CEFDINIR 300 MG PO CAPS: 300.0000 mg | ORAL_CAPSULE | Freq: Two times a day (BID) | ORAL | 0 refills | Status: DC

## 2016-07-10 MED ORDER — ACETAMINOPHEN 325 MG PO TABS
ORAL_TABLET | ORAL | Status: AC
  Filled 2016-07-10: qty 3

## 2016-07-10 MED ORDER — ACETAMINOPHEN 500 MG PO TABS
1000.0000 mg | ORAL_TABLET | Freq: Once | ORAL | Status: AC
  Administered 2016-07-10: 1000 mg via ORAL

## 2016-07-10 NOTE — Discharge Instructions (Signed)
Drink lots of fluids, take all of medicine, use lozenges as needed.return if needed °

## 2016-07-10 NOTE — ED Provider Notes (Signed)
[facility]    CSN: [id number] Arrival date & time: 07/10/16  1837     History   Chief Complaint Chief Complaint  Patient presents with  . Chest Pain    HPI Becky Goodwin is a 50 y.o. female.   The history is provided by the patient and the spouse. The history is limited by a language barrier. A language interpreter was used.  Fever  Temp source:  Subjective Severity:  Moderate Onset quality:  Sudden Duration:  1 day Progression:  Unchanged Chronicity:  New Relieved by:  None tried Worsened by:  Nothing Ineffective treatments:  None tried Associated symptoms: chills, congestion, myalgias, rhinorrhea and sore throat   Associated symptoms: no cough, no diarrhea, no nausea and no vomiting     Past Medical History:  Diagnosis Date  . Chronic neck pain   . Hypertension   . Shoulder pain     Patient Active Problem List   Diagnosis Date Noted  . Pelvic pain in female 10/27/2015  . AP (abdominal pain) 10/27/2015  . Chronic right shoulder pain 10/27/2015  . Encounter for health maintenance examination in adult 10/27/2015  . Special screening for malignant neoplasms, colon 10/27/2015  . Screening for breast cancer 10/27/2015  . Essential hypertension 10/27/2015    Past Surgical History:  Procedure Laterality Date  . BREAST LUMPECTOMY    . CYST EXCISION     upper chest/ breast ([facility]), 1 week hospitalization  . JEJUNOSTOMY FEEDING TUBE     prior with surgery  . TONSILLECTOMY     Right tonsillectomy, 2wk hospitalization ([facility])    OB History    Gravida Para Term Preterm AB Living   3 3 3     3    SAB TAB Ectopic Multiple Live Births           3       Home Medications    Prior to Admission medications   Medication Sig Start Date End Date Taking? Authorizing Provider  betamethasone valerate ointment (VALISONE) 0.1 % Apply a pea sized amount topically BID x 1-2 weeks 11/29/15   Romualdo BolkJill Evelyn Jertson, MD  ciprofloxacin (CIPRO) 500 MG tablet Take 1  tablet (500 mg total) by mouth 2 (two) times daily. 01/24/16   Ria CommentPatricia Grubb, FNP  esomeprazole (NEXIUM) 20 MG packet Take 20 mg by mouth daily before breakfast. 11/27/15   Kermit Baloavid S Tysinger, PA-C  lisinopril-hydrochlorothiazide (PRINZIDE,ZESTORETIC) 10-12.5 MG tablet Take 1 tablet by mouth daily. 10/27/15   Kermit Baloavid S Tysinger, PA-C  lisinopril-hydrochlorothiazide (PRINZIDE,ZESTORETIC) 10-12.5 MG tablet TAKE ONE TABLET BY MOUTH ONCE DAILY 05/09/16   Kermit Baloavid S Tysinger, PA-C  phenazopyridine (PYRIDIUM) 200 MG tablet Take 1 tablet (200 mg total) by mouth 3 (three) times daily as needed for pain. 01/24/16   Ria CommentPatricia Grubb, FNP  polyethylene glycol powder [facility](GLYCOLAX/MIRALAX) powder 1 cap full daily 10/31/14   Kermit Baloavid S Tysinger, PA-C  traMADol (ULTRAM) 50 MG tablet Take 1 tablet (50 mg total) by mouth every 6 (six) hours as needed. 10/27/15   Jac Canavanavid S Tysinger, PA-C    Family History Family History  Problem Relation Age of Onset  . Hypertension Father   . Cancer Neg Hx   . Diabetes Neg Hx   . Heart disease Neg Hx   . Stroke Neg Hx     Social History Social History  Substance Use Topics  . Smoking status: Never Smoker  . Smokeless tobacco: Never Used  . Alcohol use No     Allergies  Hydrocodone   Review of Systems Review of Systems  Constitutional: Positive for chills and fever.  HENT: Positive for congestion, postnasal drip, rhinorrhea and sore throat.   Respiratory: Negative.  Negative for cough.   Cardiovascular: Negative.   Gastrointestinal: Negative.  Negative for diarrhea, nausea and vomiting.  Musculoskeletal: Positive for myalgias.     Physical Exam Triage Vital Signs ED Triage Vitals  Enc Vitals Group     BP 07/10/16 1915 101/63     Pulse Rate 07/10/16 1915 115     Resp 07/10/16 1915 20     Temp 07/10/16 1915 102.7 F (39.3 C)     Temp Source 07/10/16 1915 Oral     SpO2 07/10/16 1915 100 %     Weight 07/10/16 1916 159 lb (72.1 kg)     Height --      Head Circumference --       Peak Flow --      Pain Score --      Pain Loc --      Pain Edu? --      Excl. in GC? --    No data found.   Updated Vital Signs BP 101/63   Pulse 115   Temp 102.7 F (39.3 C) (Oral)   Resp 20   Wt 159 lb (72.1 kg)   LMP 10/06/2012   SpO2 100%   BMI 27.51 kg/m   Visual Acuity Right Eye Distance:   Left Eye Distance:   Bilateral Distance:    Right Eye Near:   Left Eye Near:    Bilateral Near:     Physical Exam  Constitutional: She is oriented to person, place, and time. She appears well-developed and well-nourished. She appears distressed.  HENT:  Right Ear: External ear normal.  Left Ear: External ear normal.  Mouth/Throat: Oropharyngeal exudate present.  Cardiovascular: Normal rate, regular rhythm and normal heart sounds.   Pulmonary/Chest: Effort normal and breath sounds normal.  Abdominal: Soft. Bowel sounds are normal.  Lymphadenopathy:    She has cervical adenopathy.  Neurological: She is alert and oriented to person, place, and time.  Skin: Skin is warm and dry.  Nursing note and vitals reviewed.    UC Treatments / Results  Labs (all labs ordered are listed, but only abnormal results are displayed) Labs Reviewed - No data to display  EKG  EKG Interpretation None       Radiology No results found.  Procedures Procedures (including critical care time)  Medications Ordered in UC Medications - No data to display   Initial Impression / Assessment and Plan / UC Course  I have reviewed the triage vital signs and the nursing notes.  Pertinent labs & imaging results that were available during my care of the patient were reviewed by me and considered in my medical decision making (see chart for details).  Clinical Course       Final Clinical Impressions(s) / UC Diagnoses   Final diagnoses:  None    New Prescriptions New Prescriptions   No medications on file     Linna HoffJames D Kindl, MD 07/10/16 1939

## 2016-07-12 ENCOUNTER — Ambulatory Visit (INDEPENDENT_AMBULATORY_CARE_PROVIDER_SITE_OTHER): Payer: BLUE CROSS/BLUE SHIELD | Admitting: Medical

## 2016-07-12 ENCOUNTER — Encounter: Payer: Self-pay | Admitting: Medical

## 2016-07-12 ENCOUNTER — Ambulatory Visit: Payer: BLUE CROSS/BLUE SHIELD | Admitting: Medical

## 2016-07-12 VITALS — BP 110/60 | HR 86 | Temp 98.5°F | Wt 158.6 lb

## 2016-07-12 DIAGNOSIS — R059 Cough, unspecified: Secondary | ICD-10-CM

## 2016-07-12 DIAGNOSIS — R51 Headache: Secondary | ICD-10-CM | POA: Diagnosis not present

## 2016-07-12 DIAGNOSIS — Z23 Encounter for immunization: Secondary | ICD-10-CM

## 2016-07-12 DIAGNOSIS — R05 Cough: Secondary | ICD-10-CM | POA: Diagnosis not present

## 2016-07-12 DIAGNOSIS — J020 Streptococcal pharyngitis: Secondary | ICD-10-CM | POA: Diagnosis not present

## 2016-07-12 DIAGNOSIS — R519 Headache, unspecified: Secondary | ICD-10-CM

## 2016-07-12 NOTE — Progress Notes (Signed)
  Subjective: Becky Goodwin is a 50 y.o. female who presents with daughter who translates for evaluation of sore throat.  Was seen  2 days ago at urgent care for strep throat.  Has been having sore throat,t headache, chest discomfort, cough, body aches.   Was given Cefdinir 2 days ago, feels slight improvement of throat.   No fever, no NVD.  No other aggravating or relieving factors. No other complaint.  The following portions of the patient's history were reviewed and updated as appropriate: allergies, current medications, past medical history, past social history, past surgical history and problem list.  Past Medical History:  Diagnosis Date  . Chronic neck pain   . Hypertension   . Shoulder pain    Current Outpatient Prescriptions on File Prior to Visit  Medication Sig Dispense Refill  . cefdinir (OMNICEF) 300 MG capsule Take 1 capsule (300 mg total) by mouth 2 (two) times daily. 20 capsule 0  . ciprofloxacin (CIPRO) 500 MG tablet Take 1 tablet (500 mg total) by mouth 2 (two) times daily. 14 tablet 0  . lisinopril-hydrochlorothiazide (PRINZIDE,ZESTORETIC) 10-12.5 MG tablet Take 1 tablet by mouth daily. 90 tablet 3  . lisinopril-hydrochlorothiazide (PRINZIDE,ZESTORETIC) 10-12.5 MG tablet TAKE ONE TABLET BY MOUTH ONCE DAILY 90 tablet 1  . betamethasone valerate ointment (VALISONE) 0.1 % Apply a pea sized amount topically BID x 1-2 weeks 15 g 0   No current facility-administered medications on file prior to visit.     ROS as in subjective    Objective: BP 110/60   Pulse 86   Temp 98.5 F (36.9 C)   Wt 158 lb 9.6 oz (71.9 kg)   LMP 10/06/2012   SpO2 98%   BMI 27.44 kg/m   General appearance: no distress, WD/WN, mildly ill-appearing HEENT: normocephalic, conjunctiva/corneas normal, sclerae anicteric, nares patent, no discharge or erythema, pharynx with erythema, no exudate.  Oral cavity: MMM, no lesions  Neck: supple, no lymphadenopathy, no thyromegaly Heart: RRR, normal S1, S2, no  murmurs Lungs: CTA bilaterally, no wheezes, rhonchi, or rales   Assessment: Encounter Diagnoses  Name Primary?  . Streptococcal sore throat Yes  . Acute nonintractable headache, unspecified headache type   . Cough      Plan: No findings today that are worse than 2 days ago, reviewed urgent care notes.  advised she given the Cefdinir time to work, can use salt water gargles, warm fluids, chloraseptic spray, and begin Ibuprofen OTC 3 tablets BID for pain, fever, headache.  F/u prn.

## 2016-07-12 NOTE — Addendum Note (Signed)
Addended by: Winn JockVALENTINE, Therma Lasure N on: 07/12/2016 03:49 PM   Modules accepted: Orders

## 2016-07-16 ENCOUNTER — Ambulatory Visit (INDEPENDENT_AMBULATORY_CARE_PROVIDER_SITE_OTHER): Payer: BLUE CROSS/BLUE SHIELD | Admitting: Medical

## 2016-07-16 ENCOUNTER — Encounter: Payer: Self-pay | Admitting: Medical

## 2016-07-16 VITALS — BP 112/60 | HR 90 | Temp 98.3°F | Wt 158.6 lb

## 2016-07-16 DIAGNOSIS — J988 Other specified respiratory disorders: Secondary | ICD-10-CM | POA: Diagnosis not present

## 2016-07-16 DIAGNOSIS — R05 Cough: Secondary | ICD-10-CM

## 2016-07-16 DIAGNOSIS — R059 Cough, unspecified: Secondary | ICD-10-CM

## 2016-07-16 DIAGNOSIS — R0602 Shortness of breath: Secondary | ICD-10-CM | POA: Diagnosis not present

## 2016-07-16 MED ORDER — PROMETHAZINE-DM 6.25-15 MG/5ML PO SYRP: 5.0000 mL | ORAL_SOLUTION | Freq: Four times a day (QID) | ORAL | 0 refills | Status: DC | PRN

## 2016-07-16 MED ORDER — LEVOFLOXACIN 500 MG PO TABS: 500.0000 mg | ORAL_TABLET | Freq: Every day | ORAL | 0 refills | Status: DC

## 2016-07-16 NOTE — Patient Instructions (Addendum)
We are treated you for worsening respiratory infection  Continue to rest, drink plenty of water  Finish out the [place]mnicef antibiotic  Add the Levaquin antibiotic daily for 7 days  ADD cough syrup Promethazine DM  She can also use OTC Mucinex DM or Robitussin DM  DO NOT use sudafed, phenylephrine, or other sinus decongestants as they will raise the blood pressure  Use chloraseptic spray for throat pain  Use salt water gargles for throat pain  If not much improved by Thursday, then call back

## 2016-07-16 NOTE — Progress Notes (Signed)
Subjective: Becky Goodwin is a 50 y.o. female who presents with daughter Argentina PonderRadika who translates for evaluation of cough, sore throat.  Didn't sleep last night due to cough.  Still has bad sore throat, not eating much.  Drinking fluids though. Has productive cough.  Feels SOB with coughing.   I saw her last week, 2 days after she had been seen by urgent care.  She notes ongoing sore throat,t headache, chest discomfort, cough, body aches.   No fever, no NVD.  Was given Cefdinir last week.     No other aggravating or relieving factors. No other complaint.  The following portions of the patient's history were reviewed and updated as appropriate: allergies, current medications, past medical history, past social history, past surgical history and problem list.  Past Medical History:  Diagnosis Date  . Chronic neck pain   . Hypertension   . Shoulder pain    Current Outpatient Prescriptions on File Prior to Visit  Medication Sig Dispense Refill  . betamethasone valerate ointment (VALISONE) 0.1 % Apply a pea sized amount topically BID x 1-2 weeks 15 g 0  . cefdinir (OMNICEF) 300 MG capsule Take 1 capsule (300 mg total) by mouth 2 (two) times daily. 20 capsule 0  . lisinopril-hydrochlorothiazide (PRINZIDE,ZESTORETIC) 10-12.5 MG tablet Take 1 tablet by mouth daily. 90 tablet 3  . lisinopril-hydrochlorothiazide (PRINZIDE,ZESTORETIC) 10-12.5 MG tablet TAKE ONE TABLET BY MOUTH ONCE DAILY 90 tablet 1   No current facility-administered medications on file prior to visit.     ROS as in subjective    Objective: BP 112/60   Pulse 90   Temp 98.3 F (36.8 C)   Wt 158 lb 9.6 oz (71.9 kg)   LMP 10/06/2012   SpO2 90%   BMI 27.44 kg/m   Repeat pulse ox by me and after walking down the hall for a while showed 97% room air.  General appearance: no distress, WD/WN, somewhat ill-appearing, coughing a lot HEENT: normocephalic, conjunctiva/corneas normal, sclerae anicteric, nares patent, no discharge or  erythema, pharynx with erythema, no exudate.  Oral cavity: MMM, no lesions  Neck: supple, no lymphadenopathy, no thyromegaly Heart: RRR, normal S1, S2, no murmurs Lungs: CTA bilaterally, no wheezes, rhonchi, or rales   Assessment: Encounter Diagnoses  Name Primary?  Marland Kitchen. Respiratory tract infection Yes  . Cough   . SOB (shortness of breath)      Plan: Med changes today as below, rest, hydrate well, discussed supportive care.    discussed signs of worsening infection that would signal recheck.  Patient Instructions  We are treated you for worsening respiratory infection  Continue to rest, drink plenty of water  Finish out the [place]mnicef antibiotic  Add the Levaquin antibiotic daily for 7 days  ADD cough syrup Promethazine DM  She can also use OTC Mucinex DM or Robitussin DM  DO NOT use sudafed, phenylephrine, or other sinus decongestants as they will raise the blood pressure  Use chloraseptic spray for throat pain  Use salt water gargles for throat pain  If not much improved by Thursday, then call back     Natasha was seen today for sore throat,coughing.  Diagnoses and all orders for this visit:  Respiratory tract infection  Cough  SOB (shortness of breath)  Other orders -     promethazine-dextromethorphan (PROMETHAZINE-DM) 6.25-15 MG/5ML syrup; Take 5 mLs by mouth 4 (four) times daily as needed for cough. -     levofloxacin (LEVAQUIN) 500 MG tablet; Take 1 tablet (500 mg total)  by mouth daily.

## 2016-07-18 ENCOUNTER — Ambulatory Visit: Payer: BLUE CROSS/BLUE SHIELD | Attending: Medical | Admitting: Physical Therapy

## 2016-07-18 DIAGNOSIS — M25511 Pain in right shoulder: Secondary | ICD-10-CM | POA: Diagnosis not present

## 2016-07-18 DIAGNOSIS — R293 Abnormal posture: Secondary | ICD-10-CM | POA: Insufficient documentation

## 2016-07-18 DIAGNOSIS — M6281 Muscle weakness (generalized): Secondary | ICD-10-CM | POA: Insufficient documentation

## 2016-07-18 DIAGNOSIS — G8929 Other chronic pain: Secondary | ICD-10-CM

## 2016-07-18 NOTE — Therapy (Addendum)
[place], [place], [address] Phone: [phone]   Fax:  [phone]  Physical Therapy Evaluation / Discharge Note  Patient Details  Name: Chantel Teti MRN: [id number] Date of Birth: 06/27/1966 Referring Provider: Carlena Hurl, PA-C  Encounter Date: 07/18/2016      PT End of Session - 07/18/16 1513    Visit Number 1   Number of Visits 13   Date for PT Re-Evaluation 09/05/16   PT Start Time 5465   PT Stop Time 1501   PT Time Calculation (min) 43 min   Activity Tolerance Patient tolerated treatment well   Behavior During Therapy [facility] for tasks assessed/performed      Past Medical History:  Diagnosis Date  . Chronic neck pain   . Hypertension   . Shoulder pain     Past Surgical History:  Procedure Laterality Date  . BREAST LUMPECTOMY    . CYST EXCISION     upper chest/ breast ([place]), 1 week hospitalization  . JEJUNOSTOMY FEEDING TUBE     prior with surgery  . TONSILLECTOMY     Right tonsillectomy, 2wk hospitalization ([place])    There were no vitals filed for this visit.       Subjective Assessment - 07/18/16 1425    Subjective pt is a 50 y.o F With CC of R shoulder pain that started 7 years ago, with no specific onset of pain. reports pain startes in neck and referrs to the wrist. At night time hang numbness into the hand, and pain and cramping down the arm in to the hand. constant pain in the shoulder with that is worse with any movement.    Limitations Sitting;Lifting;House hold activities   How long can you sit comfortably? unlimited   How long can you stand comfortably? unlimited buthas pain inthe shoulder with activity   How long can you walk comfortably? unlimited buthas pain inthe shoulder with activity   Diagnostic tests x-ray 1 year ago.    Patient Stated Goals decrease pain, improvement motion, lifting and carrying   Currently in Pain? Yes   Pain Score 7    Pain Location  Shoulder   Pain Orientation Right   Pain Descriptors / Indicators Pins and needles;Shooting;Aching;Sore   Pain Type Chronic pain   Pain Radiating Towards from neck into the wrist of the R hand   Pain Onset More than a month ago   Pain Frequency Constant   Aggravating Factors  lifting, sitting, any movement of the shoulder   Pain Relieving Factors moving the hand, working,             [facility] PT Assessment - 07/18/16 1410      Assessment   Medical Diagnosis R shoulder pain   Referring Provider Camelia Eng Tysinger, PA-C   Onset Date/Surgical Date --  7 years ago   Hand Dominance Right   Next MD Visit make on PRN   Prior Therapy no     Precautions   Precautions None     Restrictions   Weight Bearing Restrictions No     Balance Screen   Has the patient fallen in the past 6 months No   Has the patient had a decrease in activity level because of a fear of falling?  No   Is the patient reluctant to leave their home because of a fear of falling?  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,      Cognition   Overall Cognitive Status Within Functional Limits for tasks assessed     Observation/Other Assessments   Focus on Therapeutic Outcomes (FOTO)  unable to do FOTO do to language not being supported     Posture/Postural Control   Posture/Postural Control Postural limitations   Postural Limitations Forward head;Rounded Shoulders     ROM / Strength   AROM / PROM / Strength AROM;PROM;Strength     AROM   AROM Assessment Site Shoulder   Right/Left Shoulder  Left;Right   Right Shoulder Extension 51 Degrees  pain during movement   Right Shoulder Flexion 136 Degrees  pain during movement   Right Shoulder ABduction 117 Degrees  pain during movement   Right Shoulder Internal Rotation 72 Degrees  pain end range   Right Shoulder External Rotation 86 Degrees  pain during movement   Left Shoulder Extension 62 Degrees   Left Shoulder Flexion 155 Degrees   Left Shoulder ABduction 120 Degrees   Left Shoulder Internal Rotation 88 Degrees   Left Shoulder External Rotation 82 Degrees     PROM   PROM Assessment Site Shoulder     Strength   Strength Assessment Site Shoulder   Right/Left Shoulder Right;Left   Right Shoulder Flexion 4/5  pain during movement   Right Shoulder Extension 4/5   Right Shoulder ABduction 4-/5   Right Shoulder Internal Rotation 4-/5   Right Shoulder External Rotation 4-/5   Left Shoulder Flexion 4/5   Left Shoulder Extension 4/5   Left Shoulder ABduction 4/5   Left Shoulder Internal Rotation 4-/5   Left Shoulder External Rotation 4-/5   Right Hand Grip (lbs) 43.6  50, 49, 32   Left Hand Grip (lbs) 50  56,50,44     Palpation   Palpation comment tightness in R upper trap, pain in the supraspinatus and lateral deltoid with referral to the deltoid tuberosity upon palpation, soreness at the greater tubercle on the R.      Special Tests    Special Tests Rotator Cuff Impingement   Rotator Cuff Impingment tests Michel Bickers test;Neer impingement test;Full Can test;Empty Can test;other;Painful Arc of Motion     Neer Impingement test    Findings Positive   Side Right     Hawkins-Kennedy test   Findings Positive   Side Right     Empty Can test   Findings Positive   Side Right   Comment worse than open can     Full Can test   Findings Positive   Side Right     Painful Arc of Motion   Findings Positive   Side Right     other   Findings Positive  scapular assist   Side Right                            PT Education - 07/18/16 1512    Education provided Yes   Education Details evaluation findings, POC, goals, HEP with specific cues for proper form and rationale.    Person(s) Educated  Patient   Methods Explanation;Verbal cues;Handout   Comprehension Verbalized understanding;Verbal cues required          PT Short Term Goals - 07/18/16 1752      PT SHORT TERM GOAL #1   Title pt will be I with inital HEP (08/08/2016)   Time 3   Period Weeks   Status New     PT SHORT TERM GOAL #2   Title pt will demonstrate proper posture / lifting and carrying mechanics to prevent and reduce pain (08/08/2016)   Time 3   Period Weeks   Status New     PT SHORT TERM GOAL #3   Title pt will improve her R grip strength by >/= 9# to demontrate improvement in shoulder function (08/08/2016)   Time 3   Period Weeks   Status New           PT Long Term Goals - 07/18/16 1755      PT LONG TERM GOAL #1   Title pt will be I with inital HEp (09/05/2016)   Time 6   Period Weeks   Status New     PT LONG TERM GOAL #2   Title she will improve her R shoulder AROM by >/= 8 degrees in all planes with </= 2/10 pain to improve functional AROM required for ADLs (09/05/2016)   Time 6   Period Weeks   Status New     PT LONG TERM GOAL #3   Title pt will increase R shoulder strength to >/= 4+/5 in all planes with </= 2/10 pain for functional lifting and carrying activities.    Time 6   Period Weeks   Status New     PT LONG TERM GOAL #4   Title she will be able to lift >/= 8# to and from an above head cabinet/ shelf with </= 2/10 pain for functional mobility and ADLs (09/05/2016)   Time 6   Period Weeks   Status New               Plan - 07/18/16 1514    Clinical Impression Statement Mrs. Feeley presents to OPPT as a low complexity evaluation with CC of chronic R pain. She demonstrates mild limitation of the R shoulder compared bil. Weakness is noted  with R shoulder with pain in all planes. She exhibits positive testing for impingement but  unable to rule out rotator cuff insufficency. she would benefit from physical therapy to reduce pain, improve strengthening, and maximize her function by addressing the deficits listed.      Rehab Potential Good   PT Frequency 2x / week   PT Duration 6 weeks   PT Treatment/Interventions ADLs/Self Care Home Management;Cryotherapy;Electrical Stimulation;Iontophoresis 59m/ml Dexamethasone;Moist Heat;Ultrasound;Dry needling;Taping;Stair training;Therapeutic activities;Therapeutic exercise;Manual techniques;Patient/family education   PT Next Visit Plan assess response to HEP, scapular stabilizers, thoracic mobuility, modalities PRN   PT Home Exercise Plan shoulder internal rotation, external rotations, rows, scapular retractions   Consulted and Agree with Plan of Care Patient;Family member/caregiver   Family Member Consulted Husband      Patient will benefit from skilled therapeutic intervention in order to improve the following deficits and impairments:  Abnormal gait, Pain, Decreased strength, Decreased endurance, Decreased activity tolerance  Visit Diagnosis: Chronic right shoulder pain - Plan: PT plan of care cert/re-cert  Muscle weakness (generalized) - Plan: PT plan of care cert/re-cert  Abnormal posture - Plan: PT plan of care cert/re-cert     Problem List Patient Active Problem List  Diagnosis Date Noted  . Pelvic pain in female 10/27/2015  . AP (abdominal pain) 10/27/2015  . Chronic right shoulder pain 10/27/2015  . Encounter for health maintenance examination in adult 10/27/2015  . Special screening for malignant neoplasms, colon 10/27/2015  . Screening for breast cancer 10/27/2015  . Essential hypertension 10/27/2015   Starr Lake PT, DPT, LAT, ATC  07/18/16  6:03 PM      [facility] [address] [place], [place],  [address] Phone: [phone]   Fax:  [phone]  Name: Mathilde Mcwherter MRN: [id number] Date of Birth: 03/30/1966     PHYSICAL THERAPY DISCHARGE SUMMARY  Visits from Start of Care: 1  Current functional level related to goals / functional outcomes: Only attended 1 visit   Remaining deficits: unknown   Education / Equipment: HEP  Plan: Patient agrees to discharge.  Patient goals were not met. Patient is being discharged due to not returning since the last visit.  ?????     Milaina Sher PT, DPT, LAT, ATC  09/09/16  1:17 PM

## 2016-11-06 ENCOUNTER — Other Ambulatory Visit: Payer: Self-pay | Admitting: Medical

## 2016-11-11 ENCOUNTER — Telehealth: Payer: Self-pay | Admitting: Medical

## 2016-11-11 NOTE — Telephone Encounter (Signed)
Pt's husband came in and stated that Becky Goodwin took BP at [facility] today and it was 91/90. Pt did not take her bp med. Pt's husband wanted to know if she should take med. Per [facility] pt is to monitor BP and if continues to stay low then she doesn't need to take. Pt's husband was informed to keep a log of readings and advise [place] next week.

## 2016-11-13 ENCOUNTER — Telehealth: Payer: Self-pay | Admitting: Medical

## 2016-11-13 NOTE — Telephone Encounter (Signed)
Pt's husband dropped off bp readings. She is still not taking her bp med. [facility] to [facility]enza Hews being out. Please advise pt.

## 2016-11-14 NOTE — Telephone Encounter (Signed)
Called pt and l/m for pt call us back.

## 2016-11-14 NOTE — Telephone Encounter (Signed)
Continue to have her hold her blood pressure medication.

## 2016-11-15 ENCOUNTER — Telehealth: Payer: Self-pay

## 2016-11-15 NOTE — Telephone Encounter (Signed)
husband informed to have wife hold her B/P meds he verbalized understanding

## 2016-11-15 NOTE — Telephone Encounter (Signed)
Pt's husband stopped by the office with reading of BP meds and readings are 11/10/16- 91/70, 11/11/2016- 110/77, 11/12/2016 - 109/77, 11/13/2016 -115/81, 11/14/2016 - 109/77. Husband inquires if pt needs to continue off of BP meds.  CB #  [id number]. Trixie Rude

## 2016-11-15 NOTE — Telephone Encounter (Signed)
Left message on preferred number for Sher do not take B/P med continue to hold

## 2016-11-18 ENCOUNTER — Telehealth: Payer: Self-pay | Admitting: Medical

## 2016-11-18 NOTE — Telephone Encounter (Signed)
Pt's husband dropped off bp readings. Pt continues to NOT take her bp meds. Please review readings. Sending to [employer] as Vincenza Hews still out of the office.

## 2016-11-19 NOTE — Telephone Encounter (Signed)
Called and spoke to husband and advised of Dr Jola Babinski instructions. He understood. They will continue to keep the BP reading log and bring it by in a few weeks for review.

## 2016-11-19 NOTE — Telephone Encounter (Signed)
I will continue to monitor the blood pressure. If still not at a range where I would do anything other than watch

## 2016-11-19 NOTE — Telephone Encounter (Signed)
I tried to talk to her husband he did not understand me I told him I would have you call him his wife is to stay off of B/P med

## 2016-11-20 ENCOUNTER — Telehealth: Payer: Self-pay | Admitting: Medical

## 2016-11-20 NOTE — Telephone Encounter (Signed)
her 

## 2016-11-20 NOTE — Telephone Encounter (Signed)
I reviewed the recent BP readings which are fine.  Lets just c/t to hold OFF on taking BP medication for now since readings are looking normal.   Plan f/u visit in 16mo if she starts seeing higher numbers such as >140/90.

## 2016-11-20 NOTE — Telephone Encounter (Signed)
cheri as already call he

## 2016-11-21 ENCOUNTER — Ambulatory Visit: Payer: BLUE CROSS/BLUE SHIELD | Admitting: Medical

## 2016-11-21 NOTE — Telephone Encounter (Signed)

## 2016-11-29 ENCOUNTER — Telehealth: Payer: Self-pay | Admitting: Medical

## 2016-11-29 NOTE — Telephone Encounter (Signed)
Mr Schmelzle dropped off blood pressure readings wanted you to go over them and see what you think of them, they can be reached at [phone], put readings in your folder

## 2016-12-03 NOTE — Telephone Encounter (Signed)
I reviewed all the readings and they are all consistently normal.  Is she taking BP medication current or not?  Please document her response

## 2016-12-04 NOTE — Telephone Encounter (Signed)
Per dr.lalonde she was told to stop taking her b/p . She brought her reading by her while you were on vacation

## 2016-12-04 NOTE — Telephone Encounter (Signed)
Ok, well numbers look good off medication

## 2016-12-13 ENCOUNTER — Encounter: Payer: Self-pay | Admitting: Medical

## 2016-12-13 ENCOUNTER — Other Ambulatory Visit: Payer: Self-pay | Admitting: Medical

## 2016-12-13 ENCOUNTER — Ambulatory Visit (INDEPENDENT_AMBULATORY_CARE_PROVIDER_SITE_OTHER): Payer: BLUE CROSS/BLUE SHIELD | Admitting: Medical

## 2016-12-13 VITALS — BP 124/82 | Wt 165.4 lb

## 2016-12-13 DIAGNOSIS — I10 Essential (primary) hypertension: Secondary | ICD-10-CM

## 2016-12-13 DIAGNOSIS — R635 Abnormal weight gain: Secondary | ICD-10-CM

## 2016-12-13 DIAGNOSIS — R609 Edema, unspecified: Secondary | ICD-10-CM | POA: Insufficient documentation

## 2016-12-13 NOTE — Progress Notes (Signed)
Subjective:     Patient ID: Becky Goodwin, female   DOB: 09/10/1965, 51 y.o.   MRN: [id number]  HPI Here for several concerns, accompanied by husband.  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  Edema - c/o lower leg swelling bilat worse by end of the day.  She is on her feet all day, works on her feet and by evening is swollen.  Improved by the morning  She has recent BP readings, all WNL.  Wants to know if she needs to restart BP medication  Wants routine labs today.  No other aggravating or relieving factors. No other complaint.  Past Medical History:  Diagnosis Date  . Chronic neck pain   . Hypertension   . Shoulder pain    Current Outpatient Prescriptions on File Prior to Visit  Medication Sig Dispense Refill  . cefdinir (OMNICEF) 300 MG capsule Take 1 capsule (300 mg total) by mouth 2 (two) times daily. 20 capsule 0  . lisinopril-hydrochlorothiazide (PRINZIDE,ZESTORETIC) 10-12.5 MG tablet TAKE ONE TABLET BY MOUTH ONCE DAILY 90 tablet 1   No current facility-administered medications on file prior to visit.    Review of Systems     Objective:   Physical Exam  BP 124/82   Wt 165 lb 6.4 oz (75 kg)   LMP 10/06/2012   BMI 28.61 kg/m   Wt Readings from Last 3 Encounters:  12/13/16 165 lb 6.4 oz (75 kg)  07/16/16 158 lb 9.6 oz (71.9 kg)  07/12/16 158 lb 9.6 oz (71.9 kg)   General appearance: alert, no distress, WD/WN,  Oral cavity: MMM, no lesions Neck: supple, no lymphadenopathy, no thyromegaly, no masses Heart: RRR, normal S1, S2, no murmurs Lungs: CTA bilaterally, no wheezes, rhonchi, or rales Pulses: 2+ symmetric, upper and lower extremities, normal cap refill Ext: no edema      Assessment:     Encounter Diagnoses  Name Primary?  . Essential hypertension Yes  . Weight gain   . Edema, unspecified type        Plan:     HTN - resolved at this point.  Home BP readings and out readings continue to be WNL.  Weight gain - discussed diet, exercise, and weight loss  goals  Edema - gave script for compression hose, adivsed leg elevation, encouraged routine exercise  Becky Goodwin was seen today for swelling in legs and b/p.  Diagnoses and all orders for this visit:  Essential hypertension -     Hemoglobin A1c -     TSH -     Basic metabolic panel -     Lipid panel  Weight gain -     Hemoglobin A1c -     TSH -     Basic metabolic panel -     Lipid panel  Edema, unspecified type -     Hemoglobin A1c -     TSH -     Basic metabolic panel -     Lipid panel

## 2016-12-14 LAB — HEMOGLOBIN A1C
Hgb A1c MFr Bld: 5.5 % (ref <5.7)
Mean Plasma Glucose: 111 mg/dL

## 2016-12-14 LAB — BASIC METABOLIC PANEL
BUN: 11 mg/dL (ref 7–25)
CALCIUM: 9.4 mg/dL (ref 8.6–10.4)
CHLORIDE: 106 mmol/L (ref 98–110)
CO2: 25 mmol/L (ref 20–31)
Creat: 0.7 mg/dL (ref 0.50–1.05)
Glucose, Bld: 117 mg/dL — ABNORMAL HIGH (ref 65–99)
POTASSIUM: 4.1 mmol/L (ref 3.5–5.3)
SODIUM: 141 mmol/L (ref 135–146)

## 2016-12-14 LAB — LIPID PANEL
CHOL/HDL RATIO: 5.4 ratio — AB (ref <5.0)
Cholesterol: 188 mg/dL (ref <200)
HDL: 35 mg/dL — ABNORMAL LOW (ref >50)
LDL CALC: 103 mg/dL — AB (ref <100)
Triglycerides: 248 mg/dL — ABNORMAL HIGH (ref <150)
VLDL: 50 mg/dL — AB (ref <30)

## 2016-12-14 LAB — TSH: TSH: 0.25 m[IU]/L — AB

## 2016-12-16 ENCOUNTER — Other Ambulatory Visit: Payer: Self-pay | Admitting: Medical

## 2016-12-16 DIAGNOSIS — E039 Hypothyroidism, unspecified: Secondary | ICD-10-CM

## 2016-12-17 ENCOUNTER — Other Ambulatory Visit: Payer: Self-pay | Admitting: Medical

## 2016-12-17 LAB — T4, FREE: FREE T4: 1.4 ng/dL (ref 0.8–1.8)

## 2016-12-17 LAB — THYROID PEROXIDASE ANTIBODY: Thyroperoxidase Ab SerPl-aCnc: 1 IU/mL (ref <9)

## 2016-12-17 MED ORDER — PRAVASTATIN SODIUM 20 MG PO TABS: 20.0000 mg | ORAL_TABLET | Freq: Every day | ORAL | 3 refills | Status: DC

## 2016-12-25 ENCOUNTER — Encounter: Payer: Self-pay | Admitting: Gynecology

## 2017-01-10 ENCOUNTER — Telehealth: Payer: Self-pay | Admitting: Medical

## 2017-01-10 NOTE — Telephone Encounter (Signed)
Becky Goodwin come by and brought my Becky Goodwin blood pressures numbers  5-14 115/81 5-16 121-79 5-20 115-75 5-22 115/79 5-24 108/80 5-29 107/78 05/31 101/76  States that the numbers are dropping and that she is feeling dizzy, while walking, is wanting to know what to do he can be reached at [phone] [facility] [place] [place] - [address]

## 2017-01-12 NOTE — Telephone Encounter (Signed)
Those numbers look good.  However if feeling dizzy or thinking her numbers are too low, have her come in for recheck/OV for EKG/heart .

## 2017-01-13 ENCOUNTER — Encounter: Payer: Self-pay | Admitting: Medical

## 2017-01-13 ENCOUNTER — Ambulatory Visit (INDEPENDENT_AMBULATORY_CARE_PROVIDER_SITE_OTHER): Payer: BLUE CROSS/BLUE SHIELD | Admitting: Medical

## 2017-01-13 VITALS — Wt 161.0 lb

## 2017-01-13 DIAGNOSIS — R42 Dizziness and giddiness: Secondary | ICD-10-CM | POA: Diagnosis not present

## 2017-01-13 DIAGNOSIS — E785 Hyperlipidemia, unspecified: Secondary | ICD-10-CM

## 2017-01-13 MED ORDER — MECLIZINE HCL 25 MG PO TABS: 25.0000 mg | ORAL_TABLET | Freq: Two times a day (BID) | ORAL | 0 refills | Status: DC

## 2017-01-13 NOTE — Telephone Encounter (Signed)
Pt is coming in this afternoon for a appt

## 2017-01-13 NOTE — Telephone Encounter (Signed)
Lets get an EKG

## 2017-01-13 NOTE — Telephone Encounter (Signed)
Ok

## 2017-01-13 NOTE — Progress Notes (Signed)
Subjective: Chief Complaint  Patient presents with  . Dizziness    dizziness    Here with husband today who helps translate.     She brought in recent BP readings which were all normal.  howeve she has been having episodes of dizziness.   Dizziness lasts 5minutes or so.  Gets episodes may 5- 6 times daily.  No allergy sympotms.   No headacche, no sinus pressure.    No chest pain, no SOB, no palpitaions, no recent edema.   LMP 3 years ago.  No bleeding no bruising no weith loss or nigitt sweats.  No ringing in the ears or hearing loss.  No other aggravating or relieving factors. No other complaint.  Past Medical History:  Diagnosis Date  . Chronic neck pain   . Hypertension   . Shoulder pain    Current Outpatient Prescriptions on File Prior to Visit  Medication Sig Dispense Refill  . pravastatin (PRAVACHOL) 20 MG tablet Take 1 tablet (20 mg total) by mouth at bedtime. 90 tablet 3   No current facility-administered medications on file prior to visit.    ROS as in subjective   Objective: Wt 161 lb (73 kg)   LMP 10/06/2012   SpO2 98%   BMI 27.85 kg/m   Wt Readings from Last 3 Encounters:  01/13/17 161 lb (73 kg)  12/13/16 165 lb 6.4 oz (75 kg)  07/16/16 158 lb 9.6 oz (71.9 kg)      Vitals:    General appearence: alert, no distress, WD/WN,  HEENT: normocephalic, sclerae anicteric, PERRLA, EOMi, nares patent, no discharge or erythema, pharynx normal Oral cavity: MMM, no lesions Neck: supple, no lymphadenopathy, no thyromegaly, no masses Heart: RRR, normal S1, S2, no murmurs Lungs: CTA bilaterally, no wheezes, rhonchi, or rales Abdomen: +bs, soft, non tender, non distended, no masses, no hepatomegaly, no splenomegaly Back: non tender Musculoskeletal: nontender, no swelling, no obvious deformity Extremities: no edema, no cyanosis, no clubbing Pulses: 2+ symmetric, upper and lower extremities, normal cap refill Neurological: alert, oriented x 3, CN2-12 intact, strength  normal upper extremities and lower extremities, sensation normal throughout, DTRs 2+ throughout, no cerebellar signs, gait normal Psychiatric: normal affect, behavior normal, pleasant    Adult ECG Report  Indication: dizziness  Rate: 61 bpm  Rhythm: normal sinus rhythm  QRS Axis: -4 degrees  PR Interval: 172ms  QRS Duration: 88ms  QTc: 408ms  Conduction Disturbances: none  Other Abnormalities: none  Patient's cardiac risk factors are: none.  EKG comparison: none  Narrative Interpretation: normal EKG    Assessment: Encounter Diagnoses  Name Primary?  . Dizziness Yes  . Vertigo   . Hyperlipidemia, unspecified hyperlipidemia type      Plan: Orthostatic vitals ok today.   BP WNL today.  EKG reviewed.    Begin trial of meclizine for vertigo symtposm.  If not improving in the next 2 weeks, then consider ENT consult or vesticular rehab.  Hyperlipidemia - reviewed last labs, dciscussed diet, c/t same medicaitons   Becky Goodwin was seen today for dizziness.  Diagnoses and all orders for this visit:  Dizziness -     EKG 12-Lead  Vertigo -     EKG 12-Lead  Hyperlipidemia, unspecified hyperlipidemia type  Other orders -     meclizine (ANTIVERT) 25 MG tablet; Take 1 tablet (25 mg total) by mouth 2 (two) times daily.

## 2017-03-17 ENCOUNTER — Encounter: Payer: Self-pay | Admitting: Obstetrics and Gynecology

## 2017-03-17 ENCOUNTER — Ambulatory Visit (INDEPENDENT_AMBULATORY_CARE_PROVIDER_SITE_OTHER): Payer: BLUE CROSS/BLUE SHIELD | Admitting: Obstetrics and Gynecology

## 2017-03-17 VITALS — BP 110/72 | HR 76 | Temp 98.1°F | Resp 16 | Ht 63.75 in | Wt 151.0 lb

## 2017-03-17 DIAGNOSIS — N3001 Acute cystitis with hematuria: Secondary | ICD-10-CM

## 2017-03-17 DIAGNOSIS — R350 Frequency of micturition: Secondary | ICD-10-CM | POA: Diagnosis not present

## 2017-03-17 DIAGNOSIS — N841 Polyp of cervix uteri: Secondary | ICD-10-CM | POA: Diagnosis not present

## 2017-03-17 LAB — POCT URINALYSIS DIPSTICK
Bilirubin, UA: NEGATIVE
Glucose, UA: NEGATIVE
Ketones, UA: NEGATIVE
NITRITE UA: NEGATIVE
UROBILINOGEN UA: 0.2 U/dL
pH, UA: 7 (ref 5.0–8.0)

## 2017-03-17 MED ORDER — PHENAZOPYRIDINE HCL 200 MG PO TABS: 200.0000 mg | ORAL_TABLET | Freq: Three times a day (TID) | ORAL | 0 refills | Status: DC | PRN

## 2017-03-17 MED ORDER — SULFAMETHOXAZOLE-TRIMETHOPRIM 800-160 MG PO TABS: 1.0000 | ORAL_TABLET | Freq: Two times a day (BID) | ORAL | 0 refills | Status: DC

## 2017-03-17 NOTE — Patient Instructions (Signed)

## 2017-03-17 NOTE — Addendum Note (Signed)
Addended by: Ardell IsaacsAMUNDSON C SILVA, Debbe BalesBROOK E on: 03/17/2017 03:47 PM   Modules accepted: Orders

## 2017-03-17 NOTE — Progress Notes (Signed)
GYNECOLOGY  VISIT   HPI: 51 y.o.   Married  Asian  female   [phone] with Patient's last menstrual period was 10/06/2012.   here for vaginal itching/swelling. Frequency, Burning and pain with urination. Abdominal pain. Started Friday 03/13/17.  Interpretor is here today.   No blood in urine.  Having back pain.  No fevers.  Does feel hot and cold.  No nausea or vomiting.   Had E Coli UTI in June 2017.  Treated with Ciprofloxacin.   Urine: 3+ RBC, trace protein, 7.0 pH, 1+ WBC  GYNECOLOGIC HISTORY: Patient's last menstrual period was 10/06/2012. Contraception:  Postmenopausal Menopausal hormone therapy:  none Last mammogram:  04/30/16 BIRADS 1 negative/density c Last pap smear:    12/22/13 WNL NEG HR HPV        OB History    Gravida Para Term Preterm AB Living   3 3 3     3    SAB TAB Ectopic Multiple Live Births           3         Patient Active Problem List   Diagnosis Date Noted  . Dizziness 01/13/2017  . Hyperlipidemia 01/13/2017  . Hyperlipidemia 01/13/2017  . Weight gain 12/13/2016  . Edema 12/13/2016  . Pelvic pain in female 10/27/2015  . Chronic right shoulder pain 10/27/2015  . Encounter for health maintenance examination in adult 10/27/2015  . Special screening for malignant neoplasms, colon 10/27/2015  . Screening for breast cancer 10/27/2015    Past Medical History:  Diagnosis Date  . Chronic neck pain   . Hypertension   . Shoulder pain     Past Surgical History:  Procedure Laterality Date  . BREAST LUMPECTOMY    . CYST EXCISION     upper chest/ breast ([facility]), 1 week hospitalization  . JEJUNOSTOMY FEEDING TUBE     prior with surgery  . TONSILLECTOMY     Right tonsillectomy, 2wk hospitalization ([facility])    Current Outpatient Prescriptions  Medication Sig Dispense Refill  . meclizine (ANTIVERT) 25 MG tablet Take 1 tablet (25 mg total) by mouth 2 (two) times daily. 30 tablet 0  . pravastatin (PRAVACHOL) 20 MG tablet Take 1 tablet (20 mg total)  by mouth at bedtime. 90 tablet 3   No current facility-administered medications for this visit.      ALLERGIES: Hydrocodone  Family History  Problem Relation Age of Onset  . Hypertension Father   . Cancer Neg Hx   . Diabetes Neg Hx   . Heart disease Neg Hx   . Stroke Neg Hx     Social History   Social History  . Marital status: Married    Spouse name: N/A  . Number of children: N/A  . Years of education: N/A   Occupational History  . Not on file.   Social History Main Topics  . Smoking status: Never Smoker  . Smokeless tobacco: Never Used  . Alcohol use No  . Drug use: No  . Sexual activity: Not Currently    Partners: Male    Birth control/ protection: None   Other Topics Concern  . Not on file   Social History Narrative   Lives with husband, 3 children, ages 51yo, 51yo, 625yo, works in Designer, fashion/clothingtextiles, various jobs, some exercise with walking    ROS:  Pertinent items are noted in HPI.  PHYSICAL EXAMINATION:    BP 110/72 (BP Location: Right Arm, Patient Position: Sitting, Cuff Size: Normal)   Pulse 76  Temp 98.1 F (36.7 C) (Oral)   Resp 16   Ht 5' 3.75" (1.619 m)   Wt 151 lb (68.5 kg)   LMP 10/06/2012   BMI 26.12 kg/m     General appearance: alert, cooperative and appears stated age Head: Normocephalic, without obvious abnormality, atraumatic   Lungs: clear to auscultation bilaterally Heart: regular rate and rhythm Abdomen: soft, non-tender, no masses,  no organomegaly Back:  No CVA tenderness.  Pelvic: External genitalia:  no lesions              Urethra:  normal appearing urethra with no masses, tenderness or lesions              Bartholins and Skenes: normal                 Vagina: normal appearing vagina with normal color and discharge, no lesions              Cervix: 6 - 7 mm exophytic polyp removed with verbal permission and sent to pathology.  AgNO3 used after for hemostasis.                Bimanual Exam:  Uterus:  normal size, contour,  position, consistency, mobility, non-tender              Adnexa: no mass, fullness, tenderness            Chaperone was present for exam.  ASSESSMENT  UTI.  Cervical polyp.  PLAN  Bactrim DS po bid x 3 days.  Pyridium 200 mg po tid prn.  Urine micro and cx.  Hydrate well.  Follow up cervical polyp pathology.  Nothing per vagina for 2 -3 days. Return in about 6 - 7 weeks for annual exam.   An After Visit Summary was printed and given to the patient.  ___15___ minutes face to face time of which over 50% was spent in counseling.

## 2017-03-18 LAB — URINALYSIS, MICROSCOPIC ONLY: Casts: NONE SEEN /lpf

## 2017-03-20 LAB — URINE CULTURE

## 2017-03-21 ENCOUNTER — Telehealth: Payer: Self-pay | Admitting: *Deleted

## 2017-03-21 NOTE — Telephone Encounter (Signed)
-----   Message from Patton SallesBrook E Amundson C Silva, MD sent at 03/20/2017  5:59 PM EDT ----- Please inform patient that her UC showed E Coli and that the Bactrim DS should treat the infection.  I hope she is feeling better.   The cervical polyp I removed is benign.   You may need an interpretor to assist with the phone call.  Check the DPI to see if there is a family member who can also receive information.

## 2017-03-21 NOTE — Telephone Encounter (Signed)
Left message to call Vitalia Stough at [phone].  

## 2017-03-26 NOTE — Telephone Encounter (Signed)
Spouse "Chhatra", ok per current dpr,  in office to get copy of lab results and schedule AEX.   Verbal request for ROI completed.  Patient scheduled for AEX with Dr. Edward JollySilva on 05/09/17 at 2:30pm.   Routing to provider for final review. Patient is agreeable to disposition. Will close encounter.     Notes recorded by Leda MinHamm, Lunden Mcleish N, RN on 03/26/2017 at 10:29 AM EDT Call to patient using [facility] interpretor -Nepali- [phone]. Spoke with patients husband, 'Chhatra", ok per current dpr. Advised as seen below per Dr. Edward JollySilva. Reports patient has completed antibiotic and is feeling better. Advised husband to return call to office with any additional questions/concerns.

## 2017-03-26 NOTE — Telephone Encounter (Signed)
Patient's spouse, DPR on file to share PHI, stopped by the office requesting to get the results in person.

## 2017-04-16 ENCOUNTER — Other Ambulatory Visit: Payer: Self-pay | Admitting: Medical

## 2017-04-16 DIAGNOSIS — Z1231 Encounter for screening mammogram for malignant neoplasm of breast: Secondary | ICD-10-CM

## 2017-05-01 ENCOUNTER — Ambulatory Visit: Payer: BLUE CROSS/BLUE SHIELD

## 2017-05-09 ENCOUNTER — Ambulatory Visit
Admission: RE | Admit: 2017-05-09 | Discharge: 2017-05-09 | Disposition: A | Discharge status: Home / Self Care | Payer: BLUE CROSS/BLUE SHIELD | Source: Ambulatory Visit | Attending: Medical | Admitting: Medical

## 2017-05-09 ENCOUNTER — Ambulatory Visit (INDEPENDENT_AMBULATORY_CARE_PROVIDER_SITE_OTHER): Payer: BLUE CROSS/BLUE SHIELD | Admitting: Obstetrics and Gynecology

## 2017-05-09 ENCOUNTER — Encounter: Payer: Self-pay | Admitting: Obstetrics and Gynecology

## 2017-05-09 ENCOUNTER — Ambulatory Visit (INDEPENDENT_AMBULATORY_CARE_PROVIDER_SITE_OTHER): Payer: BLUE CROSS/BLUE SHIELD | Admitting: Family Medicine

## 2017-05-09 ENCOUNTER — Other Ambulatory Visit (HOSPITAL_COMMUNITY)
Admission: RE | Admit: 2017-05-09 | Discharge: 2017-05-09 | Disposition: A | Discharge status: Home / Self Care | Payer: BLUE CROSS/BLUE SHIELD | Source: Ambulatory Visit | Attending: Obstetrics and Gynecology | Admitting: Obstetrics and Gynecology

## 2017-05-09 ENCOUNTER — Encounter: Payer: Self-pay | Admitting: Family Medicine

## 2017-05-09 VITALS — BP 104/64 | HR 66 | Resp 20 | Ht 63.75 in | Wt 149.0 lb

## 2017-05-09 VITALS — BP 120/70 | HR 60 | Wt 148.0 lb

## 2017-05-09 DIAGNOSIS — Z1231 Encounter for screening mammogram for malignant neoplasm of breast: Secondary | ICD-10-CM | POA: Diagnosis not present

## 2017-05-09 DIAGNOSIS — Z79899 Other long term (current) drug therapy: Secondary | ICD-10-CM | POA: Diagnosis not present

## 2017-05-09 DIAGNOSIS — N760 Acute vaginitis: Secondary | ICD-10-CM

## 2017-05-09 DIAGNOSIS — Z01419 Encounter for gynecological examination (general) (routine) without abnormal findings: Secondary | ICD-10-CM | POA: Diagnosis not present

## 2017-05-09 DIAGNOSIS — E785 Hyperlipidemia, unspecified: Secondary | ICD-10-CM | POA: Diagnosis not present

## 2017-05-09 DIAGNOSIS — Z1211 Encounter for screening for malignant neoplasm of colon: Secondary | ICD-10-CM | POA: Diagnosis not present

## 2017-05-09 LAB — LIPID PANEL
CHOL/HDL RATIO: 4.7 (calc) (ref <5.0)
CHOLESTEROL: 180 mg/dL (ref <200)
HDL: 38 mg/dL — AB (ref >50)
LDL Cholesterol (Calc): 116 mg/dL (calc) — ABNORMAL HIGH
Non-HDL Cholesterol (Calc): 142 mg/dL (calc) — ABNORMAL HIGH (ref <130)
TRIGLYCERIDES: 143 mg/dL (ref <150)

## 2017-05-09 MED ORDER — PRAVASTATIN SODIUM 20 MG PO TABS: 20.0000 mg | ORAL_TABLET | Freq: Every day | ORAL | 0 refills | Status: DC

## 2017-05-09 MED ORDER — IBUPROFEN 600 MG PO TABS: 600.0000 mg | ORAL_TABLET | Freq: Four times a day (QID) | ORAL | 0 refills | Status: DC | PRN

## 2017-05-09 NOTE — Patient Instructions (Addendum)
If you are not getting adequate calcium in your diet or other nutrients, you may want to start taking a Multi-vitamin. Women's one a day is fine.   Follow up with Kristian Covey, PA in 3 months or sooner if needed.

## 2017-05-09 NOTE — Progress Notes (Signed)
51 y.o. G6P3003 Married [place] female here for annual exam.    Husband present to interpret today.  Declines other interpretor.   Becky Goodwin complains of occasional vulvar itching.  Treated for an E Coli UTI in August.   Also having right hand and shoulder pain.  Asking me to write a note so she does not have to work today and tomorrow due to right foot pain that she has for 7 years.   PCP:  Crosby Oyster, PA-C   Becky Goodwin's last menstrual period was 10/06/2012.           Sexually active: No. female The current method of family planning is post menopausal status.    Exercising: No.   Smoker:  no  Health Maintenance: Pap: 12-22-13 Neg:Neg HR HPV  History of abnormal Pap:  no MMG: 04-30-16 Density C/Neg/BiRads1:TBC.  Has appt. Today.  Colonoscopy: Never BMD:   n/a  Result  n/a TDaP:  03-14-10 Gardasil:   no Screening Labs:  Hb today: PCP, Urine today: not done   reports that she has never smoked. She has never used smokeless tobacco. She reports that she does not drink alcohol or use drugs.  Past Medical History:  Diagnosis Date  . Chronic neck pain   . Hyperlipidemia   . Hypertension   . Shoulder pain     Past Surgical History:  Procedure Laterality Date  . BREAST LUMPECTOMY Right 2010  . CYST EXCISION     upper chest/ breast ([facility]), 1 week hospitalization  . JEJUNOSTOMY FEEDING TUBE     prior with surgery  . TONSILLECTOMY     Right tonsillectomy, 2wk hospitalization ([facility])    Current Outpatient Prescriptions  Medication Sig Dispense Refill  . pravastatin (PRAVACHOL) 20 MG tablet Take 1 tablet (20 mg total) by mouth at bedtime. (Becky Goodwin not taking: Reported on 05/09/2017) 90 tablet 0   No current facility-administered medications for this visit.     Family History  Problem Relation Age of Onset  . Hypertension Father   . Cancer Neg Hx   . Diabetes Neg Hx   . Heart disease Neg Hx   . Stroke Neg Hx     ROS:  Pertinent items are noted in HPI.  Otherwise, a  comprehensive ROS was negative.  Exam:   BP 104/64 (BP Location: Right Arm, Becky Goodwin Position: Sitting, Cuff Size: Normal)   Pulse 66   Resp 20   Ht 5' 3.75" (1.619 m)   Wt 149 lb (67.6 kg)   LMP 10/06/2012   BMI 25.78 kg/m     General appearance: alert, cooperative and appears stated age Head: Normocephalic, without obvious abnormality, atraumatic Neck: no adenopathy, supple, symmetrical, trachea midline and thyroid normal to inspection and palpation Lungs: clear to auscultation bilaterally Breasts: normal appearance, no masses or tenderness, No nipple retraction or dimpling, No nipple discharge or bleeding, No axillary or supraclavicular adenopathy Heart: regular rate and rhythm Abdomen: soft, non-tender; no masses, no organomegaly Extremities: extremities normal, atraumatic, no cyanosis or edema Skin: Skin color, texture, turgor normal. No rashes or lesions Lymph nodes: Cervical, supraclavicular, and axillary nodes normal. No abnormal inguinal nodes palpated Neurologic: Grossly normal  Pelvic: External genitalia:  no lesions              Urethra:  normal appearing urethra with no masses, tenderness or lesions              Bartholins and Skenes: normal  Vagina: normal appearing vagina with normal color and discharge, no lesions              Cervix: no lesions              Pap taken: Yes.   Bimanual Exam:  Uterus:  normal size, contour, position, consistency, mobility, non-tender              Adnexa: no mass, fullness, tenderness              Rectal exam: Yes.  .  Confirms.              Anus:  normal sphincter tone, no lesions  Chaperone was present for exam.  Assessment:   Well woman visit with normal exam. Recent UTI.  Vaginitis symptoms.  Shoulder and foot pain.    Plan: Mammogram screening discussed. Recommended self breast awareness. Pap and HR HPV as above. Guidelines for Calcium, Vitamin D, regular exercise program including cardiovascular and  weight bearing exercise. Vaginitis testing from the pap smear.  Rx for Motrin 600 mg po q 6 hrs prn.  #30, RF none.  Return to PCP for persistent pain.  Colonoscopy referral to Dr. Loreta Ave. Letter written for work to cover for her visit in the office today. Follow up annually and prn.   After visit summary provided.

## 2017-05-09 NOTE — Patient Instructions (Signed)

## 2017-05-09 NOTE — Progress Notes (Signed)
   Subjective:    Patient ID: Becky Goodwin, female    DOB: March 25, 1966, 51 y.o.   MRN: [id number]  HPI Chief Complaint  Patient presents with  . follow-up    follow-up on bp, cholesterol and weight   She is a patient of Kristian Covey but today is seeing me for a follow up on history of elevated BP and cholesterol. Her husband is with her.   Apparently she started on pravastatin in May for elevated LDL. She states she took the medication for 3 months and ran out one month ago. Denies any side effects.  Requests refill.   States she has taken medication in the past for her BP but this was stopped on November 10 2016. Her BP has been in normal range.   She has been trying to lose weight. Eating 2 meals per day. She is feeling good and weight loss has been successful.   Denies fever, chills, dizziness, chest pain, palpitations, shortness of breath, abdominal pain, N/V/D, urinary symptoms, LE edema.   She is having a mammogram later today.   Reviewed allergies, medications, past medical, surgical, and social history.    Review of Systems Pertinent positives and negatives in the history of present illness.     Objective:   Physical Exam BP 120/70   Pulse 60   Wt 148 lb (67.1 kg)   LMP 10/06/2012   BMI 25.60 kg/m   Alert and in no distress. Tympanic membranes and canals are normal. Pharyngeal area is normal. Neck is supple without adenopathy or thyromegaly. Cardiac exam shows a regular sinus rhythm without murmurs or gallops. Lungs are clear to auscultation. Extremities without edema, normal pulses.       Assessment & Plan:  Hyperlipidemia, unspecified hyperlipidemia type - Plan: Lipid panel, pravastatin (PRAVACHOL) 20 MG tablet  Medication management - Plan: Lipid panel  She appears to be doing well. She is losing weight as intended.  BP is in goal range without medication. She will continue with healthy diet and lifestyle.  Will check fasting lipids and refill statin.  She may  start on MVI.  Follow up with her PCP Kristian Covey in 3 months.

## 2017-05-12 ENCOUNTER — Other Ambulatory Visit: Payer: Self-pay | Admitting: Medical

## 2017-05-12 ENCOUNTER — Encounter: Payer: Self-pay | Admitting: Internal Medicine

## 2017-05-12 DIAGNOSIS — E785 Hyperlipidemia, unspecified: Secondary | ICD-10-CM

## 2017-05-12 MED ORDER — PRAVASTATIN SODIUM 20 MG PO TABS: 20.0000 mg | ORAL_TABLET | Freq: Every day | ORAL | 0 refills | Status: DC

## 2017-05-13 LAB — CYTOLOGY - PAP
BACTERIAL VAGINITIS: NEGATIVE
Candida vaginitis: NEGATIVE
Diagnosis: NEGATIVE
HPV (WINDOPATH): NOT DETECTED
TRICH (WINDOWPATH): NEGATIVE

## 2017-05-14 ENCOUNTER — Telehealth: Payer: Self-pay | Admitting: *Deleted

## 2017-05-14 NOTE — Telephone Encounter (Signed)
Notes recorded by Leda Min, RN on 05/14/2017 at 1:37 PM EDT Left message to call Noreene Larsson at [phone]. See telephone encounter dated 05/14/17.  ------  Notes recorded by Patton Salles, MD on 05/14/2017 at 1:09 PM EDT Please inform patient of results.  You may need an interpretor or leave message with her husband if he is on her DPI.  Her pap is negative and she has negative HR HPV testing.  Her vaginitis testing is negative for infection.  She can use hydrocortisone cream 0.05% over the counter for the vulva if she has occasional itching.

## 2017-05-21 NOTE — Telephone Encounter (Signed)
Spoke with patients spouse, "chhatra", ok per current dpr. Advised as seen below per Dr. Edward Jolly. Spouse states he is unable to write down name of medication, will return call when he can. Spouse verbalizes understanding and is agreeable.   Patient is agreeable to disposition. Will close encounter.

## 2017-06-18 ENCOUNTER — Ambulatory Visit: Payer: BLUE CROSS/BLUE SHIELD | Admitting: Obstetrics and Gynecology

## 2017-06-18 ENCOUNTER — Encounter: Payer: Self-pay | Admitting: Obstetrics and Gynecology

## 2017-06-18 ENCOUNTER — Telehealth: Payer: Self-pay | Admitting: Obstetrics and Gynecology

## 2017-06-18 ENCOUNTER — Other Ambulatory Visit: Payer: Self-pay

## 2017-06-18 VITALS — BP 112/70 | HR 76 | Temp 97.9°F | Resp 16 | Ht 63.75 in | Wt 148.0 lb

## 2017-06-18 DIAGNOSIS — R1030 Lower abdominal pain, unspecified: Secondary | ICD-10-CM

## 2017-06-18 DIAGNOSIS — N8110 Cystocele, unspecified: Secondary | ICD-10-CM | POA: Diagnosis not present

## 2017-06-18 LAB — POCT URINALYSIS DIPSTICK
Bilirubin, UA: NEGATIVE
Glucose, UA: NEGATIVE
Ketones, UA: NEGATIVE
Nitrite, UA: NEGATIVE
Protein, UA: NEGATIVE
Urobilinogen, UA: 0.2 U/dL
pH, UA: 7 (ref 5.0–8.0)

## 2017-06-18 MED ORDER — SULFAMETHOXAZOLE-TRIMETHOPRIM 800-160 MG PO TABS: 1.0000 | ORAL_TABLET | Freq: Two times a day (BID) | ORAL | 0 refills | Status: DC

## 2017-06-18 MED ORDER — PHENAZOPYRIDINE HCL 200 MG PO TABS: 200.0000 mg | ORAL_TABLET | Freq: Three times a day (TID) | ORAL | 0 refills | Status: DC | PRN

## 2017-06-18 NOTE — Telephone Encounter (Signed)
Spoke with patients spouse. Scheduled for OV today at 2:45pm with Dr. Edward JollySilva. Spouse is aware will be worked into schedule. 11/8 OV cancelled. Spouse verbalizes understanding and is agreeable.   Routing to provider for final review. Patient is agreeable to disposition. Will close encounter.

## 2017-06-18 NOTE — Progress Notes (Signed)
GYNECOLOGY  VISIT   HPI: 51 y.o.   Married  Asian  female   [phone] with Patient's last menstrual period was 10/06/2012.   here for urinary frequency.  Daughter is present today for this visit and helps to interpret.   She has lower abdominal pain and in her lower back.  Pain with voiding.  Voiding often and small amounts.  She is feeling something protruding from the vagina.  Denies vaginal discharge.   Some nausea but no vomiting.  Eating ok.   Not sexually active in 6 months.   Urine dip - small WBCs and trace RBCs.  E Coli UTI 03/17/17 treated with Bactrim DS.  E Coli UTI 01/24/16 treated with Cipro.  GYNECOLOGIC HISTORY: Patient's last menstrual period was 10/06/2012. Contraception:  Postmenopausal Menopausal hormone therapy:  none Last mammogram: 05-09-17 Density B/Neg/BiRads1:TBC Last pap smear: 05-09-17 Neg:Neg HR HPV, 12-22-13 Neg:Neg HR HPV        OB History    Gravida Para Term Preterm AB Living   3 3 3     3    SAB TAB Ectopic Multiple Live Births           3         Patient Active Problem List   Diagnosis Date Noted  . Dizziness 01/13/2017  . Hyperlipidemia 01/13/2017  . Hyperlipidemia 01/13/2017  . Weight gain 12/13/2016  . Edema 12/13/2016  . Pelvic pain in female 10/27/2015  . Chronic right shoulder pain 10/27/2015  . Encounter for health maintenance examination in adult 10/27/2015  . Special screening for malignant neoplasms, colon 10/27/2015  . Screening for breast cancer 10/27/2015    Past Medical History:  Diagnosis Date  . Chronic neck pain   . Hyperlipidemia   . Hypertension   . Shoulder pain     Past Surgical History:  Procedure Laterality Date  . BREAST LUMPECTOMY Right 2010  . CYST EXCISION     upper chest/ breast ([facility]), 1 week hospitalization  . JEJUNOSTOMY FEEDING TUBE     prior with surgery  . TONSILLECTOMY     Right tonsillectomy, 2wk hospitalization ([facility])    Current Outpatient Medications  Medication Sig Dispense  Refill  . ibuprofen (ADVIL,MOTRIN) 600 MG tablet Take 1 tablet (600 mg total) by mouth every 6 (six) hours as needed. 30 tablet 0  . pravastatin (PRAVACHOL) 20 MG tablet Take 1 tablet (20 mg total) by mouth at bedtime. 90 tablet 0   No current facility-administered medications for this visit.      ALLERGIES: Hydrocodone  Family History  Problem Relation Age of Onset  . Hypertension Father   . Cancer Neg Hx   . Diabetes Neg Hx   . Heart disease Neg Hx   . Stroke Neg Hx     Social History   Socioeconomic History  . Marital status: Married    Spouse name: Not on file  . Number of children: Not on file  . Years of education: Not on file  . Highest education level: Not on file  Social Needs  . Financial resource strain: Not on file  . Food insecurity - worry: Not on file  . Food insecurity - inability: Not on file  . Transportation needs - medical: Not on file  . Transportation needs - non-medical: Not on file  Occupational History  . Not on file  Tobacco Use  . Smoking status: Never Smoker  . Smokeless tobacco: Never Used  Substance and Sexual Activity  . Alcohol use:  No    Alcohol/week: 0.0 oz  . Drug use: No  . Sexual activity: Not Currently    Partners: Male    Birth control/protection: None, Post-menopausal  Other Topics Concern  . Not on file  Social History Narrative   Lives with husband, 3 children, ages 719yo, 51yo, 51yo, works in Designer, fashion/clothingtextiles, various jobs, some exercise with walking    ROS:  Pertinent items are noted in HPI.  PHYSICAL EXAMINATION:    BP 112/70 (BP Location: Right Arm, Patient Position: Sitting, Cuff Size: Normal)   Pulse 76   Temp 97.9 F (36.6 C) (Oral)   Resp 16   Ht 5' 3.75" (1.619 m)   Wt 148 lb (67.1 kg)   LMP 10/06/2012   BMI 25.60 kg/m     General appearance: alert, cooperative and appears stated age Head: Normocephalic, without obvious abnormality, atraumatic Neck: no adenopathy, supple, symmetrical, trachea midline and  thyroid normal to inspection and palpation Lungs: clear to auscultation bilaterally Breasts: normal appearance, no masses or tenderness, No nipple retraction or dimpling, No nipple discharge or bleeding, No axillary or supraclavicular adenopathy Heart: regular rate and rhythm Abdomen: soft, non-tender, no masses,  no organomegaly Extremities: extremities normal, atraumatic, no cyanosis or edema Skin: Skin color, texture, turgor normal. No rashes or lesions Lymph nodes: Cervical, supraclavicular, and axillary nodes normal. No abnormal inguinal nodes palpated Neurologic: Grossly normal  Pelvic: External genitalia:  no lesions              Urethra:  normal appearing urethra with no masses, tenderness or lesions              Bartholins and Skenes: normal                 Vagina: normal appearing vagina with normal color and discharge, no lesions.  Almost second degree cystocele.               Bimanual Exam:  Uterus:  normal size, contour, position, consistency, mobility, non-tender              Adnexa: no mass, fullness, tenderness           Chaperone was present for exam.  ASSESSMENT  Bladder infection.  This is her second in 3 months.  Cystocele.  PLAN  Urine micro and culture.  Bactrim DS po bid for one week.  We dicussed her cystocele and options for care - observation, pessary, surgical repair.  Follow up in 2 weeks for a recheck and further discussion of pelvic organ prolapse.     An After Visit Summary was printed and given to the patient.  __15____ minutes face to face time of which over 50% was spent in counseling.

## 2017-06-18 NOTE — Patient Instructions (Signed)
Urinary Tract Infection, Adult A urinary tract infection (UTI) is an infection of any part of the urinary tract. The urinary tract includes the:  Kidneys.  Ureters.  Bladder.  Urethra.  These organs make, store, and get rid of pee (urine) in the body. Follow these instructions at home:  Take over-the-counter and prescription medicines only as told by your doctor.  If you were prescribed an antibiotic medicine, take it as told by your doctor. Do not stop taking the antibiotic even if you start to feel better.  Avoid the following drinks: ? Alcohol. ? Caffeine. ? Tea. ? Carbonated drinks.  Drink enough fluid to keep your pee clear or pale yellow.  Keep all follow-up visits as told by your doctor. This is important.  Make sure to: ? Empty your bladder often and completely. Do not to hold pee for long periods of time. ? Empty your bladder before and after sex. ? Wipe from front to back after a bowel movement if you are female. Use each tissue one time when you wipe. Contact a doctor if:  You have back pain.  You have a fever.  You feel sick to your stomach (nauseous).  You throw up (vomit).  Your symptoms do not get better after 3 days.  Your symptoms go away and then come back. Get help right away if:  You have very bad back pain.  You have very bad lower belly (abdominal) pain.  You are throwing up and cannot keep down any medicines or water. This information is not intended to replace advice given to you by your health care provider. Make sure you discuss any questions you have with your health care provider. Document Released: 01/15/2008 Document Revised: 01/04/2016 Document Reviewed: 06/19/2015 Elsevier Interactive Patient Education  2018 Elsevier Inc.   Pelvic Organ Prolapse Pelvic organ prolapse is the stretching, bulging, or dropping of pelvic organs into an abnormal position. It happens when the muscles and tissues that surround and support pelvic  structures are stretched or weak. Pelvic organ prolapse can involve:  Vagina (vaginal prolapse).  Uterus (uterine prolapse).  Bladder (cystocele).  Rectum (rectocele).  Intestines (enterocele).  When organs other than the vagina are involved, they often bulge into the vagina or protrude from the vagina, depending on how severe the prolapse is. What are the causes? Causes of this condition include:  Pregnancy, labor, and childbirth.  Long-lasting (chronic) cough.  Chronic constipation.  Obesity.  Past pelvic surgery.  Aging. During and after menopause, a decreased production of the hormone estrogen can weaken pelvic ligaments and muscles.  Consistently lifting more than 50 lb (23 kg).  Buildup of fluid in the abdomen due to certain diseases and other conditions.  What are the signs or symptoms? Symptoms of this condition include:  Loss of bladder control when you cough, sneeze, strain, and exercise (stress incontinence). This may be worse immediately following childbirth, and it may gradually improve over time.  Feeling pressure in your pelvis or vagina. This pressure may increase when you cough or when you are having a bowel movement.  A bulge that protrudes from the opening of your vagina or against your vaginal wall. If your uterus protrudes through the opening of your vagina and rubs against your clothing, you may also experience soreness, ulcers, infection, pain, and bleeding.  Increased effort to have a bowel movement or urinate.  Pain in your low back.  Pain, discomfort, or disinterest in sexual intercourse.  Repeated bladder infections (urinary tract infections).    Difficulty inserting or inability to insert a tampon or applicator.  In some people, this condition does not cause any symptoms. How is this diagnosed? Your health care provider may perform an internal and external vaginal and rectal exam. During the exam, you may be asked to cough and strain  while you are lying down, sitting, and standing up. Your health care provider will determine if other tests are required, such as bladder function tests. How is this treated? In most cases, this condition needs to be treated only if it produces symptoms. No treatment is guaranteed to correct the prolapse or relieve the symptoms completely. Treatment may include:  Lifestyle changes, such as: ? Avoiding drinking beverages that contain caffeine. ? Increasing your intake of high-fiber foods. This can help to decrease constipation and straining during bowel movements. ? Emptying your bladder at scheduled times (bladder training therapy). This can help to reduce or avoid urinary incontinence. ? Losing weight if you are overweight or obese.  Estrogen. Estrogen may help mild prolapse by increasing the strength and tone of pelvic floor muscles.  Kegel exercises. These may help mild cases of prolapse by strengthening and tightening the muscles of the pelvic floor.  Pessary insertion. A pessary is a soft, flexible device that is placed into your vagina by your health care provider to help support the vaginal walls and keep pelvic organs in place.  Surgery. This is often the only form of treatment for severe prolapse. Different types of surgeries are available.  Follow these instructions at home:  Wear a sanitary pad or absorbent product if you have urinary incontinence.  Avoid heavy lifting and straining with exercise and work. Do not hold your breath when you perform mild to moderate lifting and exercise activities. Limit your activities as directed by your health care provider.  Take medicines only as directed by your health care provider.  Perform Kegel exercises as directed by your health care provider.  If you have a pessary, take care of it as directed by your health care provider. Contact a health care provider if:  Your symptoms interfere with your daily activities or sex life.  You  need medicine to help with the discomfort.  You notice bleeding from the vagina that is not related to your period.  You have a fever.  You have pain or bleeding when you urinate.  You have bleeding when you have a bowel movement.  You lose urine when you have sex.  You have chronic constipation.  You have a pessary that falls out.  You have vaginal discharge that has a bad smell.  You have low abdominal pain or cramping that is unusual for you. This information is not intended to replace advice given to you by your health care provider. Make sure you discuss any questions you have with your health care provider. Document Released: 02/23/2014 Document Revised: 01/04/2016 Document Reviewed: 10/11/2013 Elsevier Interactive Patient Education  2018 Elsevier Inc.   

## 2017-06-18 NOTE — Telephone Encounter (Signed)
Spoke with patients spouse "Chhatra", request OV with Dr. Edward JollySilva. Reports his wife is voiding small amounts, urinary frequency and discomfort for the last 3 days.   Denies N/V, fever/chills, lower back/flank pain, blood in urine.   Scheduled for OV on 06/19/17 at 8:30am with Dr. Edward JollySilva. Spouse verbalizes understanding and is agreeable.  Routing to provider for final review. Patient is agreeable to disposition. Will close encounter.

## 2017-06-18 NOTE — Telephone Encounter (Signed)
Patient's spouse calling to schedule an appointment because patient is having stomach issues.

## 2017-06-18 NOTE — Telephone Encounter (Signed)
I am happy to see the patient sooner if she is available. Encounter closed by me by mistake.

## 2017-06-19 ENCOUNTER — Ambulatory Visit: Payer: Self-pay | Admitting: Obstetrics and Gynecology

## 2017-06-19 ENCOUNTER — Ambulatory Visit: Payer: BLUE CROSS/BLUE SHIELD | Admitting: Medical

## 2017-06-19 LAB — URINALYSIS, MICROSCOPIC ONLY: Casts: NONE SEEN /lpf

## 2017-06-19 LAB — URINE CULTURE

## 2017-06-20 ENCOUNTER — Telehealth: Payer: Self-pay | Admitting: *Deleted

## 2017-06-20 NOTE — Telephone Encounter (Signed)
Notes recorded by Leda MinHamm, Dorsie Burich N, RN on 06/20/2017 at 10:12 AM EST Left message with spouse " Chhatra", ok per dpr, to call Noreene LarssonJill at [phone]. See telephone encounter dated 06/20/17.   ------  Notes recorded by Patton SallesAmundson C Silva, Brook E, MD on 06/19/2017 at 7:37 PM EST Please report to the patient negative urine culture.  She may stop her antibiotics.  She is to return for a recheck visit in about 10 days.

## 2017-06-23 NOTE — Telephone Encounter (Signed)
Spoke with daughter, Rolin BarryRazhika, Left message to return call Noreene LarssonJill at [phone].

## 2017-06-26 NOTE — Telephone Encounter (Signed)
Spoke with patients spouse "Chhatra", ok per current dpr. Advised as seen below per Dr. Edward JollySilva. Spouse states she has 3 more pills left, will stop antibiotic. Keep f/u appt scheduled with Dr. Edward JollySilva on 06/30/17. Spouse verbalizes understanding and is agreeable. Will close encounter.

## 2017-06-30 ENCOUNTER — Ambulatory Visit: Payer: BLUE CROSS/BLUE SHIELD | Admitting: Obstetrics and Gynecology

## 2017-06-30 ENCOUNTER — Encounter: Payer: Self-pay | Admitting: Obstetrics and Gynecology

## 2017-06-30 NOTE — Progress Notes (Deleted)
GYNECOLOGY  VISIT   HPI: 51 y.o.   Married  Asian  female   [phone] with Patient's last menstrual period was 10/06/2012.   here for follow up.  GYNECOLOGIC HISTORY: Patient's last menstrual period was 10/06/2012. Contraception:  Postmenopausal Menopausal hormone therapy:  none Last mammogram:  05-09-17 Density B/Neg/BiRads1:TBC Last pap smear:  05-09-17 Neg:Neg HR HPV, 12-22-13 Neg:Neg HR HPV        OB History    Gravida Para Term Preterm AB Living   3 3 3     3    SAB TAB Ectopic Multiple Live Births           3         Patient Active Problem List   Diagnosis Date Noted  . Female bladder prolapse 06/18/2017  . Dizziness 01/13/2017  . Hyperlipidemia 01/13/2017  . Hyperlipidemia 01/13/2017  . Weight gain 12/13/2016  . Edema 12/13/2016  . Pelvic pain in female 10/27/2015  . Chronic right shoulder pain 10/27/2015  . Encounter for health maintenance examination in adult 10/27/2015  . Special screening for malignant neoplasms, colon 10/27/2015  . Screening for breast cancer 10/27/2015    Past Medical History:  Diagnosis Date  . Chronic neck pain   . Hyperlipidemia   . Hypertension   . Shoulder pain     Past Surgical History:  Procedure Laterality Date  . BREAST LUMPECTOMY Right 2010  . CYST EXCISION     upper chest/ breast ([facility]), 1 week hospitalization  . JEJUNOSTOMY FEEDING TUBE     prior with surgery  . TONSILLECTOMY     Right tonsillectomy, 2wk hospitalization ([facility])    Current Outpatient Medications  Medication Sig Dispense Refill  . ibuprofen (ADVIL,MOTRIN) 600 MG tablet Take 1 tablet (600 mg total) by mouth every 6 (six) hours as needed. 30 tablet 0  . phenazopyridine (PYRIDIUM) 200 MG tablet Take 1 tablet (200 mg total) 3 (three) times daily as needed by mouth for pain. 10 tablet 0  . pravastatin (PRAVACHOL) 20 MG tablet Take 1 tablet (20 mg total) by mouth at bedtime. 90 tablet 0  . sulfamethoxazole-trimethoprim (BACTRIM DS) 800-160 MG tablet Take 1  tablet 2 (two) times daily by mouth. Take for one week. 14 tablet 0   No current facility-administered medications for this visit.      ALLERGIES: Hydrocodone  Family History  Problem Relation Age of Onset  . Hypertension Father   . Cancer Neg Hx   . Diabetes Neg Hx   . Heart disease Neg Hx   . Stroke Neg Hx     Social History   Socioeconomic History  . Marital status: Married    Spouse name: Not on file  . Number of children: Not on file  . Years of education: Not on file  . Highest education level: Not on file  Social Needs  . Financial resource strain: Not on file  . Food insecurity - worry: Not on file  . Food insecurity - inability: Not on file  . Transportation needs - medical: Not on file  . Transportation needs - non-medical: Not on file  Occupational History  . Not on file  Tobacco Use  . Smoking status: Never Smoker  . Smokeless tobacco: Never Used  Substance and Sexual Activity  . Alcohol use: No    Alcohol/week: 0.0 oz  . Drug use: No  . Sexual activity: Not Currently    Partners: Male    Birth control/protection: None, Post-menopausal  Other Topics Concern  .  Not on file  Social History Narrative   Lives with husband, 3 children, ages 51yo, 51yo, 51yo, works in Designer, fashion/clothingtextiles, various jobs, some exercise with walking    ROS:  Pertinent items are noted in HPI.  PHYSICAL EXAMINATION:    LMP 10/06/2012     General appearance: alert, cooperative and appears stated age Head: Normocephalic, without obvious abnormality, atraumatic Neck: no adenopathy, supple, symmetrical, trachea midline and thyroid normal to inspection and palpation Lungs: clear to auscultation bilaterally Breasts: normal appearance, no masses or tenderness, No nipple retraction or dimpling, No nipple discharge or bleeding, No axillary or supraclavicular adenopathy Heart: regular rate and rhythm Abdomen: soft, non-tender, no masses,  no organomegaly Extremities: extremities normal,  atraumatic, no cyanosis or edema Skin: Skin color, texture, turgor normal. No rashes or lesions Lymph nodes: Cervical, supraclavicular, and axillary nodes normal. No abnormal inguinal nodes palpated Neurologic: Grossly normal  Pelvic: External genitalia:  no lesions              Urethra:  normal appearing urethra with no masses, tenderness or lesions              Bartholins and Skenes: normal                 Vagina: normal appearing vagina with normal color and discharge, no lesions              Cervix: no lesions                Bimanual Exam:  Uterus:  normal size, contour, position, consistency, mobility, non-tender              Adnexa: no mass, fullness, tenderness              Rectal exam: {yes no:314532}.  Confirms.              Anus:  normal sphincter tone, no lesions  Chaperone was present for exam.  ASSESSMENT     PLAN     An After Visit Summary was printed and given to the patient.  ______ minutes face to face time of which over 50% was spent in counseling.

## 2017-08-11 ENCOUNTER — Telehealth: Payer: Self-pay | Admitting: Obstetrics and Gynecology

## 2017-08-11 NOTE — Telephone Encounter (Signed)
Patient's spouse calling to schedule an appointment.

## 2017-08-11 NOTE — Telephone Encounter (Signed)
Spoke with patients spouse "Chhatra", requesting OV for "check up". Spouse request to schedule on 08/13/17. Advised Dr. Edward JollySilva is on LOA, can schedule with covering provider, spouse agreeable. Last OV with Dr. Edward JollySilva on 06/18/17, was advised to return for f/u in 10 days.   Reports no new pain or GYN concerns.   OV scheduled for 08/13/17 at 9am with Dr. Oscar LaJertson. Spouse verbalizes understanding and is agreeable.   Routing to covering provider for final review. Patient is agreeable to disposition. Will close encounter.

## 2017-08-13 ENCOUNTER — Encounter: Payer: Self-pay | Admitting: Obstetrics and Gynecology

## 2017-08-13 ENCOUNTER — Ambulatory Visit: Payer: Self-pay | Admitting: Obstetrics and Gynecology

## 2017-08-13 ENCOUNTER — Ambulatory Visit: Payer: BLUE CROSS/BLUE SHIELD | Admitting: Medical

## 2017-08-13 ENCOUNTER — Telehealth: Payer: Self-pay | Admitting: Obstetrics and Gynecology

## 2017-08-13 NOTE — Telephone Encounter (Signed)
Routing to Dr.Jertson. Appointment was scheduled by patient's spouse for follow up. Patient was last seen 06/18/2017 with Dr.Silva. No new problems or concerns. Patient cancelled appointment and declines to reschedule.  Routing to covering provider for final review. Patient is agreeable to disposition. Will close encounter.

## 2017-08-13 NOTE — Telephone Encounter (Signed)
Patient called to cancel follow up appointment for today and does not want to reschedule.

## 2017-08-21 ENCOUNTER — Telehealth: Payer: Self-pay | Admitting: Obstetrics and Gynecology

## 2017-08-21 NOTE — Telephone Encounter (Signed)
Patient's spouse calling to schedule a problem visit.

## 2017-08-21 NOTE — Telephone Encounter (Signed)
Spoke with patient's spouse, okay per ROI. Spouse is requesting to reschedule the patient's recheck for abdominal pain from 06/18/2017. States she is feeling well but wants a recheck. Can only come on 08/26/2017. Aware Dr.Silva is out of the office on Tuesdays. Wants to see another MD declines to schedule any other day. Appointment scheduled for 08/26/2017 at 3:30 pm with Dr.Jertson. 60 minutes blocked for appointment just in case this is needed.  Routing to covering provider for final review. Patient agreeable to disposition. Will close encounter.

## 2017-08-26 ENCOUNTER — Encounter: Payer: Self-pay | Admitting: Medical

## 2017-08-26 ENCOUNTER — Encounter: Payer: Self-pay | Admitting: Obstetrics and Gynecology

## 2017-08-26 ENCOUNTER — Ambulatory Visit (INDEPENDENT_AMBULATORY_CARE_PROVIDER_SITE_OTHER): Payer: BLUE CROSS/BLUE SHIELD | Admitting: Obstetrics and Gynecology

## 2017-08-26 ENCOUNTER — Ambulatory Visit: Payer: BLUE CROSS/BLUE SHIELD | Admitting: Medical

## 2017-08-26 ENCOUNTER — Other Ambulatory Visit: Payer: Self-pay

## 2017-08-26 VITALS — BP 128/80 | HR 79 | Wt 143.4 lb

## 2017-08-26 VITALS — BP 110/60 | HR 76 | Resp 16 | Wt 142.0 lb

## 2017-08-26 DIAGNOSIS — N952 Postmenopausal atrophic vaginitis: Secondary | ICD-10-CM

## 2017-08-26 DIAGNOSIS — R030 Elevated blood-pressure reading, without diagnosis of hypertension: Secondary | ICD-10-CM

## 2017-08-26 DIAGNOSIS — R7989 Other specified abnormal findings of blood chemistry: Secondary | ICD-10-CM

## 2017-08-26 DIAGNOSIS — R102 Pelvic and perineal pain: Secondary | ICD-10-CM | POA: Diagnosis not present

## 2017-08-26 DIAGNOSIS — R3989 Other symptoms and signs involving the genitourinary system: Secondary | ICD-10-CM | POA: Diagnosis not present

## 2017-08-26 DIAGNOSIS — E785 Hyperlipidemia, unspecified: Secondary | ICD-10-CM | POA: Diagnosis not present

## 2017-08-26 DIAGNOSIS — N8111 Cystocele, midline: Secondary | ICD-10-CM

## 2017-08-26 DIAGNOSIS — N760 Acute vaginitis: Secondary | ICD-10-CM | POA: Diagnosis not present

## 2017-08-26 LAB — POCT URINALYSIS DIPSTICK
Bilirubin, UA: NEGATIVE
Blood, UA: NEGATIVE
GLUCOSE UA: NEGATIVE
Ketones, UA: NEGATIVE
Nitrite, UA: NEGATIVE
PROTEIN UA: NEGATIVE
Urobilinogen, UA: NEGATIVE E.U./dL — AB
pH, UA: 7 (ref 5.0–8.0)

## 2017-08-26 MED ORDER — ESTRADIOL 0.1 MG/GM VA CREA: TOPICAL_CREAM | VAGINAL | 1 refills | Status: DC

## 2017-08-26 MED ORDER — PRAVASTATIN SODIUM 20 MG PO TABS: 20.0000 mg | ORAL_TABLET | Freq: Every day | ORAL | 3 refills | Status: DC

## 2017-08-26 NOTE — Progress Notes (Signed)
GYNECOLOGY  VISIT   HPI: 52 y.o.   Married  [place]  female   [phone] with Patient's last menstrual period was 10/06/2012.   here for follow up abdominal pain. Patient states that when she dits down quick the pain is more present. She c/o intermittent pelvic pain when she sits down quickly for the last 6 months. When she sits slowly she is okay. Sometimes the pain is daily, sometimes 1-2 x a week. The pain lasts a couple of minutes, sharp, up to a 10/10 in severity. She has a slow urinary stream, if she changes her position her flow is normal. No dysuria, frequency or urgency to void. No bowel c/o, has a BM every day.  Not sexually active.  Now with a slow, mild, cramping pain in her pelvis now. She does feel a vaginal bulge when she is sitting sometimes. No vaginal bleeding. No abnormal d/c.   The patient saw Dr Edward Jolly in 11/8 with pain and was diagnosed with a bladder infection.       GYNECOLOGIC HISTORY: Patient's last menstrual period was 10/06/2012. Contraception: Postmenopausal Menopausal hormone therapy: none        OB History    Gravida Para Term Preterm AB Living   3 3 3     3    SAB TAB Ectopic Multiple Live Births           3         Patient Active Problem List   Diagnosis Date Noted  . Female bladder prolapse 06/18/2017  . Dizziness 01/13/2017  . Hyperlipidemia 01/13/2017  . Hyperlipidemia 01/13/2017  . Weight gain 12/13/2016  . Edema 12/13/2016  . Pelvic pain in female 10/27/2015  . Chronic right shoulder pain 10/27/2015  . Encounter for health maintenance examination in adult 10/27/2015  . Special screening for malignant neoplasms, colon 10/27/2015  . Screening for breast cancer 10/27/2015    Past Medical History:  Diagnosis Date  . Chronic neck pain   . Hyperlipidemia   . Hypertension   . Shoulder pain     Past Surgical History:  Procedure Laterality Date  . BREAST LUMPECTOMY Right 2010  . CYST EXCISION     upper chest/ breast ([facility]), 1 week  hospitalization  . JEJUNOSTOMY FEEDING TUBE     prior with surgery  . TONSILLECTOMY     Right tonsillectomy, 2wk hospitalization ([facility])    No current outpatient medications on file.   No current facility-administered medications for this visit.      ALLERGIES: Hydrocodone  Family History  Problem Relation Age of Onset  . Hypertension Father   . Cancer Neg Hx   . Diabetes Neg Hx   . Heart disease Neg Hx   . Stroke Neg Hx     Social History   Socioeconomic History  . Marital status: Married    Spouse name: Not on file  . Number of children: Not on file  . Years of education: Not on file  . Highest education level: Not on file  Social Needs  . Financial resource strain: Not on file  . Food insecurity - worry: Not on file  . Food insecurity - inability: Not on file  . Transportation needs - medical: Not on file  . Transportation needs - non-medical: Not on file  Occupational History  . Not on file  Tobacco Use  . Smoking status: Never Smoker  . Smokeless tobacco: Never Used  Substance and Sexual Activity  . Alcohol use: No  Alcohol/week: 0.0 oz  . Drug use: No  . Sexual activity: Not Currently    Partners: Male    Birth control/protection: None, Post-menopausal  Other Topics Concern  . Not on file  Social History Narrative   Lives with husband, 3 children, ages 52yo, 52yo, 52yo, works in Designer, fashion/clothingtextiles, various jobs, some exercise with walking    Review of Systems  Constitutional:       Leg swelling  HENT: Negative.   Eyes: Negative.   Respiratory: Negative.   Cardiovascular: Negative.   Gastrointestinal: Negative.   Genitourinary:       Vulvar/vaginal itching Loss of urine with sneeze or cough Night urination   Musculoskeletal: Positive for joint pain and myalgias.  Skin: Negative.   Neurological: Negative.   Endo/Heme/Allergies: Negative.   Psychiatric/Behavioral: Negative.     PHYSICAL EXAMINATION:    BP 110/60 (BP Location: Right Arm, Patient  Position: Sitting, Cuff Size: Normal)   Pulse 76   Resp 16   Wt 142 lb (64.4 kg)   LMP 10/06/2012   BMI 24.57 kg/m     General appearance: alert, cooperative and appears stated age Abdomen: soft, non-tender; non distended, no masses,  no organomegaly  Pelvic: External genitalia:  no lesions              Urethra:  normal appearing urethra with no masses, tenderness or lesions              Bartholins and Skenes: normal                 Vagina: atrophic appearing vagina with erythema, no abnormal d/c, large grade 2 cystocele with valsalva              Cervix: no cervical motion tenderness and no lesions              Bimanual Exam:  Uterus:  normal size, contour, position, consistency, mobility, non-tender              Adnexa: no masses, tender on the patient's right              Bladder: tender to palpation  Pelvic floor: tender on the left only  Chaperone was present for exam.  ASSESSMENT Pelvic pain, tender in the right adnexa, right pelvic floor and bladder Cystocele, symptomatic Vaginal atrophy, increased erythema Pelvic floor tenderness on the left, if it still is bothering her after assessment and treatment of her other issues, will refer to PT    PLAN Affirm Start vaginal estrogen cream Return for a pelvic ultrasound Discussed trial of pessary, would want to treat the atrophy first Urine for ua, c&s   An After Visit Summary was printed and given to the patient.  ~30 minutes face to face time of which over 50% was spent in counseling.   Interpreter was present throughout the visit

## 2017-08-26 NOTE — Patient Instructions (Signed)

## 2017-08-26 NOTE — Progress Notes (Signed)
Subjective: Chief Complaint  Patient presents with  . Follow-up    follow up for b/p cholestrol   Here for med check.  Accompanied by husband.   We used interpreter [phone]#[id number] [facility] through [employer].     HTN - she does monitor her BPs about once monthly at [facility].   Sometimes BP high, sometimes low.  Usually sees 110/15.  Hasn't been on medication in over a year or more,maybe 2 years.  Hyperlipidemia - she finished her medication, ran out of medication 2 weeks ago.    Not exercising.  Works long hours standing, doesn't have time for exercise.  Is vegetarian.  Eats no meat, no junk food, only cooks at home.    Seeing gynecology soon for pelvic issues.   Past Medical History:  Diagnosis Date  . Chronic neck pain   . Hyperlipidemia   . Hypertension   . Shoulder pain    Current Outpatient Medications on File Prior to Visit  Medication Sig Dispense Refill  . estradiol (ESTRACE) 0.1 MG/GM vaginal cream 1 gram vaginally qhs x 1 weeks, then twice weekly at bedtime 42.5 g 1   No current facility-administered medications on file prior to visit.    ROS as in subjective   Objective: BP 128/80   Pulse 79   Wt 143 lb 6.4 oz (65 kg)   LMP 10/06/2012   SpO2 98%   BMI 24.81 kg/m   BP Readings from Last 3 Encounters:  08/26/17 128/80  06/18/17 112/70  05/09/17 104/64   Wt Readings from Last 3 Encounters:  08/26/17 143 lb 6.4 oz (65 kg)  06/18/17 148 lb (67.1 kg)  05/09/17 149 lb (67.6 kg)   General appearance: alert, no distress, WD/WN,  Neck: supple, no lymphadenopathy, no thyromegaly, no masses Heart: RRR, normal S1, S2, no murmurs Lungs: CTA bilaterally, no wheezes, rhonchi, or rales Abdomen: +bs, soft, non tender, non distended, no masses, no hepatomegaly, no splenomegaly Pulses: 2+ symmetric, upper and lower extremities, normal cap refill Ext: no edema    Assessment: Encounter Diagnoses  Name Primary?  . Hyperlipidemia, unspecified hyperlipidemia type Yes   . Transient elevated blood pressure   . Pelvic pain   . Abnormal thyroid blood test      Plan: Labs today hyperlipidemia - c/t same medication, vegetarian diet counseled on finding ways to get exercise She has been on BP medication in recent years but seems to be controled off medication at this time. Pelvic pain - f/u with gynecology  Cally was seen today for follow-up.  Diagnoses and all orders for this visit:  Hyperlipidemia, unspecified hyperlipidemia type -     Comprehensive metabolic panel -     CBC  Transient elevated blood pressure  Pelvic pain  Abnormal thyroid blood test -     TSH -     T4, free

## 2017-08-27 ENCOUNTER — Telehealth: Payer: Self-pay | Admitting: Obstetrics and Gynecology

## 2017-08-27 LAB — COMPREHENSIVE METABOLIC PANEL
ALBUMIN: 4.6 g/dL (ref 3.5–5.5)
ALT: 13 IU/L (ref 0–32)
AST: 18 IU/L (ref 0–40)
Albumin/Globulin Ratio: 1.7 (ref 1.2–2.2)
Alkaline Phosphatase: 86 IU/L (ref 39–117)
BUN / CREAT RATIO: 9 (ref 9–23)
BUN: 7 mg/dL (ref 6–24)
Bilirubin Total: 0.4 mg/dL (ref 0.0–1.2)
CO2: 24 mmol/L (ref 20–29)
Calcium: 9.7 mg/dL (ref 8.7–10.2)
Chloride: 104 mmol/L (ref 96–106)
Creatinine, Ser: 0.76 mg/dL (ref 0.57–1.00)
GFR calc non Af Amer: 91 mL/min/{1.73_m2} (ref >59)
GFR, EST AFRICAN AMERICAN: 105 mL/min/{1.73_m2} (ref >59)
GLUCOSE: 104 mg/dL — AB (ref 65–99)
Globulin, Total: 2.7 g/dL (ref 1.5–4.5)
Potassium: 5 mmol/L (ref 3.5–5.2)
Sodium: 145 mmol/L — ABNORMAL HIGH (ref 134–144)
TOTAL PROTEIN: 7.3 g/dL (ref 6.0–8.5)

## 2017-08-27 LAB — CBC
HEMATOCRIT: 40.2 % (ref 34.0–46.6)
Hemoglobin: 13.3 g/dL (ref 11.1–15.9)
MCH: 29.3 pg (ref 26.6–33.0)
MCHC: 33.1 g/dL (ref 31.5–35.7)
MCV: 89 fL (ref 79–97)
Platelets: 289 10*3/uL (ref 150–379)
RBC: 4.54 x10E6/uL (ref 3.77–5.28)
RDW: 13.9 % (ref 12.3–15.4)
WBC: 5.3 10*3/uL (ref 3.4–10.8)

## 2017-08-27 LAB — URINALYSIS, MICROSCOPIC ONLY: CASTS: NONE SEEN /LPF

## 2017-08-27 LAB — T4, FREE: Free T4: 1.33 ng/dL (ref 0.82–1.77)

## 2017-08-27 LAB — VAGINITIS/VAGINOSIS, DNA PROBE
Candida Species: NEGATIVE
Gardnerella vaginalis: NEGATIVE
TRICHOMONAS VAG: NEGATIVE

## 2017-08-27 LAB — TSH: TSH: 0.277 u[IU]/mL — ABNORMAL LOW (ref 0.450–4.500)

## 2017-08-27 NOTE — Telephone Encounter (Signed)
Call placed to patient to schedule recommended ultrasound. Left verbal message with patients spouse, to have patient to return call to our office.

## 2017-08-28 ENCOUNTER — Encounter: Payer: Self-pay | Admitting: Medical

## 2017-08-28 NOTE — Telephone Encounter (Signed)
Call transferred to me from Carmelina DaneJill Hamm, RN. Spoke with patients spouse, via interpreter to schedule recommeneed ultrasound. Spouse will check to see what days he and the patient can be off work and call back to schedule.

## 2017-08-29 LAB — URINE CULTURE

## 2017-09-01 ENCOUNTER — Telehealth: Payer: Self-pay | Admitting: *Deleted

## 2017-09-01 NOTE — Telephone Encounter (Signed)
Spoke with spouse via pacific interpreter ID # [id number]. Advised RX for estrace vaginal cream  to [facility] [place], f/u with pharmacy for filling. Reviewed instructions.   Advise patient previously scheduled for PUS on 1/29, will cancel nurse visit. Spouse declined earlier appt offered. Spouse verbalizes understanding and is agreeable.   Routing to provider for final review. Patient is agreeable to disposition. Will close encounter.

## 2017-09-01 NOTE — Telephone Encounter (Signed)
Notes recorded by Leda MinHamm, Trenton Verne N, RN on 09/01/2017 at 3:50 PM EST Spoke with patients spouse 'Chhatra", via pacific interpreter ID [phone], ok per dpr. Advised as seen below per Dr. Oscar LaJertson. Nurse visit scheduled for 09/09/17 at 3:30pm. Spouse declined earlier appointments offered. Advised patient to arrive with full bladder. Spouse states [facility]. Advised Rx for vaginal estrace cream on 08/26/17, RN will f/u with pharmacy and return call. Spouse verbalizes understanding and is agreeable. See telephone encounter dated 09/01/17. ------  Notes recorded by Romualdo BolkJertson, Annleigh Knueppel Evelyn, MD on 09/01/2017 at 12:28 PM EST Please let the patient know that her urine culture returned with a likely contaminant (needs an interpreter). She was having lots of different symptoms including a slow urinary stream at the time of her visit. Please have her come back and perform a st cath for a post void

## 2017-09-01 NOTE — Telephone Encounter (Signed)
Patients spouse returned call. Reviewed benefits for recommended ultrasound.Patient is scheduled 09/09/17 with Dr Oscar LaJertson. Patient is aware of the appointment date, arrival time and cancellation policy. Patient has not further questions.

## 2017-09-01 NOTE — Telephone Encounter (Signed)
Per review of Epic, patient is scheduled for PUS and consult on 1/29 with Dr. Oscar LaJertson. Nurse visit for 1/29 cancelled.   [facility] [place], spoke with pharmacist Sameer. Was advised no RX on file for estrace vaginal cream. Verbal order given: estradiol (Estrace) 0.1mg  /gm vaginal cream, 1 gram vaginally qhs x 1 weeks, then twice weekly at bedtime. Dispense 42.5 g/ 1 RF, dispense 90 day supply. Order read back and confirmed.

## 2017-09-03 ENCOUNTER — Telehealth: Payer: Self-pay | Admitting: Obstetrics and Gynecology

## 2017-09-03 MED ORDER — ESTROGENS, CONJUGATED 0.625 MG/GM VA CREA: TOPICAL_CREAM | VAGINAL | 1 refills | Status: DC

## 2017-09-03 NOTE — Telephone Encounter (Signed)
Spoke with patient's spouse Chhatra, okay per ROI, who states Estrace cream is too expensive. Advised alternative rx for Premarin cream 1 gram vaginally qhs x 1 week then twice weekly at bedtime has been sent to the pharmacy. May speak with the pharmacy to see if this is less expensive and return call if not. Milta DeitersChhatra is agreeable.  Routing to provider for final review.

## 2017-09-03 NOTE — Telephone Encounter (Signed)
Patient's spouse Milta DeitersChhatra is calling asking for a different prescription due to cost. DPR on file to talk with spouse.

## 2017-09-03 NOTE — Telephone Encounter (Signed)
I agree with the plan.

## 2017-09-09 ENCOUNTER — Encounter: Payer: Self-pay | Admitting: Obstetrics and Gynecology

## 2017-09-09 ENCOUNTER — Ambulatory Visit: Payer: BLUE CROSS/BLUE SHIELD

## 2017-09-09 ENCOUNTER — Ambulatory Visit: Payer: BLUE CROSS/BLUE SHIELD | Admitting: Obstetrics and Gynecology

## 2017-09-09 ENCOUNTER — Telehealth: Payer: Self-pay | Admitting: Obstetrics and Gynecology

## 2017-09-09 ENCOUNTER — Ambulatory Visit (INDEPENDENT_AMBULATORY_CARE_PROVIDER_SITE_OTHER): Payer: BLUE CROSS/BLUE SHIELD

## 2017-09-09 ENCOUNTER — Ambulatory Visit: Payer: Self-pay

## 2017-09-09 ENCOUNTER — Other Ambulatory Visit: Payer: Self-pay

## 2017-09-09 VITALS — BP 116/70 | HR 64 | Resp 14 | Wt 142.0 lb

## 2017-09-09 DIAGNOSIS — R3911 Hesitancy of micturition: Secondary | ICD-10-CM

## 2017-09-09 DIAGNOSIS — N952 Postmenopausal atrophic vaginitis: Secondary | ICD-10-CM | POA: Diagnosis not present

## 2017-09-09 DIAGNOSIS — R102 Pelvic and perineal pain: Secondary | ICD-10-CM

## 2017-09-09 DIAGNOSIS — N8111 Cystocele, midline: Secondary | ICD-10-CM

## 2017-09-09 NOTE — Progress Notes (Signed)
GYNECOLOGY  VISIT   HPI: 52 y.o.   Married  [place]  female   [phone] with Patient's last menstrual period was 10/06/2012.   here for f/u pelvic pain, vaginal atrophy, prolapse. She picked up her script for the premarin cream, didn't start it yet, didn't understand how. She is here with her husband and declines having another interpreter. As her last visit she was having vague urinary c/o, urine culture with possible contaminant. Currently without symptoms.   GYNECOLOGIC HISTORY: Patient's last menstrual period was 10/06/2012. Contraception:Postmenopausal Menopausal hormone therapy: none        OB History    Gravida Para Term Preterm AB Living   3 3 3     3    SAB TAB Ectopic Multiple Live Births           3         Patient Active Problem List   Diagnosis Date Noted  . Female bladder prolapse 06/18/2017  . Dizziness 01/13/2017  . Hyperlipidemia 01/13/2017  . Hyperlipidemia 01/13/2017  . Weight gain 12/13/2016  . Edema 12/13/2016  . Pelvic pain in female 10/27/2015  . Chronic right shoulder pain 10/27/2015  . Encounter for health maintenance examination in adult 10/27/2015  . Special screening for malignant neoplasms, colon 10/27/2015  . Screening for breast cancer 10/27/2015    Past Medical History:  Diagnosis Date  . Chronic neck pain   . Hyperlipidemia   . Hypertension   . Shoulder pain     Past Surgical History:  Procedure Laterality Date  . BREAST LUMPECTOMY Right 2010  . CYST EXCISION     upper chest/ breast ([facility]), 1 week hospitalization  . JEJUNOSTOMY FEEDING TUBE     prior with surgery  . TONSILLECTOMY     Right tonsillectomy, 2wk hospitalization ([facility])    Current Outpatient Medications  Medication Sig Dispense Refill  . conjugated estrogens (PREMARIN) vaginal cream 1 gram vaginally qhs x 1 weeks, then twice weekly at bedtime. 42.5 g 1  . pravastatin (PRAVACHOL) 20 MG tablet Take 1 tablet (20 mg total) by mouth at bedtime. 90 tablet 3   No current  facility-administered medications for this visit.      ALLERGIES: Hydrocodone  Family History  Problem Relation Age of Onset  . Hypertension Father   . Cancer Neg Hx   . Diabetes Neg Hx   . Heart disease Neg Hx   . Stroke Neg Hx     Social History   Socioeconomic History  . Marital status: Married    Spouse name: Not on file  . Number of children: Not on file  . Years of education: Not on file  . Highest education level: Not on file  Social Needs  . Financial resource strain: Not on file  . Food insecurity - worry: Not on file  . Food insecurity - inability: Not on file  . Transportation needs - medical: Not on file  . Transportation needs - non-medical: Not on file  Occupational History  . Not on file  Tobacco Use  . Smoking status: Never Smoker  . Smokeless tobacco: Never Used  Substance and Sexual Activity  . Alcohol use: No    Alcohol/week: 0.0 oz  . Drug use: No  . Sexual activity: Not Currently    Partners: Male    Birth control/protection: None, Post-menopausal  Other Topics Concern  . Not on file  Social History Narrative   Lives with husband, 3 children, ages 52yo, 52yo, 52yo, works in Designer, fashion/clothingtextiles,  various jobs, some exercise with walking    Review of Systems  Constitutional: Negative.   HENT: Negative.   Eyes: Negative.   Respiratory: Negative.   Cardiovascular: Negative.   Gastrointestinal: Negative.   Genitourinary:       Loss of sexual interest Night urination  Musculoskeletal: Negative.   Skin: Negative.   Neurological: Negative.   Endo/Heme/Allergies: Negative.   Psychiatric/Behavioral: Negative.     PHYSICAL EXAMINATION:    BP 116/70 (BP Location: Right Arm, Patient Position: Sitting, Cuff Size: Normal)   Pulse 64   Resp 14   Wt 142 lb (64.4 kg)   LMP 10/06/2012   BMI 24.57 kg/m     General appearance: alert, cooperative and appears stated age  Pelvic: External genitalia:  no lesions              Urethra:  normal appearing  urethra with no masses, tenderness or lesions              Bartholins and Skenes: normal      Grade 2 midline cystocele noted without valsalva (further exam not repeated today)              Patient was instructed in the use of the estrogen cream. 0.5 grams of premarin placed vaginally.  Chaperone was present for exam.  Discussed normal ultrasound with the patient and her husband. Normal PVR on ultrasound.   ASSESSMENT Pelvic pain, normal GYN ultrasound Vaginal atrophy, didn't understand how to use the estrogen cream Prolapse Urinary hesitancy, normal PVR on ultrasound    PLAN Instructed in use of vaginal estrogen, 1st dose placed F/U urine dip is fine, will not send culture, patient is without symptoms F/U in one month with Dr Edward Jolly for pain, possible pessary fitting She previously had some pelvic floor tenderness, if still present consider pelvic floor PT    An After Visit Summary was printed and given to the patient.

## 2017-09-09 NOTE — Telephone Encounter (Addendum)
At checkout, patient's spouse stated he will call back to schedule a 1 month recheck (due around 2/29/19) with Dr. Edward JollySilva for his wife.

## 2017-09-10 ENCOUNTER — Encounter: Payer: Self-pay | Admitting: Obstetrics and Gynecology

## 2017-10-07 NOTE — Telephone Encounter (Signed)
Left message for patient to call back and schedule a one month recheck with Dr. Edward JollySilva.

## 2017-10-20 NOTE — Telephone Encounter (Signed)
Left another message for patient to call back and schedule a one month recheck with Dr. Edward JollySilva.  Routing to Dr. Edward JollySilva for review since patient was due around 2/29/19

## 2017-10-20 NOTE — Telephone Encounter (Signed)
Patient's husband called to report his wife is doing well. He also said she is not ready to schedule an appointment at this time. He will call back to schedule at a later date.  Routing to provider for review.

## 2017-10-20 NOTE — Telephone Encounter (Signed)
Thank you for the update.  I have closed the encounter.  

## 2017-11-05 ENCOUNTER — Other Ambulatory Visit: Payer: Self-pay | Admitting: Obstetrics and Gynecology

## 2017-11-05 MED ORDER — ESTROGENS, CONJUGATED 0.625 MG/GM VA CREA: TOPICAL_CREAM | VAGINAL | 1 refills | Status: DC

## 2017-11-05 NOTE — Telephone Encounter (Signed)
Medication refill request: Premarin Cream  Last AEX:  05-09-17  Next AEX: not scheduled  Last MMG (if hormonal medication request): 05-12-17 WNL  Refill authorized: please advise

## 2017-11-05 NOTE — Telephone Encounter (Signed)
Patient's spouse came in requesting a refill for vaginal [facility].

## 2017-11-06 ENCOUNTER — Telehealth: Payer: Self-pay | Admitting: Medical

## 2017-11-06 ENCOUNTER — Telehealth: Payer: Self-pay

## 2017-11-06 NOTE — Telephone Encounter (Signed)
Pt's husband, Milta DeitersChhatra, stopped by the office with a list of questions written out for Vincenza HewsShane to answer about pt's cholesterol. Placed the list is Financial plannerhane's folder.

## 2017-11-06 NOTE — Telephone Encounter (Signed)
Spoke with Tammy from [facility] who states that the patient's husband Chhatra came into their office to discuss the patient's Premarin cream. Reports the cream is not working well for the patient and she is having ongoing vaginal itching and dryness. Offered to schedule an appointment for the patient to return to the office for evaluation and discussion. Husband declines at this time. Would like to talk with the patient and return call. Advised will send a message to the doctor as well and return call with any further recommendations.  Routing to provider for final review. Patient agreeable to disposition. Will close encounter.

## 2017-11-07 ENCOUNTER — Encounter: Payer: Self-pay | Admitting: Medical

## 2017-11-10 ENCOUNTER — Encounter: Payer: Self-pay | Admitting: Medical

## 2017-11-10 NOTE — Telephone Encounter (Signed)
pls mail letter

## 2017-11-11 NOTE — Telephone Encounter (Signed)
done

## 2017-11-14 ENCOUNTER — Encounter: Payer: Self-pay | Admitting: Medical

## 2017-11-14 ENCOUNTER — Ambulatory Visit: Payer: BLUE CROSS/BLUE SHIELD | Admitting: Medical

## 2017-11-14 VITALS — BP 118/72 | HR 76 | Ht 63.2 in | Wt 140.4 lb

## 2017-11-14 DIAGNOSIS — E785 Hyperlipidemia, unspecified: Secondary | ICD-10-CM

## 2017-11-14 DIAGNOSIS — F419 Anxiety disorder, unspecified: Secondary | ICD-10-CM | POA: Diagnosis not present

## 2017-11-14 DIAGNOSIS — F4321 Adjustment disorder with depressed mood: Secondary | ICD-10-CM | POA: Diagnosis not present

## 2017-11-14 MED ORDER — BENZONATATE 100 MG PO CAPS: 100.0000 mg | ORAL_CAPSULE | Freq: Two times a day (BID) | ORAL | 0 refills | Status: DC | PRN

## 2017-11-14 MED ORDER — RIVAROXABAN 15 MG PO TABS: 15.0000 mg | ORAL_TABLET | Freq: Two times a day (BID) | ORAL | 0 refills | Status: DC

## 2017-11-14 MED ORDER — FLUTICASONE-SALMETEROL 100-50 MCG/DOSE IN AEPB: 1.0000 | INHALATION_SPRAY | Freq: Two times a day (BID) | RESPIRATORY_TRACT | 0 refills | Status: DC

## 2017-11-14 NOTE — Progress Notes (Signed)
Subjective: Chief Complaint  Patient presents with  . Follow-up    headaches, fast heart rate, anxiety, blood levels   Here with adult daughter who interprets today.  Her mother passed away in 07/2017 unexpectedly.  They think it was stroke or heart attack.  She had gotten phone call and the person on the other end just came out with the news alarming her.  She has also been upset about husbands health.   he had what they thought was a seizure few weeks ago.   She feels scared a lot, anxious, heart beats fast.  Also curious about her BP, cholesterol   Past Medical History:  Diagnosis Date  . Chronic neck pain   . Hyperlipidemia   . Hypertension   . Shoulder pain    Current Outpatient Medications on File Prior to Visit  Medication Sig Dispense Refill  . conjugated estrogens (PREMARIN) vaginal cream 1 gram vaginally twice weekly at bedtime. 42.5 g 1  . pravastatin (PRAVACHOL) 20 MG tablet Take 1 tablet (20 mg total) by mouth at bedtime. 90 tablet 3   No current facility-administered medications on file prior to visit.    ROS as in subjective   Objective BP 118/72 (BP Location: Right Arm, Patient Position: Sitting, Cuff Size: Normal)   Pulse 76   Ht 5' 3.2" (1.605 m)   Wt 140 lb 6.4 oz (63.7 kg)   LMP 10/06/2012   SpO2 97%   BMI 24.71 kg/m   Wt Readings from Last 3 Encounters:  11/14/17 140 lb 6.4 oz (63.7 kg)  09/09/17 142 lb (64.4 kg)  08/26/17 142 lb (64.4 kg)   General appearance: alert, no distress, WD/WN,  Neck: supple, no lymphadenopathy, no thyromegaly, no masses Heart: RRR, normal S1, S2, no murmurs Lungs: CTA bilaterally, no wheezes, rhonchi, or rales Pulses: 2+ symmetric, upper and lower extremities, normal cap refill Ext: no edema     Assessment: Encounter Diagnoses  Name Primary?  . Grief Yes  . Anxiety   . Hyperlipidemia, unspecified hyperlipidemia type      Plan: Grief, anxiety - discussed concerns, discussed ways to cope with anxiety.   Reassured.   Gave her several strategies to deal with anxiety.   recommended she consider counseling.   Hyperlipidemia - when we started statin in 2014 she was having BP issues.  We will reasess.  She will stop statin, return in 35mo for fasting lipid panel.  Caitlinn was seen today for follow-up.  Diagnoses and all orders for this visit:  Grief  Anxiety  Hyperlipidemia, unspecified hyperlipidemia type -     Lipid panel; Future  Other orders -     Discontinue: Rivaroxaban (XARELTO) 15 MG TABS tablet; Take 1 tablet (15 mg total) by mouth 2 (two) times daily with a meal. -     Discontinue: benzonatate (TESSALON) 100 MG capsule; Take 1 capsule (100 mg total) by mouth 2 (two) times daily as needed for cough. -     Discontinue: Fluticasone-Salmeterol (ADVAIR DISKUS) 100-50 MCG/DOSE AEPB; Inhale 1 puff into the lungs 2 (two) times daily.

## 2017-11-25 NOTE — Progress Notes (Signed)
mm

## 2017-12-02 ENCOUNTER — Ambulatory Visit: Payer: BLUE CROSS/BLUE SHIELD | Admitting: Family Medicine

## 2017-12-05 ENCOUNTER — Other Ambulatory Visit: Payer: Self-pay

## 2017-12-05 ENCOUNTER — Ambulatory Visit: Payer: BLUE CROSS/BLUE SHIELD | Admitting: Obstetrics and Gynecology

## 2017-12-05 ENCOUNTER — Encounter: Payer: Self-pay | Admitting: Obstetrics and Gynecology

## 2017-12-05 VITALS — BP 112/60 | HR 60 | Resp 14 | Wt 139.0 lb

## 2017-12-05 DIAGNOSIS — N814 Uterovaginal prolapse, unspecified: Secondary | ICD-10-CM

## 2017-12-05 DIAGNOSIS — R102 Pelvic and perineal pain: Secondary | ICD-10-CM

## 2017-12-05 DIAGNOSIS — N8111 Cystocele, midline: Secondary | ICD-10-CM

## 2017-12-05 DIAGNOSIS — N952 Postmenopausal atrophic vaginitis: Secondary | ICD-10-CM

## 2017-12-05 DIAGNOSIS — N760 Acute vaginitis: Secondary | ICD-10-CM | POA: Diagnosis not present

## 2017-12-05 MED ORDER — BETAMETHASONE VALERATE 0.1 % EX OINT: TOPICAL_OINTMENT | CUTANEOUS | 0 refills | Status: DC

## 2017-12-05 NOTE — Progress Notes (Signed)
GYNECOLOGY  VISIT   HPI: 52 y.o.   Married  Asian  female   [phone] with Patient's last menstrual period was 10/06/2012.   here c/o vaginal irritation and itching. The itching has been present for 2 weeks, some increase in vaginal d/c, thin. No recent antibiotics.  She is using the vaginal estrogen cream, doesn't feel it is helping. The patient has a known cystocele, it is bothering her.     She has chronic lower abdominal, vaginal, rectal pain. Normal pelvic ultrasound.  She doesn't have urinary urgency or dysuria, but has a slow stream and trouble emptying her bladder.  GYNECOLOGIC HISTORY: Patient's last menstrual period was 10/06/2012. Contraception:postmenopause Menopausal hormone therapy: none        OB History    Gravida  3   Para  3   Term  3   Preterm      AB      Living  3     SAB      TAB      Ectopic      Multiple      Live Births  3              Patient Active Problem List   Diagnosis Date Noted  . Female bladder prolapse 06/18/2017  . Dizziness 01/13/2017  . Hyperlipidemia 01/13/2017  . Weight gain 12/13/2016  . Edema 12/13/2016  . Pelvic pain in female 10/27/2015  . Chronic right shoulder pain 10/27/2015  . Encounter for health maintenance examination in adult 10/27/2015  . Special screening for malignant neoplasms, colon 10/27/2015  . Screening for breast cancer 10/27/2015    Past Medical History:  Diagnosis Date  . Chronic neck pain   . Hyperlipidemia   . Hypertension   . Shoulder pain     Past Surgical History:  Procedure Laterality Date  . BREAST LUMPECTOMY Right 2010  . CYST EXCISION     upper chest/ breast ([facility]), 1 week hospitalization  . JEJUNOSTOMY FEEDING TUBE     prior with surgery  . TONSILLECTOMY     Right tonsillectomy, 2wk hospitalization ([facility])    Current Outpatient Medications  Medication Sig Dispense Refill  . conjugated estrogens (PREMARIN) vaginal cream 1 gram vaginally twice weekly at bedtime. 42.5 g  1  . pravastatin (PRAVACHOL) 20 MG tablet Take 1 tablet (20 mg total) by mouth at bedtime. 90 tablet 3   No current facility-administered medications for this visit.      ALLERGIES: Hydrocodone  Family History  Problem Relation Age of Onset  . Hypertension Father   . Cancer Neg Hx   . Diabetes Neg Hx   . Heart disease Neg Hx   . Stroke Neg Hx     Social History   Socioeconomic History  . Marital status: Married    Spouse name: Not on file  . Number of children: Not on file  . Years of education: Not on file  . Highest education level: Not on file  Occupational History  . Not on file  Social Needs  . Financial resource strain: Not on file  . Food insecurity:    Worry: Not on file    Inability: Not on file  . Transportation needs:    Medical: Not on file    Non-medical: Not on file  Tobacco Use  . Smoking status: Never Smoker  . Smokeless tobacco: Never Used  Substance and Sexual Activity  . Alcohol use: No    Alcohol/week: 0.0 oz  .  Drug use: No  . Sexual activity: Not Currently    Partners: Male    Birth control/protection: None, Post-menopausal  Lifestyle  . Physical activity:    Days per week: Not on file    Minutes per session: Not on file  . Stress: Not on file  Relationships  . Social connections:    Talks on phone: Not on file    Gets together: Not on file    Attends religious service: Not on file    Active member of club or organization: Not on file    Attends meetings of clubs or organizations: Not on file    Relationship status: Not on file  . Intimate partner violence:    Fear of current or ex partner: Not on file    Emotionally abused: Not on file    Physically abused: Not on file    Forced sexual activity: Not on file  Other Topics Concern  . Not on file  Social History Narrative   Lives with husband, 3 children, ages 61yo, 28yo, 58yo, works in Designer, fashion/clothing, various jobs, some exercise with walking    Review of Systems  Constitutional:  Negative.   HENT: Negative.   Eyes: Negative.   Respiratory: Negative.   Cardiovascular: Negative.   Gastrointestinal: Negative.   Genitourinary:       Vaginal irritation and itching  Vaginal pain  Musculoskeletal: Negative.   Skin: Negative.   Neurological: Negative.   Endo/Heme/Allergies: Negative.   Psychiatric/Behavioral: Negative.     PHYSICAL EXAMINATION:    BP 112/60 (BP Location: Right Arm, Patient Position: Sitting, Cuff Size: Normal)   Pulse 60   Resp 14   Wt 139 lb (63 kg)   LMP 10/06/2012   BMI 24.47 kg/m     General appearance: alert, cooperative and appears stated age Abdomen: soft, mildly tender in the SP region; non distended, no masses,  no organomegaly  Pelvic: External genitalia:  no lesions              Urethra:  normal appearing urethra with no masses, tenderness or lesions              Bartholins and Skenes: normal                 Vagina: atrophic appearing vagina with normal color and discharge, no lesions. With valsalva she has a grade 2-3 cystocele and grade 2 uterine prolapse, no significant rectocele              Cervix: no lesions              Bimanual Exam:  Uterus:  retroverted and retroverted, mobile, normal sized, +/- tender.              Adnexa: no mass, fullness, tenderness              Bladder: tender to palpation  Chaperone was present for exam.  Wet prep: no clue, no trich, few wbc KOH: no yeast PH: 5.5   ASSESSMENT Vulvovaginitis, negative slides Vaginal atrophy Prolapse, not voiding well Pelvic pain, normal u/s, negative prior urine culture, tender bladder, mildly tender uterus Urinary hesitancy, prior normal PVR on U/S    PLAN Affirm Treat externally with steroid ointment Increase estrogen cream to 3 x in the next week (discussed how to use it). Asked her to bring her estrogen cream with her to her next visit Return next week for pessary fitting   An After Visit Summary was printed and  given to the patient.

## 2017-12-06 LAB — VAGINITIS/VAGINOSIS, DNA PROBE
CANDIDA SPECIES: NEGATIVE
GARDNERELLA VAGINALIS: NEGATIVE
TRICHOMONAS VAG: NEGATIVE

## 2017-12-08 ENCOUNTER — Telehealth: Payer: Self-pay | Admitting: Obstetrics and Gynecology

## 2017-12-08 ENCOUNTER — Encounter: Payer: Self-pay | Admitting: Obstetrics and Gynecology

## 2017-12-08 NOTE — Telephone Encounter (Signed)
Return call to Elaine. °

## 2017-12-09 ENCOUNTER — Telehealth: Payer: Self-pay | Admitting: *Deleted

## 2017-12-09 NOTE — Telephone Encounter (Signed)
Spoke with patients spouse Chhatra, ok per dpr. Advised of normal results per Dr. Oscar La. Reminded spouse of upcoming appt on 5/10 at 3pm, patient needs to bring vaginal estrogen cream to appt. Spouse verbalizes understanding. Will close encounter.       Notes recorded by Lorri Frederick, LPN on 1/61/0960 at 10:02 AM EDT Used interpreter line- Patient was called- VM not set up. -eh ------  Notes recorded by Romualdo Bolk, MD on 12/06/2017 at 3:42 PM EDT Please advise the patient of normal results.

## 2017-12-09 NOTE — Telephone Encounter (Signed)
Returned patient call- VM full- see results note -eh

## 2017-12-09 NOTE — Telephone Encounter (Signed)
Opened in error, will close encounter

## 2017-12-16 ENCOUNTER — Ambulatory Visit: Payer: BLUE CROSS/BLUE SHIELD | Admitting: Obstetrics and Gynecology

## 2017-12-19 ENCOUNTER — Encounter: Payer: Self-pay | Admitting: Obstetrics and Gynecology

## 2017-12-19 ENCOUNTER — Telehealth: Payer: Self-pay | Admitting: Obstetrics and Gynecology

## 2017-12-19 ENCOUNTER — Other Ambulatory Visit: Payer: Self-pay

## 2017-12-19 ENCOUNTER — Ambulatory Visit: Payer: BLUE CROSS/BLUE SHIELD | Admitting: Obstetrics and Gynecology

## 2017-12-19 VITALS — BP 132/80 | HR 64 | Resp 16 | Wt 138.0 lb

## 2017-12-19 DIAGNOSIS — N8111 Cystocele, midline: Secondary | ICD-10-CM | POA: Diagnosis not present

## 2017-12-19 DIAGNOSIS — N814 Uterovaginal prolapse, unspecified: Secondary | ICD-10-CM | POA: Diagnosis not present

## 2017-12-19 NOTE — Telephone Encounter (Signed)
Patient did not want to schedule the pessary insert at this time. Patient said husband does not get off of work until after 2pm and would need a later appointment. Will figure out schedule and call on Monday to schedule insertion. Insertion to be scheduled after 01/01/18 per Dr. Oscar La.

## 2017-12-19 NOTE — Progress Notes (Signed)
GYNECOLOGY  VISIT   HPI: 52 y.o.   Married  [place]  female   [phone] with Patient's last menstrual period was 10/06/2012.   here for pessary fitting. She has a cystocele, uterine prolapse and a slow urinary stream. She has lots of questions.  She is here with an interpreter and family member.   GYNECOLOGIC HISTORY: Patient's last menstrual period was 10/06/2012. Contraception:postmenopause  Menopausal hormone therapy: none         OB History    Gravida  3   Para  3   Term  3   Preterm      AB      Living  3     SAB      TAB      Ectopic      Multiple      Live Births  3              Patient Active Problem List   Diagnosis Date Noted  . Female bladder prolapse 06/18/2017  . Dizziness 01/13/2017  . Hyperlipidemia 01/13/2017  . Weight gain 12/13/2016  . Edema 12/13/2016  . Pelvic pain in female 10/27/2015  . Chronic right shoulder pain 10/27/2015  . Encounter for health maintenance examination in adult 10/27/2015  . Special screening for malignant neoplasms, colon 10/27/2015  . Screening for breast cancer 10/27/2015    Past Medical History:  Diagnosis Date  . Chronic neck pain   . Hyperlipidemia   . Hypertension   . Shoulder pain     Past Surgical History:  Procedure Laterality Date  . BREAST LUMPECTOMY Right 2010  . CYST EXCISION     upper chest/ breast ([facility]), 1 week hospitalization  . JEJUNOSTOMY FEEDING TUBE     prior with surgery  . TONSILLECTOMY     Right tonsillectomy, 2wk hospitalization ([facility])    Current Outpatient Medications  Medication Sig Dispense Refill  . betamethasone valerate ointment (VALISONE) 0.1 % Apply a pea sized amount topically BID for the next 1-2 weeks 15 g 0  . conjugated estrogens (PREMARIN) vaginal cream 1 gram vaginally twice weekly at bedtime. 42.5 g 1   No current facility-administered medications for this visit.      ALLERGIES: Hydrocodone  Family History  Problem Relation Age of Onset  .  Hypertension Father   . Cancer Neg Hx   . Diabetes Neg Hx   . Heart disease Neg Hx   . Stroke Neg Hx     Social History   Socioeconomic History  . Marital status: Married    Spouse name: Not on file  . Number of children: Not on file  . Years of education: Not on file  . Highest education level: Not on file  Occupational History  . Not on file  Social Needs  . Financial resource strain: Not on file  . Food insecurity:    Worry: Not on file    Inability: Not on file  . Transportation needs:    Medical: Not on file    Non-medical: Not on file  Tobacco Use  . Smoking status: Never Smoker  . Smokeless tobacco: Never Used  Substance and Sexual Activity  . Alcohol use: No    Alcohol/week: 0.0 oz  . Drug use: No  . Sexual activity: Not Currently    Partners: Male    Birth control/protection: None, Post-menopausal  Lifestyle  . Physical activity:    Days per week: Not on file    Minutes per session: Not  on file  . Stress: Not on file  Relationships  . Social connections:    Talks on phone: Not on file    Gets together: Not on file    Attends religious service: Not on file    Active member of club or organization: Not on file    Attends meetings of clubs or organizations: Not on file    Relationship status: Not on file  . Intimate partner violence:    Fear of current or ex partner: Not on file    Emotionally abused: Not on file    Physically abused: Not on file    Forced sexual activity: Not on file  Other Topics Concern  . Not on file  Social History Narrative   Lives with husband, 3 children, ages 29yo, 7yo, 48yo, works in Designer, fashion/clothing, various jobs, some exercise with walking    Review of Systems  Constitutional: Negative.   HENT: Negative.   Eyes: Negative.   Respiratory: Negative.   Cardiovascular: Negative.   Gastrointestinal: Negative.   Genitourinary:       Vaginal prolapse   Musculoskeletal: Negative.   Skin: Negative.   Neurological: Negative.    Endo/Heme/Allergies: Negative.   Psychiatric/Behavioral: Negative.     PHYSICAL EXAMINATION:    BP 132/80 (BP Location: Right Arm, Patient Position: Sitting, Cuff Size: Normal)   Pulse 64   Resp 16   Wt 138 lb (62.6 kg)   LMP 10/06/2012   BMI 24.29 kg/m     General appearance: alert, cooperative and appears stated age  Pelvic: External genitalia:  no lesions              Urethra:  normal appearing urethra with no masses, tenderness or lesions              Bartholins and Skenes: normal                 Cervix: no cervical motion tenderness   Fitted with a #3 ring pessary with support, seemed to fit well, uncomfortable for the patient. Fitted with a #2 pessary with support.                Chaperone was present for exam.  ASSESSMENT Cystocele and uterine prolapse, fitted with a #2 ring pessary with support Vaginal atrophy    PLAN Discussed prolapse and pessaries, reviewed A[employer] handout Return for placement of a #2 pessary (will order) Reviewed use of vaginal estrogen   An After Visit Summary was printed and given to the patient.  In addition to the pessary fitting, ~15 minutes face to face time of counseling.

## 2017-12-22 ENCOUNTER — Telehealth: Payer: Self-pay | Admitting: Obstetrics and Gynecology

## 2017-12-22 NOTE — Telephone Encounter (Signed)
Call placed to patients spouse, "Chhatra", ok per dpr, using [employer] interpreter [phone].   1. Advised spouse pessary is in office. Will keep appointment as scheduled for pessary insertion on 01/08/18.  2. Advised spouse can continue to provide "return to work" letter to patient when seen in office, to include next appointment date with provider. Spouse states his wife can not miss any more days of work, will loose job. Offered assistance with scheduling future appointments to accommodate work schedule.  No additional letters provided.   Length of call 15 min  Routing to provider for final review. Patient is agreeable to disposition. Will close encounter.

## 2017-12-22 NOTE — Telephone Encounter (Signed)
Patient's spouse, Engineer, structural (okay per DPR), dropped off FMLA forms that he is requesting the doctor complete for the patient. Routing to [place] for review. Forms to [place].  Please see closed telephone encounter from earlier today, 12/22/17.   Cc: Dr. Oscar La, Thomasene Lot

## 2017-12-22 NOTE — Telephone Encounter (Addendum)
1. Patient's spouse, Sherel Fennell (okay per DPR on file), came in and scheduled a pessary insertion 01/08/18 at 4:00 PM with Dr. Oscar La. Patient's spouse requested the 4:00 PM slot because his wife is "worried about getting fired."   2. Patient's spouse requested a letter from Dr. Oscar La stating the patient will need excused from work in the future "for several appointments to protect her job." He said he is worried about his wife's job.

## 2017-12-23 NOTE — Telephone Encounter (Signed)
FMLA request reviewed with Dr Oscar La.  Call to patient thru [facility] interpreter service. Subhash ID [id number]. Advised husband, Milta Deiters, unable to complete FMLA forms for current condition.  Husband request reason. Explained medical condition does not justify FMLA. She can have note for office visit when here. He asked if condition is "serious"? Advised pessary should resolve issue and "not serious."

## 2018-01-08 ENCOUNTER — Ambulatory Visit: Payer: BLUE CROSS/BLUE SHIELD | Admitting: Medical

## 2018-01-08 ENCOUNTER — Encounter: Payer: Self-pay | Admitting: Obstetrics and Gynecology

## 2018-01-08 ENCOUNTER — Other Ambulatory Visit: Payer: Self-pay | Admitting: Medical

## 2018-01-08 ENCOUNTER — Ambulatory Visit: Payer: BLUE CROSS/BLUE SHIELD | Admitting: Obstetrics and Gynecology

## 2018-01-08 ENCOUNTER — Telehealth: Payer: Self-pay | Admitting: Medical

## 2018-01-08 ENCOUNTER — Other Ambulatory Visit: Payer: Self-pay

## 2018-01-08 VITALS — BP 112/70 | HR 64 | Resp 16 | Wt 138.0 lb

## 2018-01-08 VITALS — BP 116/70 | HR 70 | Resp 16 | Ht 64.0 in | Wt 137.4 lb

## 2018-01-08 DIAGNOSIS — N814 Uterovaginal prolapse, unspecified: Secondary | ICD-10-CM | POA: Diagnosis not present

## 2018-01-08 DIAGNOSIS — N8111 Cystocele, midline: Secondary | ICD-10-CM

## 2018-01-08 DIAGNOSIS — R7989 Other specified abnormal findings of blood chemistry: Secondary | ICD-10-CM

## 2018-01-08 DIAGNOSIS — E785 Hyperlipidemia, unspecified: Secondary | ICD-10-CM

## 2018-01-08 DIAGNOSIS — Z4689 Encounter for fitting and adjustment of other specified devices: Secondary | ICD-10-CM | POA: Diagnosis not present

## 2018-01-08 DIAGNOSIS — R9431 Abnormal electrocardiogram [ECG] [EKG]: Secondary | ICD-10-CM

## 2018-01-08 NOTE — Progress Notes (Signed)
Bone Subjective: Chief Complaint  Patient presents with  . Follow-up    follow up on cholesterol and BP.     Here today with her daughter who interprets  Here to recheck cholesterol since she is not been taking a statin in the last 2 months as a trial.  She has been on cholesterol medicine in the past when her cholesterol numbers are higher and when she had a blood pressure issue.  However in the last year she has been really good about her diet and exercise, eats mainly fish, vegetables, grains and fruits.  She does not smoke she does not eat any other meat processed foods, no alcohol.  She has no other complaint.  Past Medical History:  Diagnosis Date  . Chronic neck pain   . Hyperlipidemia   . Hypertension   . Shoulder pain    Current Outpatient Medications on File Prior to Visit  Medication Sig Dispense Refill  . betamethasone valerate ointment (VALISONE) 0.1 % Apply a pea sized amount topically BID for the next 1-2 weeks 15 g 0  . conjugated estrogens (PREMARIN) vaginal cream 1 gram vaginally twice weekly at bedtime. 42.5 g 1   No current facility-administered medications on file prior to visit.    ROS as in subjective  Objective: BP 116/70   Pulse 70   Resp 16   Ht  (1.626 m)   Wt 137 lb 6.4 oz (62.3 kg)   LMP 10/06/2012   SpO2 98%   BMI 23.58 kg/m   General appearance: alert, no distress, WD/WN,  Neck: supple, no lymphadenopathy, no thyromegaly, no masses Heart: RRR, normal S1, S2, no murmurs Lungs: CTA bilaterally, no wheezes, rhonchi, or rales Pulses: 2+ symmetric, upper and lower extremities, normal cap refill Neuro: non focal exam, normal DTRs    Assessment: Encounter Diagnoses  Name Primary?  . Hyperlipidemia, unspecified hyperlipidemia type Yes  . Abnormal TSH     Plan: hyperlipidemia - doing great with diet and exercise. Lipid lab today   Addendum: Abnormal TSH with normal T4.  I discussed case via e-mail with endocrinology Dr. Elvera Lennox  who recommended baseline eval with bone density test and echocardiogram.    If these are normal, we can continue to monitor thyroid function given stability of the labs.  Will discuss with patient by phone

## 2018-01-08 NOTE — Telephone Encounter (Signed)
Have her call [facility] for bone density appt and call to set up echo  Please call to schedule your bone density test  [facility]  [phone] [address], [address] [place], [place] [address]

## 2018-01-08 NOTE — Progress Notes (Signed)
GYNECOLOGY  VISIT   HPI: 52 y.o.   Married  [place]  female   [phone] with Patient's last menstrual period was 10/06/2012.   here for pessary insertion. She has a cystocele and uterine prolapse. She was here for a pessary fitting on 12/19/17, fitted with a #2 ring pessary with support  GYNECOLOGIC HISTORY: Patient's last menstrual period was 10/06/2012. Contraception:postmenopause  Menopausal hormone therapy: none         OB History    Gravida  3   Para  3   Term  3   Preterm      AB      Living  3     SAB      TAB      Ectopic      Multiple      Live Births  3              Patient Active Problem List   Diagnosis Date Noted  . Abnormal TSH 01/08/2018  . Female bladder prolapse 06/18/2017  . Dizziness 01/13/2017  . Hyperlipidemia 01/13/2017  . Weight gain 12/13/2016  . Edema 12/13/2016  . Pelvic pain in female 10/27/2015  . Chronic right shoulder pain 10/27/2015  . Encounter for health maintenance examination in adult 10/27/2015  . Special screening for malignant neoplasms, colon 10/27/2015  . Screening for breast cancer 10/27/2015    Past Medical History:  Diagnosis Date  . Chronic neck pain   . Hyperlipidemia   . Hypertension   . Shoulder pain     Past Surgical History:  Procedure Laterality Date  . BREAST LUMPECTOMY Right 2010  . CYST EXCISION     upper chest/ breast ([facility]), 1 week hospitalization  . JEJUNOSTOMY FEEDING TUBE     prior with surgery  . TONSILLECTOMY     Right tonsillectomy, 2wk hospitalization ([facility])    Current Outpatient Medications  Medication Sig Dispense Refill  . betamethasone valerate ointment (VALISONE) 0.1 % Apply a pea sized amount topically BID for the next 1-2 weeks 15 g 0  . conjugated estrogens (PREMARIN) vaginal cream 1 gram vaginally twice weekly at bedtime. 42.5 g 1   No current facility-administered medications for this visit.      ALLERGIES: Hydrocodone  Family History  Problem Relation Age of  Onset  . Hypertension Father   . Cancer Neg Hx   . Diabetes Neg Hx   . Heart disease Neg Hx   . Stroke Neg Hx     Social History   Socioeconomic History  . Marital status: Married    Spouse name: Not on file  . Number of children: Not on file  . Years of education: Not on file  . Highest education level: Not on file  Occupational History  . Not on file  Social Needs  . Financial resource strain: Not on file  . Food insecurity:    Worry: Not on file    Inability: Not on file  . Transportation needs:    Medical: Not on file    Non-medical: Not on file  Tobacco Use  . Smoking status: Never Smoker  . Smokeless tobacco: Never Used  Substance and Sexual Activity  . Alcohol use: No    Alcohol/week: 0.0 oz  . Drug use: No  . Sexual activity: Not Currently    Partners: Male    Birth control/protection: None, Post-menopausal  Lifestyle  . Physical activity:    Days per week: Not on file    Minutes per  session: Not on file  . Stress: Not on file  Relationships  . Social connections:    Talks on phone: Not on file    Gets together: Not on file    Attends religious service: Not on file    Active member of club or organization: Not on file    Attends meetings of clubs or organizations: Not on file    Relationship status: Not on file  . Intimate partner violence:    Fear of current or ex partner: Not on file    Emotionally abused: Not on file    Physically abused: Not on file    Forced sexual activity: Not on file  Other Topics Concern  . Not on file  Social History Narrative   Lives with husband, 3 children, ages 84yo, 24yo, 65yo, works in Designer, fashion/clothing, various jobs, some exercise with walking    Review of Systems  Constitutional: Negative.   HENT: Negative.   Eyes: Negative.   Respiratory: Negative.   Cardiovascular: Positive for leg swelling.  Gastrointestinal: Negative.   Genitourinary: Negative.   Musculoskeletal: Negative.   Skin: Negative.   Neurological:  Negative.   Endo/Heme/Allergies: Negative.   Psychiatric/Behavioral: Negative.     PHYSICAL EXAMINATION:    BP 112/70 (BP Location: Right Arm, Patient Position: Sitting, Cuff Size: Normal)   Pulse 64   Resp 16   Wt 138 lb (62.6 kg)   LMP 10/06/2012   BMI 23.69 kg/m     General appearance: alert, cooperative and appears stated age   Pelvic: External genitalia:  no lesions              Urethra:  normal appearing urethra with no masses, tenderness or lesions              Bartholins and Skenes: normal                 #2 ring pessary with support placed. Comfortable in the office, patient walked around and voided without problems.   Chaperone was present for exam.  ASSESSMENT Genital prolapse    PLAN #2 ring pessary with support placed. Patient comfortable.  Questions all answered.   An After Visit Summary was printed and given to the patient.  Patient was here with an interpreter.

## 2018-01-08 NOTE — Telephone Encounter (Signed)
Spoke to patient's daughter and she agrees to have both referrals done.

## 2018-01-08 NOTE — Telephone Encounter (Signed)
Please call and talk to her daughter who will interpret.  I spoke with an endocrinologist about her abnormal thyroid labs.  She recommended we check 2 things, one is a bone density test to check for osteoporosis as well as an ultrasound of the heart to check for any kind of heart abnormality.  Both of these can be affected by the thyroid.  If agreeable, let schedule her for both.  I will need to put in the order.

## 2018-01-09 LAB — LIPID PANEL
Chol/HDL Ratio: 3.9 ratio (ref 0.0–4.4)
Cholesterol, Total: 163 mg/dL (ref 100–199)
HDL: 42 mg/dL (ref >39)
LDL Calculated: 87 mg/dL (ref 0–99)
TRIGLYCERIDES: 169 mg/dL — AB (ref 0–149)
VLDL Cholesterol Cal: 34 mg/dL (ref 5–40)

## 2018-01-09 LAB — PROLACTIN: PROLACTIN: 11.5 ng/mL (ref 4.8–23.3)

## 2018-01-09 NOTE — Telephone Encounter (Signed)
Peer to peer is needed for ECHO. Insurance:[employer]  ZO:[id number]   CPT: Echo 93306  ICD10: R94.31-Abnormal EKG             R79.89-Abnormal TSH   CALL:[phone]

## 2018-01-12 ENCOUNTER — Telehealth: Payer: Self-pay | Admitting: Obstetrics and Gynecology

## 2018-01-12 NOTE — Telephone Encounter (Signed)
Call placed to patients spouse "Chhatra", ok per dpr, using [employer] interpreter ID [phone].  Spoke with spouse, calling to schedule f/u with Dr. Oscar LaJertson. Denies any current problems or concerns, "everything good". Voiding with pessary in place, no pain or discomfort.  Per review of OV note 01/08/18, no f/u noted. Advised if no problems or concerns OV not needed. Will review with Dr. Oscar LaJertson and return call if any other recommendations. Spouse verbalizes understanding.   Routing to provider for final review. Patient is agreeable to disposition. Will close encounter.

## 2018-01-12 NOTE — Telephone Encounter (Signed)
Reviewed with Dr. Oscar LaJertson to clarify f/u. Call returned to patients spouse, "Chhatra", ok per dpr. OV scheduled for 01/16/18 at 4:15pm with Dr. Oscar LaJertson. Spouse is agreeable to date and time.   Routing to provider for final review. Encounter closed.

## 2018-01-12 NOTE — Telephone Encounter (Signed)
Patient's husband, okay per DPR, called and requested an appointment with Dr. Oscar LaJertson tomorrow or this Friday to recheck her pessary. He requested a nurse call him back to work her into the schedule on those days specifically because of her schedule, if possible.

## 2018-01-12 NOTE — Telephone Encounter (Signed)
Even though she feels well, the pessary can cause vaginal irritation, that the patient can't feel. It is very important that she has f/u. If she is doing well at her f/u visit her visit will be spaced out to one month and then 3 months. She needs to continue to be seen as long as she uses the pessary.

## 2018-01-13 ENCOUNTER — Telehealth: Payer: Self-pay | Admitting: Medical

## 2018-01-13 NOTE — Telephone Encounter (Signed)
Pt's husband, Milta DeitersChhatra, came by the office to let Vincenza HewsShane know that pt is having difficulty passing stool and difficulty with constipation. He said this started occurring when the change in cholesterol med happened. Now the problem is pretty bad. What can pt do about this? Also husband picked up literature on proper diet for cholesterol pt's and spoke to CMA about diet.

## 2018-01-13 NOTE — Telephone Encounter (Signed)
Have use Miralax over the counter, 1 cap full daily in a beverage.   Drink at least 1.8 liters water daily.

## 2018-01-14 ENCOUNTER — Ambulatory Visit: Payer: BLUE CROSS/BLUE SHIELD | Admitting: Medical

## 2018-01-14 NOTE — Progress Notes (Deleted)
GYNECOLOGY  VISIT   HPI: 52 y.o.   Married  [place]  female   [phone] with Patient's last menstrual period was 10/06/2012. here for pessary check.    GYNECOLOGIC HISTORY: Patient's last menstrual period was 10/06/2012. Contraception:Postmenopausal Menopausal hormone therapy: none        OB History    Gravida  3   Para  3   Term  3   Preterm      AB      Living  3     SAB      TAB      Ectopic      Multiple      Live Births  3              Patient Active Problem List   Diagnosis Date Noted  . Abnormal TSH 01/08/2018  . Female bladder prolapse 06/18/2017  . Dizziness 01/13/2017  . Hyperlipidemia 01/13/2017  . Weight gain 12/13/2016  . Edema 12/13/2016  . Pelvic pain in female 10/27/2015  . Chronic right shoulder pain 10/27/2015  . Encounter for health maintenance examination in adult 10/27/2015  . Special screening for malignant neoplasms, colon 10/27/2015  . Screening for breast cancer 10/27/2015    Past Medical History:  Diagnosis Date  . Chronic neck pain   . Hyperlipidemia   . Hypertension   . Shoulder pain     Past Surgical History:  Procedure Laterality Date  . BREAST LUMPECTOMY Right 2010  . CYST EXCISION     upper chest/ breast ([facility]), 1 week hospitalization  . JEJUNOSTOMY FEEDING TUBE     prior with surgery  . TONSILLECTOMY     Right tonsillectomy, 2wk hospitalization ([facility])    Current Outpatient Medications  Medication Sig Dispense Refill  . betamethasone valerate ointment (VALISONE) 0.1 % Apply a pea sized amount topically BID for the next 1-2 weeks 15 g 0  . conjugated estrogens (PREMARIN) vaginal cream 1 gram vaginally twice weekly at bedtime. 42.5 g 1   No current facility-administered medications for this visit.      ALLERGIES: Hydrocodone  Family History  Problem Relation Age of Onset  . Hypertension Father   . Cancer Neg Hx   . Diabetes Neg Hx   . Heart disease Neg Hx   . Stroke Neg Hx     Social History    Socioeconomic History  . Marital status: Married    Spouse name: Not on file  . Number of children: Not on file  . Years of education: Not on file  . Highest education level: Not on file  Occupational History  . Not on file  Social Needs  . Financial resource strain: Not on file  . Food insecurity:    Worry: Not on file    Inability: Not on file  . Transportation needs:    Medical: Not on file    Non-medical: Not on file  Tobacco Use  . Smoking status: Never Smoker  . Smokeless tobacco: Never Used  Substance and Sexual Activity  . Alcohol use: No    Alcohol/week: 0.0 oz  . Drug use: No  . Sexual activity: Not Currently    Partners: Male    Birth control/protection: None, Post-menopausal  Lifestyle  . Physical activity:    Days per week: Not on file    Minutes per session: Not on file  . Stress: Not on file  Relationships  . Social connections:    Talks on phone: Not on file  Gets together: Not on file    Attends religious service: Not on file    Active member of club or organization: Not on file    Attends meetings of clubs or organizations: Not on file    Relationship status: Not on file  . Intimate partner violence:    Fear of current or ex partner: Not on file    Emotionally abused: Not on file    Physically abused: Not on file    Forced sexual activity: Not on file  Other Topics Concern  . Not on file  Social History Narrative   Lives with husband, 3 children, ages 52yo, 52yo, 52yo, works in Designer, fashion/clothingtextiles, various jobs, some exercise with walking    ROS  PHYSICAL EXAMINATION:    LMP 10/06/2012     General appearance: alert, cooperative and appears stated age Neck: no adenopathy, supple, symmetrical, trachea midline and thyroid {CHL AMB PHY EX THYROID NORM DEFAULT:[phone]::"normal to inspection and palpation"} Breasts: {Exam; breast:13139::"normal appearance, no masses or tenderness"} Abdomen: soft, non-tender; non distended, no masses,  no  organomegaly  Pelvic: External genitalia:  no lesions              Urethra:  normal appearing urethra with no masses, tenderness or lesions              Bartholins and Skenes: normal                 Vagina: normal appearing vagina with normal color and discharge, no lesions              Cervix: {CHL AMB PHY EX CERVIX NORM DEFAULT:[phone]::"no lesions"}              Bimanual Exam:  Uterus:  {CHL AMB PHY EX UTERUS NORM DEFAULT:[phone]::"normal size, contour, position, consistency, mobility, non-tender"}              Adnexa: {CHL AMB PHY EX ADNEXA NO MASS DEFAULT:[phone]::"no mass, fullness, tenderness"}              Rectovaginal: {yes no:[id number]}.  Confirms.              Anus:  normal sphincter tone, no lesions  Chaperone was present for exam.  ASSESSMENT     PLAN    An After Visit Summary was printed and given to the patient.  *** minutes face to face time of which over 50% was spent in counseling.

## 2018-01-15 ENCOUNTER — Telehealth: Payer: Self-pay | Admitting: Obstetrics and Gynecology

## 2018-01-15 NOTE — Telephone Encounter (Signed)
Called pt and spoke to husband, Milta DeitersChhatra, and gave him M.D.C. HoldingsShane's instruction

## 2018-01-15 NOTE — Telephone Encounter (Signed)
Please make sure this patient follow's up. She had a pessary placed last week and has canceled her f/u visit. Need to speak through an interpreter.  It's important to make sure her vagina isn't getting irritated. Please call her and see how she is feeling and work on getting her another appointment.

## 2018-01-15 NOTE — Telephone Encounter (Signed)
Patient cancelled pessary recheck appointment and will call back to reschedule.

## 2018-01-16 ENCOUNTER — Encounter: Payer: Self-pay | Admitting: Obstetrics and Gynecology

## 2018-01-16 ENCOUNTER — Ambulatory Visit (INDEPENDENT_AMBULATORY_CARE_PROVIDER_SITE_OTHER): Payer: BLUE CROSS/BLUE SHIELD | Admitting: Obstetrics and Gynecology

## 2018-01-16 ENCOUNTER — Ambulatory Visit: Payer: Self-pay | Admitting: Obstetrics and Gynecology

## 2018-01-16 VITALS — BP 110/72 | HR 70 | Ht 63.75 in | Wt 137.0 lb

## 2018-01-16 DIAGNOSIS — N8111 Cystocele, midline: Secondary | ICD-10-CM

## 2018-01-16 DIAGNOSIS — Z4689 Encounter for fitting and adjustment of other specified devices: Secondary | ICD-10-CM | POA: Diagnosis not present

## 2018-01-16 NOTE — Telephone Encounter (Signed)
Call placed to [name], ok per dpr, using [employer] Interpreter ID [phone]. Advised as seen below per Dr. Reyne DumasJerston. OV rescheduled for today at 4:15pm .    Routing to provider for final review. Patient is agreeable to disposition. Will close encounter.

## 2018-01-16 NOTE — Progress Notes (Signed)
GYNECOLOGY  VISIT   HPI: 52 y.o.   Married  [place]  female   [phone] with Patient's last menstrual period was 10/06/2012.here for pessary check. The patient had a #2 ring pessary with support placed last week and is here for f/u. No problems. She can't feel the pessary or the bulge. She is voiding better with the pessary in. No bowel c/o. No bleeding or abnormal vaginal d/c.  Not sexually active.   GYNECOLOGIC HISTORY: Patient's last menstrual period was 10/06/2012. Contraception:Postmenopausal Menopausal hormone therapy: none        OB History    Gravida  3   Para  3   Term  3   Preterm      AB      Living  3     SAB      TAB      Ectopic      Multiple      Live Births  3              Patient Active Problem List   Diagnosis Date Noted  . Abnormal TSH 01/08/2018  . Female bladder prolapse 06/18/2017  . Dizziness 01/13/2017  . Hyperlipidemia 01/13/2017  . Weight gain 12/13/2016  . Edema 12/13/2016  . Pelvic pain in female 10/27/2015  . Chronic right shoulder pain 10/27/2015  . Encounter for health maintenance examination in adult 10/27/2015  . Special screening for malignant neoplasms, colon 10/27/2015  . Screening for breast cancer 10/27/2015    Past Medical History:  Diagnosis Date  . Chronic neck pain   . Hyperlipidemia   . Hypertension   . Shoulder pain     Past Surgical History:  Procedure Laterality Date  . BREAST LUMPECTOMY Right 2010  . CYST EXCISION     upper chest/ breast ([facility]), 1 week hospitalization  . JEJUNOSTOMY FEEDING TUBE     prior with surgery  . TONSILLECTOMY     Right tonsillectomy, 2wk hospitalization ([facility])    Current Outpatient Medications  Medication Sig Dispense Refill  . betamethasone valerate ointment (VALISONE) 0.1 % Apply a pea sized amount topically BID for the next 1-2 weeks 15 g 0  . conjugated estrogens (PREMARIN) vaginal cream 1 gram vaginally twice weekly at bedtime. 42.5 g 1   No current  facility-administered medications for this visit.      ALLERGIES: Hydrocodone  Family History  Problem Relation Age of Onset  . Hypertension Father   . Cancer Neg Hx   . Diabetes Neg Hx   . Heart disease Neg Hx   . Stroke Neg Hx     Social History   Socioeconomic History  . Marital status: Married    Spouse name: Not on file  . Number of children: Not on file  . Years of education: Not on file  . Highest education level: Not on file  Occupational History  . Not on file  Social Needs  . Financial resource strain: Not on file  . Food insecurity:    Worry: Not on file    Inability: Not on file  . Transportation needs:    Medical: Not on file    Non-medical: Not on file  Tobacco Use  . Smoking status: Never Smoker  . Smokeless tobacco: Never Used  Substance and Sexual Activity  . Alcohol use: No    Alcohol/week: 0.0 oz  . Drug use: No  . Sexual activity: Not Currently    Partners: Male    Birth control/protection: None, Post-menopausal  Lifestyle  . Physical activity:    Days per week: Not on file    Minutes per session: Not on file  . Stress: Not on file  Relationships  . Social connections:    Talks on phone: Not on file    Gets together: Not on file    Attends religious service: Not on file    Active member of club or organization: Not on file    Attends meetings of clubs or organizations: Not on file    Relationship status: Not on file  . Intimate partner violence:    Fear of current or ex partner: Not on file    Emotionally abused: Not on file    Physically abused: Not on file    Forced sexual activity: Not on file  Other Topics Concern  . Not on file  Social History Narrative   Lives with husband, 3 children, ages 52yo, 52yo, 52yo, works in Designer, fashion/clothingtextiles, various jobs, some exercise with walking    Review of Systems  Constitutional: Negative.   HENT: Negative.   Eyes: Negative.   Respiratory: Negative.   Cardiovascular: Negative.    Gastrointestinal: Negative.   Genitourinary: Negative.   Musculoskeletal: Negative.   Skin: Negative.   Neurological: Positive for headaches.  Endo/Heme/Allergies: Negative.   Psychiatric/Behavioral: Negative.     PHYSICAL EXAMINATION:    BP 110/72 (BP Location: Right Arm, Patient Position: Sitting, Cuff Size: Normal)   Pulse 70   Ht 5' 3.75" (1.619 m)   Wt 137 lb (62.1 kg)   LMP 10/06/2012   BMI 23.70 kg/m     General appearance: alert, cooperative and appears stated age  Pelvic: External genitalia:  no lesions              Urethra:  normal appearing urethra with no masses, tenderness or lesions              Bartholins and Skenes: normal                 Vagina: the pessary was removed and cleaned. No vaginal irritation. Pessary replaced              Cervix: no lesions               Chaperone was present for exam.  ASSESSMENT Pessary check, doing well    PLAN Continue with premarin cream 2 x a week F/U in one month Call with any concerns    An After Visit Summary was printed and given to the patient.

## 2018-01-20 ENCOUNTER — Telehealth: Payer: Self-pay | Admitting: Medical

## 2018-01-20 ENCOUNTER — Encounter (HOSPITAL_COMMUNITY): Payer: Self-pay | Admitting: Medical

## 2018-01-20 NOTE — Telephone Encounter (Signed)
Spoke to pt's husband, Becky Goodwin, and explained that we have been advised that Echo will not be covered by insurance. Husband said [facility] called him and rescheduled appt to 01/30/18 but he was not aware that insurance would not cover the test. Husband wants to know the cost of the test so he will be contacting [facility] to get cost. Also he wants to know why insurance will not cover the test.

## 2018-01-21 ENCOUNTER — Ambulatory Visit (HOSPITAL_COMMUNITY): Payer: BLUE CROSS/BLUE SHIELD

## 2018-01-26 NOTE — Telephone Encounter (Signed)
pls reply back with yes answers to questions listed on the form about last echo > 1 year (yes), and new symptoms since last echo (yes).  To my knowledge, she has never had an echo

## 2018-01-27 ENCOUNTER — Telehealth: Payer: Self-pay | Admitting: Medical

## 2018-01-27 NOTE — Telephone Encounter (Signed)
Pt's husband, Milta DeitersChhatra, came bay the office yesterday stating that they received a letter from their insurance stating that the echo was not medically necessary and not covered. Husband state that hospital informed them to let them know if they want to keep the 01/30/18 appt & that pt will need to pay the service all out of pocket. Vincenza HewsShane decided to refer pt to endocrinology instead. Called husband and informed him of this decision.

## 2018-01-27 NOTE — Telephone Encounter (Signed)
[facility], a couple things, The phone number listed is incorrect.  It is actually an [phone] not an [phone] number  I just called on 01/27/2018 at 9:12am in the morning and they told me the case was closed 3 days after the initial order since we did not call back within 3 days.   I have voiced my concerns with the peer to peer line that they are using ridiculous reasons for denying the claim.  They note the claim was denied because that they had no information that she had not already had an echocardiogram within a year.  I did not know that they had this questions.  Nevertheless, she has not had an echocardiogram ever.  Since the case was closed, they said I could call the appeals line (and spend another 30 minutes on the phone).   They said without going through appeals process, a new case could not be open for 60 days.    With that being said, please refer her to Dr. Elvera LennoxGherghe, endocrinology for abnormal thyroid labs, possibly getting their leverage to order an echocardiogram as her insurance wants to play games.

## 2018-01-27 NOTE — Telephone Encounter (Signed)
Referral placed to Endocrinology.  

## 2018-01-27 NOTE — Telephone Encounter (Signed)
Please review the forms and note that was place in your folder. You will have to do a peer to peer for the patient and the information needed is attached to the form.

## 2018-01-30 ENCOUNTER — Other Ambulatory Visit (HOSPITAL_COMMUNITY): Payer: BLUE CROSS/BLUE SHIELD

## 2018-02-02 ENCOUNTER — Telehealth: Payer: Self-pay | Admitting: Obstetrics and Gynecology

## 2018-02-02 NOTE — Telephone Encounter (Signed)
Patient's husband called requesting an appointment for tomorrow for his wife. Stating that she was having a problem, but was unable to give any further information.

## 2018-02-02 NOTE — Telephone Encounter (Addendum)
Spoke with patient's husband Chhatra, okay per ROI. Husband states the patient is having external vaginal pain and would like an appointment tomorrow for evaluation. Unable to provide further information. Appointment scheduled for tomorrow 6/25 at 3:15 pm with Dr.Jertson.  Routing to provider for final review. Patient agreeable to disposition. Will close encounter.

## 2018-02-03 ENCOUNTER — Encounter: Payer: Self-pay | Admitting: Obstetrics and Gynecology

## 2018-02-03 ENCOUNTER — Ambulatory Visit: Payer: BLUE CROSS/BLUE SHIELD | Admitting: Obstetrics and Gynecology

## 2018-02-03 ENCOUNTER — Other Ambulatory Visit: Payer: Self-pay

## 2018-02-03 VITALS — BP 102/60 | HR 84 | Temp 98.2°F | Resp 18 | Wt 138.0 lb

## 2018-02-03 DIAGNOSIS — N760 Acute vaginitis: Secondary | ICD-10-CM | POA: Diagnosis not present

## 2018-02-03 DIAGNOSIS — R300 Dysuria: Secondary | ICD-10-CM | POA: Diagnosis not present

## 2018-02-03 DIAGNOSIS — R1030 Lower abdominal pain, unspecified: Secondary | ICD-10-CM | POA: Diagnosis not present

## 2018-02-03 LAB — POCT URINALYSIS DIPSTICK
BILIRUBIN UA: NEGATIVE
Blood, UA: NEGATIVE
GLUCOSE UA: NEGATIVE
Ketones, UA: NEGATIVE
NITRITE UA: NEGATIVE
Protein, UA: NEGATIVE
pH, UA: 6 (ref 5.0–8.0)

## 2018-02-03 MED ORDER — PHENAZOPYRIDINE HCL 200 MG PO TABS: 200.0000 mg | ORAL_TABLET | Freq: Three times a day (TID) | ORAL | 0 refills | Status: DC | PRN

## 2018-02-03 MED ORDER — SULFAMETHOXAZOLE-TRIMETHOPRIM 800-160 MG PO TABS: 1.0000 | ORAL_TABLET | Freq: Two times a day (BID) | ORAL | 0 refills | Status: DC

## 2018-02-03 NOTE — Progress Notes (Signed)
GYNECOLOGY  VISIT   HPI: 52 y.o.   Married  [place]  female   [phone] with Patient's last menstrual period was 10/06/2012.   here c/o vaginal pain and LLQ pain    The patient had a #2 ring pessary with support placed on 01/08/18. She was doing well at her f/u visit on 01/16/18. She now presents c/o 5 day h/o vaginal pain and pain in her LLQ. She notices that her underwear is wet, not sure if d/c or urine. Hurts to sit or walk. Feels better when she in lying down. Not always feeling like she is emptying her bladder in the last 5 days. The prolapse is itchy and painful. BM are okay She c/o frequent urination, burning.   GYNECOLOGIC HISTORY: Patient's last menstrual period was 10/06/2012. Contraception:postmenopause  Menopausal hormone therapy: Premarin         OB History    Gravida  3   Para  3   Term  3   Preterm      AB      Living  3     SAB      TAB      Ectopic      Multiple      Live Births  3              Patient Active Problem List   Diagnosis Date Noted  . Abnormal TSH 01/08/2018  . Female bladder prolapse 06/18/2017  . Dizziness 01/13/2017  . Hyperlipidemia 01/13/2017  . Weight gain 12/13/2016  . Edema 12/13/2016  . Pelvic pain in female 10/27/2015  . Chronic right shoulder pain 10/27/2015  . Encounter for health maintenance examination in adult 10/27/2015  . Special screening for malignant neoplasms, colon 10/27/2015  . Screening for breast cancer 10/27/2015    Past Medical History:  Diagnosis Date  . Chronic neck pain   . Hyperlipidemia   . Hypertension   . Shoulder pain     Past Surgical History:  Procedure Laterality Date  . BREAST LUMPECTOMY Right 2010  . CYST EXCISION     upper chest/ breast ([facility]), 1 week hospitalization  . JEJUNOSTOMY FEEDING TUBE     prior with surgery  . TONSILLECTOMY     Right tonsillectomy, 2wk hospitalization ([facility])    Current Outpatient Medications  Medication Sig Dispense Refill  . betamethasone  valerate ointment (VALISONE) 0.1 % Apply a pea sized amount topically BID for the next 1-2 weeks 15 g 0  . conjugated estrogens (PREMARIN) vaginal cream 1 gram vaginally twice weekly at bedtime. 42.5 g 1   No current facility-administered medications for this visit.      ALLERGIES: Hydrocodone  Family History  Problem Relation Age of Onset  . Hypertension Father   . Cancer Neg Hx   . Diabetes Neg Hx   . Heart disease Neg Hx   . Stroke Neg Hx     Social History   Socioeconomic History  . Marital status: Married    Spouse name: Not on file  . Number of children: Not on file  . Years of education: Not on file  . Highest education level: Not on file  Occupational History  . Not on file  Social Needs  . Financial resource strain: Not on file  . Food insecurity:    Worry: Not on file    Inability: Not on file  . Transportation needs:    Medical: Not on file    Non-medical: Not on file  Tobacco  Use  . Smoking status: Never Smoker  . Smokeless tobacco: Never Used  Substance and Sexual Activity  . Alcohol use: No    Alcohol/week: 0.0 oz  . Drug use: No  . Sexual activity: Not Currently    Partners: Male    Birth control/protection: None, Post-menopausal  Lifestyle  . Physical activity:    Days per week: Not on file    Minutes per session: Not on file  . Stress: Not on file  Relationships  . Social connections:    Talks on phone: Not on file    Gets together: Not on file    Attends religious service: Not on file    Active member of club or organization: Not on file    Attends meetings of clubs or organizations: Not on file    Relationship status: Not on file  . Intimate partner violence:    Fear of current or ex partner: Not on file    Emotionally abused: Not on file    Physically abused: Not on file    Forced sexual activity: Not on file  Other Topics Concern  . Not on file  Social History Narrative   Lives with husband, 3 children, ages 52yo, 52yo, 925yo,  works in Designer, fashion/clothingtextiles, various jobs, some exercise with walking    Review of Systems  Constitutional: Negative.   HENT: Negative.   Eyes: Negative.   Respiratory: Negative.   Cardiovascular: Negative.   Gastrointestinal: Negative.   Genitourinary:       Vaginal pain LLQ pain   Musculoskeletal: Negative.   Skin: Negative.   Neurological: Negative.   Endo/Heme/Allergies: Negative.   Psychiatric/Behavioral: Negative.     PHYSICAL EXAMINATION:    BP 102/60 (BP Location: Right Arm, Patient Position: Sitting, Cuff Size: Normal)   Pulse 84   Resp 18   Wt 138 lb (62.6 kg)   LMP 10/06/2012   BMI 23.87 kg/m     General appearance: alert, cooperative and appears stated age Abdomen: soft, mildly tender BLQ, no rebound, no guarding, non distended, no masses,  no organomegaly  Pelvic: External genitalia:  no lesions              Urethra:  normal appearing urethra with no masses, tenderness or lesions              Bartholins and Skenes: normal                 Vagina: normal appearing vagina with normal color and discharge, no lesions, pessary was in place, removed              Cervix: no lesions and slighlty friable, no CMT              Bimanual Exam:  Mild diffuse tenderness                Chaperone was present for exam.  ASSESSMENT Abdominal pain, urinary symptoms Suspect vaginitis H/O prolapse, pessary was in place, no vaginal irritation    PLAN Send urine for ua, c&s Will treat for presumed UTI with bactrim and pyridium Affirm sent Will leave the pessary out CBC with diff If she develops worsening pain or fever, she needs to go to the ER F/U next week, bring the pessary. We can further discuss options at that visit (daughter is asking about surgery). She also is asking about the pessary, some prolapsing in front the pessary, not bothersome.    An After Visit Summary was printed and  given to the patient.  ~25 minutes face to face time of which over 50% was spent in  counseling.

## 2018-02-03 NOTE — Patient Instructions (Signed)
Call with fever, worsening pain or any other concerns

## 2018-02-04 LAB — VAGINITIS/VAGINOSIS, DNA PROBE
CANDIDA SPECIES: NEGATIVE
GARDNERELLA VAGINALIS: NEGATIVE
TRICHOMONAS VAG: NEGATIVE

## 2018-02-04 LAB — CBC WITH DIFFERENTIAL/PLATELET
BASOS ABS: 0 10*3/uL (ref 0.0–0.2)
BASOS: 0 %
EOS (ABSOLUTE): 0.2 10*3/uL (ref 0.0–0.4)
Eos: 3 %
Hematocrit: 38.4 % (ref 34.0–46.6)
Hemoglobin: 12.5 g/dL (ref 11.1–15.9)
Immature Grans (Abs): 0 10*3/uL (ref 0.0–0.1)
Immature Granulocytes: 0 %
LYMPHS ABS: 3 10*3/uL (ref 0.7–3.1)
Lymphs: 48 %
MCH: 28.9 pg (ref 26.6–33.0)
MCHC: 32.6 g/dL (ref 31.5–35.7)
MCV: 89 fL (ref 79–97)
MONOS ABS: 0.4 10*3/uL (ref 0.1–0.9)
Monocytes: 6 %
Neutrophils Absolute: 2.8 10*3/uL (ref 1.4–7.0)
Neutrophils: 43 %
PLATELETS: 272 10*3/uL (ref 150–450)
RBC: 4.33 x10E6/uL (ref 3.77–5.28)
RDW: 13.7 % (ref 12.3–15.4)
WBC: 6.4 10*3/uL (ref 3.4–10.8)

## 2018-02-04 LAB — URINALYSIS, MICROSCOPIC ONLY
BACTERIA UA: NONE SEEN
Casts: NONE SEEN /lpf
Epithelial Cells (non renal): NONE SEEN /hpf (ref 0–10)

## 2018-02-05 LAB — URINE CULTURE

## 2018-02-19 NOTE — Progress Notes (Signed)
GYNECOLOGY  VISIT   HPI: 52 y.o.   Married  [place]  female   [phone] with Patient's last menstrual period was 10/06/2012.   here for   1 week follow up  The patient has a h/o a grade 2-3 cystocele, grade 2 uterine prolapse and was recently fitted with a #2 ring pessary with support. She was seen a few weeks ago with pain, she had a UTI and was treated with antibiotics. The pessary was left out at that visit. She is here for f/u. Her pain is better, but she is now itchy. No vaginal discharge.  GYNECOLOGIC HISTORY: Patient's last menstrual period was 10/06/2012. Contraception: postmenopausal  Menopausal hormone therapy: premarin         OB History    Gravida  3   Para  3   Term  3   Preterm      AB      Living  3     SAB      TAB      Ectopic      Multiple      Live Births  3              Patient Active Problem List   Diagnosis Date Noted  . Abnormal TSH 01/08/2018  . Female bladder prolapse 06/18/2017  . Dizziness 01/13/2017  . Hyperlipidemia 01/13/2017  . Weight gain 12/13/2016  . Edema 12/13/2016  . Pelvic pain in female 10/27/2015  . Chronic right shoulder pain 10/27/2015  . Encounter for health maintenance examination in adult 10/27/2015  . Special screening for malignant neoplasms, colon 10/27/2015  . Screening for breast cancer 10/27/2015    Past Medical History:  Diagnosis Date  . Chronic neck pain   . Hyperlipidemia   . Hypertension   . Shoulder pain     Past Surgical History:  Procedure Laterality Date  . BREAST LUMPECTOMY Right 2010  . CYST EXCISION     upper chest/ breast ([facility]), 1 week hospitalization  . JEJUNOSTOMY FEEDING TUBE     prior with surgery  . TONSILLECTOMY     Right tonsillectomy, 2wk hospitalization ([facility])    Current Outpatient Medications  Medication Sig Dispense Refill  . betamethasone valerate ointment (VALISONE) 0.1 % Apply a pea sized amount topically BID for the next 1-2 weeks 15 g 0  . conjugated estrogens  (PREMARIN) vaginal cream 1 gram vaginally twice weekly at bedtime. 42.5 g 1   No current facility-administered medications for this visit.      ALLERGIES: Hydrocodone  Family History  Problem Relation Age of Onset  . Hypertension Father   . Cancer Neg Hx   . Diabetes Neg Hx   . Heart disease Neg Hx   . Stroke Neg Hx     Social History   Socioeconomic History  . Marital status: Married    Spouse name: Not on file  . Number of children: Not on file  . Years of education: Not on file  . Highest education level: Not on file  Occupational History  . Not on file  Social Needs  . Financial resource strain: Not on file  . Food insecurity:    Worry: Not on file    Inability: Not on file  . Transportation needs:    Medical: Not on file    Non-medical: Not on file  Tobacco Use  . Smoking status: Never Smoker  . Smokeless tobacco: Never Used  Substance and Sexual Activity  . Alcohol use: No  Alcohol/week: 0.0 oz  . Drug use: No  . Sexual activity: Not Currently    Partners: Male    Birth control/protection: None, Post-menopausal  Lifestyle  . Physical activity:    Days per week: Not on file    Minutes per session: Not on file  . Stress: Not on file  Relationships  . Social connections:    Talks on phone: Not on file    Gets together: Not on file    Attends religious service: Not on file    Active member of club or organization: Not on file    Attends meetings of clubs or organizations: Not on file    Relationship status: Not on file  . Intimate partner violence:    Fear of current or ex partner: Not on file    Emotionally abused: Not on file    Physically abused: Not on file    Forced sexual activity: Not on file  Other Topics Concern  . Not on file  Social History Narrative   Lives with husband, 3 children, ages 39yo, 55yo, 28yo, works in Designer, fashion/clothing, various jobs, some exercise with walking    Review of Systems  Constitutional: Negative.   HENT: Negative.    Eyes: Negative.   Respiratory: Negative.   Cardiovascular: Negative.   Gastrointestinal: Negative.   Genitourinary: Negative.        Vulvar/vaginal itching Vaginal pain   Skin: Negative.   Neurological: Negative.   Endo/Heme/Allergies: Negative.   Psychiatric/Behavioral: Negative.     PHYSICAL EXAMINATION:    BP 106/64 (BP Location: Right Arm, Patient Position: Sitting, Cuff Size: Normal)   Pulse 72   Resp 14   Ht 5\' 4"  (1.626 m)   Wt 137 lb 4 oz (62.3 kg)   LMP 10/06/2012   BMI 23.56 kg/m     General appearance: alert, cooperative and appears stated age  Pelvic: External genitalia:  no lesions              Urethra:  normal appearing urethra with no masses, tenderness or lesions              Bartholins and Skenes: normal                 Vagina: atrophic appearing vagina with normal color, no discharge              Cervix: no lesions               Chaperone was present for exam.  Wet prep: no clue, no trich, few wbc, +parabaslilar cells KOH: no yeast PH: 5.5   ASSESSMENT Vulvovaginitis Genital prolapse Vaginal atrophy     PLAN Affirm sent Pessary replaced F/U in one month Explained the prolapse again and discussed options of doing nothing, pessary and surgery. Discussed surgery, ACOG handout given Valisone for pruritus   An After Visit Summary was printed and given to the patient.  ~25 minutes face to face time of which over 50% was spent in counseling.

## 2018-02-20 ENCOUNTER — Encounter: Payer: Self-pay | Admitting: Obstetrics and Gynecology

## 2018-02-20 ENCOUNTER — Other Ambulatory Visit: Payer: Self-pay

## 2018-02-20 ENCOUNTER — Ambulatory Visit: Payer: BLUE CROSS/BLUE SHIELD | Admitting: Obstetrics and Gynecology

## 2018-02-20 VITALS — BP 106/64 | HR 72 | Resp 14 | Ht 64.0 in | Wt 137.2 lb

## 2018-02-20 DIAGNOSIS — N8111 Cystocele, midline: Secondary | ICD-10-CM

## 2018-02-20 DIAGNOSIS — N760 Acute vaginitis: Secondary | ICD-10-CM | POA: Diagnosis not present

## 2018-02-20 DIAGNOSIS — N814 Uterovaginal prolapse, unspecified: Secondary | ICD-10-CM

## 2018-02-20 DIAGNOSIS — N952 Postmenopausal atrophic vaginitis: Secondary | ICD-10-CM | POA: Diagnosis not present

## 2018-02-20 MED ORDER — BETAMETHASONE VALERATE 0.1 % EX OINT
TOPICAL_OINTMENT | CUTANEOUS | 0 refills | Status: DC
Start: 2018-02-20 — End: 2018-03-10

## 2018-02-23 LAB — VAGINITIS/VAGINOSIS, DNA PROBE
Candida Species: NEGATIVE
Gardnerella vaginalis: POSITIVE — AB
TRICHOMONAS VAG: NEGATIVE

## 2018-02-24 ENCOUNTER — Telehealth: Payer: Self-pay | Admitting: *Deleted

## 2018-02-24 NOTE — Telephone Encounter (Signed)
Notes recorded by Leda MinHamm, Tra Wilemon N, RN on 02/24/2018 at 8:34 AM EDT Call placed using [employer] ID # [phone], left message on mobile and home numbers to return [facility] [place] [facility] at [phone].

## 2018-02-24 NOTE — Telephone Encounter (Signed)
-----   Message from Romualdo BolkJill Evelyn Jertson, MD sent at 02/23/2018 12:30 PM EDT ----- Please inform the patient that her vaginitis probe was + for BV and treat with flagyl (either oral or vaginal, her choice), no ETOH while on Flagyl.  Oral: Flagyl 500 mg BID x 7 days, or Vaginal: Metrogel, 1 applicator per vagina q day x 5 days.

## 2018-03-03 MED ORDER — METRONIDAZOLE 500 MG PO TABS: 500.0000 mg | ORAL_TABLET | Freq: Two times a day (BID) | ORAL | 0 refills | Status: DC

## 2018-03-03 NOTE — Telephone Encounter (Signed)
Spoke with spouse "Chhatra" using [employer] ID # [id number], ok per dpr. Advised as seen below per Dr. Oscar LaJertson. Rx for Flagyl po to verified pharmacy, ETOH precautions reviewed. Spouse verbalizes understanding and is agreeable. Encounter closed.

## 2018-03-04 ENCOUNTER — Telehealth: Payer: Self-pay | Admitting: Obstetrics and Gynecology

## 2018-03-04 NOTE — Telephone Encounter (Signed)
Patient's husband came in to get a letter for disability with the number of times patient has been seen. She is not getting better and he is concerned.

## 2018-03-05 NOTE — Telephone Encounter (Signed)
Call placed to patients spouse, Milta DeitersChhatra, using [employer] ID [id number].   Patient started Flagyl for BV this week. Spouse reports continued vaginal pain and burning "inside vagina". Spouse requesting referral to "belly specialist" and disability forms be completed because spouse is missing too much work due to pain. Patient is scheduled to see PCP tomorrow at 11:15am. Advised to keep PCP appt as scheduled, recommended OV with Dr. Oscar LaJertson. Advised will review schedule with Dr. Oscar LaJertson and return call. Spouse verbalizes understanding.

## 2018-03-05 NOTE — Telephone Encounter (Signed)
Reviewed with Dr. Oscar LaJertson.   Call returned to patient using [employer] ID [id number].   Spoke with [name]. Advised to keep OV as scheduled for 7/26 with PCP for further evaluation of pain. [facility] with [facility] to provide update, will then review with Dr. Oscar LaJertson and determine if OV is still needed with GYN.   Spouse verbalizes understanding and is agreeable.   Routing to provider for final review. Patient is agreeable to disposition. Will close encounter.

## 2018-03-06 ENCOUNTER — Ambulatory Visit: Payer: BLUE CROSS/BLUE SHIELD | Admitting: Medical

## 2018-03-06 ENCOUNTER — Telehealth: Payer: Self-pay | Admitting: Obstetrics and Gynecology

## 2018-03-06 VITALS — BP 110/74 | HR 73 | Temp 97.8°F | Resp 16 | Ht 64.0 in | Wt 137.2 lb

## 2018-03-06 DIAGNOSIS — M549 Dorsalgia, unspecified: Secondary | ICD-10-CM | POA: Diagnosis not present

## 2018-03-06 DIAGNOSIS — M62838 Other muscle spasm: Secondary | ICD-10-CM | POA: Insufficient documentation

## 2018-03-06 DIAGNOSIS — M542 Cervicalgia: Secondary | ICD-10-CM | POA: Diagnosis not present

## 2018-03-06 DIAGNOSIS — R002 Palpitations: Secondary | ICD-10-CM | POA: Diagnosis not present

## 2018-03-06 DIAGNOSIS — R7989 Other specified abnormal findings of blood chemistry: Secondary | ICD-10-CM

## 2018-03-06 DIAGNOSIS — M25511 Pain in right shoulder: Secondary | ICD-10-CM

## 2018-03-06 DIAGNOSIS — G8929 Other chronic pain: Secondary | ICD-10-CM | POA: Insufficient documentation

## 2018-03-06 MED ORDER — CYCLOBENZAPRINE HCL 5 MG PO TABS: 5.0000 mg | ORAL_TABLET | Freq: Two times a day (BID) | ORAL | 1 refills | Status: DC | PRN

## 2018-03-06 MED ORDER — MELOXICAM 15 MG PO TABS: 15.0000 mg | ORAL_TABLET | Freq: Every day | ORAL | 2 refills | Status: DC

## 2018-03-06 NOTE — Patient Instructions (Signed)
Massage Therapy:  Becky Goodwin [facility] [address] [place], [place] [address] [phone] [email or web address]   

## 2018-03-06 NOTE — Telephone Encounter (Signed)
Patient's spouse called and scheduled his wife on 03/17/18 at 11:30 AM to see Dr. Oscar LaJertson for follow up. Routing to provider for FYI only. Okay to close encounter if no further action is needed.

## 2018-03-06 NOTE — Progress Notes (Signed)
Subjective: Chief Complaint  Patient presents with  . back shoulder hurting 3 days,  rapid heart beat    rapid heart rate at times, back and shoulder pain    Here with interpreter Lanna PocheBhumika Gautam from [employer]  For past 3 days having some pains in left shoulder and upper back, neck and head pain.   Started in upper back.  Took some OTC pain killer.   Husband massaged the area a little.  No other injury or trauma.  Has hx/o chronic shoulder pain, chronic neck pain.     Wants referral to cardiology.  Details unclear, but went to hospital somewhere recently to get "heart checked out."  They advised she come here, wasn't actually seen and evaluated.   She notes some palpitations occasionally in recent weeks.  No SOB, no DOE, no edema, no paresthesias.   No other aggravating or relieving factors. No other complaint.  Past Medical History:  Diagnosis Date  . Chronic neck pain   . Hyperlipidemia   . Hypertension   . Shoulder pain    Current Outpatient Medications on File Prior to Visit  Medication Sig Dispense Refill  . conjugated estrogens (PREMARIN) vaginal cream 1 gram vaginally twice weekly at bedtime. 42.5 g 1  . metroNIDAZOLE (FLAGYL) 500 MG tablet Take 1 tablet (500 mg total) by mouth 2 (two) times daily. 14 tablet 0  . betamethasone valerate ointment (VALISONE) 0.1 % Apply a pea sized amount topically BID for the next 1-2 weeks (Patient not taking: Reported on 03/06/2018) 15 g 0   No current facility-administered medications on file prior to visit.    ROS as in subjective     Objective: BP 110/74   Pulse 73   Temp 97.8 F (36.6 C) (Oral)   Resp 16   Ht 5\' 4"  (1.626 m)   Wt 137 lb 3.2 oz (62.2 kg)   LMP 10/06/2012   SpO2 98%   BMI 23.55 kg/m   Wt Readings from Last 3 Encounters:  03/06/18 137 lb 3.2 oz (62.2 kg)  02/20/18 137 lb 4 oz (62.3 kg)  02/03/18 138 lb (62.6 kg)    General appearance: alert, no distress, WD/WN,  neck: supple, mild right and  posterolateral right neck tenderness, mild pain with neck rotation and flexion to right,  no lymphadenopathy, no thyromegaly, no masses Heart: RRR, normal S1, S2, no murmurs Lungs: CTA bilaterally, no wheezes, rhonchi, or rales MSK: arms non tender, normal ROM Upper back tender paraspinal region and upper back spasm Pulses: 2+ symmetric, upper and lower extremities, normal cap refill      Assessment: Encounter Diagnoses  Name Primary?  . Palpitation Yes  . Chronic upper back pain   . Chronic neck pain   . Muscle spasm   . Chronic right shoulder pain   . Abnormal thyroid blood test      Plan: palpitations - referral to cardiology for further evaluation  chronic upper back pain, neck pain, shoulder pain, spasm - advised topical icy hot prn (OTC), medications below prn, advised she go get a massage once or twice monthly, discussed daily stretching routine.  Reviewed 07/2015 shoulder xray, 07/2014 shoulder and C spine xrays.  If not improving in the next 3 weeks with massage and recommendations below, consider referral to orthopedist.   Abnormal thyroid lab - was referred to endocrinology a few months ago.  I had spoke to endocrinology about her case.  It was felt that if echocardiogram was done and was normal,  then no other treatment or eval needed given asymptotic statis.Marland Kitchen However, insurance wouldn't cover echo.  Thus, we had referred to endocrinology.   She has not been able to see endocrinology due to issues with insurance, transportation, language barrier   Cena was seen today for back shoulder hurting 3 days,  rapid heart beat.  Diagnoses and all orders for this visit:  Palpitation -     Ambulatory referral to Cardiology  Chronic upper back pain  Chronic neck pain  Muscle spasm  Chronic right shoulder pain  Abnormal thyroid blood test  Other orders -     cyclobenzaprine (FLEXERIL) 5 MG tablet; Take 1 tablet (5 mg total) by mouth 2 (two) times daily as needed for  muscle spasms. -     meloxicam (MOBIC) 15 MG tablet; Take 1 tablet (15 mg total) by mouth daily.

## 2018-03-10 ENCOUNTER — Ambulatory Visit: Payer: BLUE CROSS/BLUE SHIELD | Admitting: Cardiology

## 2018-03-10 ENCOUNTER — Encounter: Payer: Self-pay | Admitting: Cardiology

## 2018-03-10 VITALS — BP 112/86 | HR 72 | Ht 64.0 in | Wt 137.0 lb

## 2018-03-10 DIAGNOSIS — R002 Palpitations: Secondary | ICD-10-CM | POA: Diagnosis not present

## 2018-03-10 NOTE — Patient Instructions (Signed)
Medication Instructions:  Your physician recommends that you continue on your current medications as directed. Please refer to the Current Medication list given to you today.   Labwork: None   Testing/Procedures: Your physician has requested that you have an echocardiogram. Echocardiography is a painless test that uses sound waves to create images of your heart. It provides your doctor with information about the size and shape of your heart and how well your heart's chambers and valves are working. This procedure takes approximately one hour. There are no restrictions for this procedure.    Follow-Up: As needed       If you need a refill on your cardiac medications before your next appointment, please call your pharmacy.   

## 2018-03-10 NOTE — Progress Notes (Signed)
Cardiology Office Note:    Date:  03/10/2018   ID:  Becky Goodwin, DOB Nov 24, 1965, MRN [id number]  PCP:  Jac Canavan, PA-C  Cardiologist:  No primary care provider on file.   Referring MD: Jac Canavan, PA-C     History of Present Illness:    Becky Goodwin is a 52 y.o. female here for the evaluation of palpitations at the request of Crosby Oyster, [place]  Review of his prior note on 03/06/2018- has been having 3 days of pain in the left shoulder upper back neck and head, took over-the-counter pain medication.  She requested a referral to cardiology but details were unclear, went to the hospital to get her "heart checked out ".  They advised that she was evaluated.  She had been experiencing rapid heartbeat.  Fast sometimes, normal. Just happens. Drinks black tea without milk. Green tea.  She states that her palpitations are actually getting better now. Anxiety during it, no CP, SOB. No syncope.  Perhaps she was having some minimal dizziness with these.  This is all per translator.  She does drink black tea but she has been drinking this her entire life.  No Sudafed.  Her mother died of MI 23  Past Medical History:  Diagnosis Date  . Chronic neck pain   . Hyperlipidemia   . Hypertension   . Shoulder pain     Past Surgical History:  Procedure Laterality Date  . BREAST LUMPECTOMY Right 2010  . CYST EXCISION     upper chest/ breast ([facility]), 1 week hospitalization  . JEJUNOSTOMY FEEDING TUBE     prior with surgery  . TONSILLECTOMY     Right tonsillectomy, 2wk hospitalization ([facility])    Current Medications: Current Meds  Medication Sig  . conjugated estrogens (PREMARIN) vaginal cream 1 gram vaginally twice weekly at bedtime.  . cyclobenzaprine (FLEXERIL) 5 MG tablet Take 1 tablet (5 mg total) by mouth 2 (two) times daily as needed for muscle spasms.     Allergies:   Hydrocodone   Social History   Socioeconomic History  . Marital status: Married    Spouse name: Not on  file  . Number of children: Not on file  . Years of education: Not on file  . Highest education level: Not on file  Occupational History  . Not on file  Social Needs  . Financial resource strain: Not on file  . Food insecurity:    Worry: Not on file    Inability: Not on file  . Transportation needs:    Medical: Not on file    Non-medical: Not on file  Tobacco Use  . Smoking status: Never Smoker  . Smokeless tobacco: Never Used  Substance and Sexual Activity  . Alcohol use: No    Alcohol/week: 0.0 oz  . Drug use: No  . Sexual activity: Not Currently    Partners: Male    Birth control/protection: None, Post-menopausal  Lifestyle  . Physical activity:    Days per week: Not on file    Minutes per session: Not on file  . Stress: Not on file  Relationships  . Social connections:    Talks on phone: Not on file    Gets together: Not on file    Attends religious service: Not on file    Active member of club or organization: Not on file    Attends meetings of clubs or organizations: Not on file    Relationship status: Not on file  Other Topics  Concern  . Not on file  Social History Narrative   Lives with husband, 3 children, ages 52yo, 52yo, 52yo, works in Designer, fashion/clothingtextiles, various jobs, some exercise with walking     Family History: The patient's family history includes Hypertension in her father. There is no history of Cancer, Diabetes, Heart disease, or Stroke.  ROS:   Please see the history of present illness.     All other systems reviewed and are negative.  EKGs/Labs/Other Studies Reviewed:    The following studies were reviewed today: Prior office notes, EKG, lab work  EKG:  EKG is  ordered today.  The ekg ordered today demonstrates 03/10/2018-sinus rhythm 72 with no other abnormalities personally reviewed  Recent Labs: 08/26/2017: ALT 13; BUN 7; Creatinine, Ser 0.76; Potassium 5.0; Sodium 145; TSH 0.277 02/03/2018: Hemoglobin 12.5; Platelets 272  Recent Lipid Panel      Component Value Date/Time   CHOL 163 01/08/2018 1232   TRIG 169 (H) 01/08/2018 1232   HDL 42 01/08/2018 1232   CHOLHDL 3.9 01/08/2018 1232   CHOLHDL 4.7 05/09/2017 0756   VLDL 50 (H) 12/13/2016 1447   LDLCALC 87 01/08/2018 1232   LDLCALC 116 (H) 05/09/2017 0756    Physical Exam:    VS:  BP 112/86   Pulse 72   Ht 5\' 4"  (1.626 m)   Wt 137 lb (62.1 kg)   LMP 10/06/2012   BMI 23.52 kg/m     Wt Readings from Last 3 Encounters:  03/10/18 137 lb (62.1 kg)  03/06/18 137 lb 3.2 oz (62.2 kg)  02/20/18 137 lb 4 oz (62.3 kg)     GEN:  Well nourished, well developed in no acute distress HEENT: Normal NECK: No JVD; No carotid bruits LYMPHATICS: No lymphadenopathy CARDIAC: RRR, no murmurs, rubs, gallops RESPIRATORY:  Clear to auscultation without rales, wheezing or rhonchi  ABDOMEN: Soft, non-tender, non-distended MUSCULOSKELETAL:  No edema; No deformity  SKIN: Warm and dry NEUROLOGIC:  Alert and oriented x 3 PSYCHIATRIC:  Normal affect   ASSESSMENT:    1. Palpitations    PLAN:    In order of problems listed above:  Palpitations - I will check ECHO to ensure normal function. Since they are improving, we will hold off of monitor.  She has been drinking black tea her entire life.  No other major changes.  No high risk associated symptoms.  She feels as though they are improving.  If her palpitations get worse, we can always order a monitor at that time.  She will let us know.  PRN follow-up visit   Medication Adjustments/Labs and Tests Ordered: Current medicines are reviewed at length with the patient today.  Concerns regarding medicines are outlined above.  Orders Placed This Encounter  Procedures  . EKG 12-Lead  . ECHOCARDIOGRAM COMPLETE   No orders of the defined types were placed in this encounter.   Patient Instructions  Medication Instructions:  Your physician recommends that you continue on your current medications as directed. Please refer to the Current  Medication list given to you today.   Labwork: None  Testing/Procedures: Your physician has requested that you have an echocardiogram. Echocardiography is a painless test that uses sound waves to create images of your heart. It provides your doctor with information about the size and shape of your heart and how well your heart's chambers and valves are working. This procedure takes approximately one hour. There are no restrictions for this procedure.  Follow-Up: As needed  If you need a  refill on your cardiac medications before your next appointment, please call your pharmacy.      Signed, Donato Schultz, MD  03/10/2018 2:41 PM    [facility]

## 2018-03-13 ENCOUNTER — Other Ambulatory Visit (HOSPITAL_COMMUNITY): Payer: BLUE CROSS/BLUE SHIELD

## 2018-03-16 NOTE — Progress Notes (Signed)
GYNECOLOGY  VISIT   HPI: 52 y.o.   Married  [place]  female   [phone] with Patient's last menstrual period was 10/06/2012.   here for follow up appointment. She has a h/o a grade 2 uterine prolapse and grade 2-3 cystocele. She was fitted with a #2 ring pessary with support in 5/19.  She c/o worsening vaginal discomfort. She stands for 10 hours a day at work, no lifting. She wants to go ahead with surgery.  She feels she is prolapsing around the pessary, but felt uncomfortable with the larger pessary.  Last month she was treated for BV, helped a little.  The pain is in the front part of her vagina and her lower back. It is a pressure pain. Normal BM 1-2 x a day. C/O feeling hot when voiding, just started. No urinary frequency or urgency, mild burning. She reports frequent GSI with valsalva, leaks a small amount. She is using her premarin cream 2 x a week.   GYNECOLOGIC HISTORY: Patient's last menstrual period was 10/06/2012. Contraception: Postmenopausal Menopausal hormone therapy: Premarin        OB History    Gravida  3   Para  3   Term  3   Preterm      AB      Living  3     SAB      TAB      Ectopic      Multiple      Live Births  3              Patient Active Problem List   Diagnosis Date Noted  . Muscle spasm 03/06/2018  . Chronic neck pain 03/06/2018  . Chronic upper back pain 03/06/2018  . Palpitation 03/06/2018  . Abnormal TSH 01/08/2018  . Female bladder prolapse 06/18/2017  . Dizziness 01/13/2017  . Hyperlipidemia 01/13/2017  . Weight gain 12/13/2016  . Edema 12/13/2016  . Pelvic pain in female 10/27/2015  . Chronic right shoulder pain 10/27/2015  . Encounter for health maintenance examination in adult 10/27/2015  . Special screening for malignant neoplasms, colon 10/27/2015  . Screening for breast cancer 10/27/2015    Past Medical History:  Diagnosis Date  . Chronic neck pain   . Hyperlipidemia   . Hypertension   . Shoulder pain      Past Surgical History:  Procedure Laterality Date  . BREAST LUMPECTOMY Right 2010  . CYST EXCISION     upper chest/ breast ([facility]), 1 week hospitalization  . JEJUNOSTOMY FEEDING TUBE     prior with surgery  . TONSILLECTOMY     Right tonsillectomy, 2wk hospitalization ([facility])    Current Outpatient Medications  Medication Sig Dispense Refill  . conjugated estrogens (PREMARIN) vaginal cream 1 gram vaginally twice weekly at bedtime. 42.5 g 1  . cyclobenzaprine (FLEXERIL) 5 MG tablet Take 1 tablet (5 mg total) by mouth 2 (two) times daily as needed for muscle spasms. 60 tablet 1   No current facility-administered medications for this visit.      ALLERGIES: Hydrocodone  Family History  Problem Relation Age of Onset  . Hypertension Father   . Cancer Neg Hx   . Diabetes Neg Hx   . Heart disease Neg Hx   . Stroke Neg Hx     Social History   Socioeconomic History  . Marital status: Married    Spouse name: Not on file  . Number of children: Not on file  . Years of education: Not  on file  . Highest education level: Not on file  Occupational History  . Not on file  Social Needs  . Financial resource strain: Not on file  . Food insecurity:    Worry: Not on file    Inability: Not on file  . Transportation needs:    Medical: Not on file    Non-medical: Not on file  Tobacco Use  . Smoking status: Never Smoker  . Smokeless tobacco: Never Used  Substance and Sexual Activity  . Alcohol use: No    Alcohol/week: 0.0 oz  . Drug use: No  . Sexual activity: Not Currently    Partners: Male    Birth control/protection: None, Post-menopausal  Lifestyle  . Physical activity:    Days per week: Not on file    Minutes per session: Not on file  . Stress: Not on file  Relationships  . Social connections:    Talks on phone: Not on file    Gets together: Not on file    Attends religious service: Not on file    Active member of club or organization: Not on file    Attends  meetings of clubs or organizations: Not on file    Relationship status: Not on file  . Intimate partner violence:    Fear of current or ex partner: Not on file    Emotionally abused: Not on file    Physically abused: Not on file    Forced sexual activity: Not on file  Other Topics Concern  . Not on file  Social History Narrative   Lives with husband, 3 children, ages 52yo, 69yo, 38yo, works in Designer, fashion/clothing, various jobs, some exercise with walking    Review of Systems  Constitutional: Negative.   Eyes: Negative.   Gastrointestinal: Negative.   Genitourinary: Positive for dysuria.       Vulvar/vaginal itching Vaginal discharge  Skin: Negative.   Neurological: Positive for headaches.  Endo/Heme/Allergies: Negative.   Psychiatric/Behavioral: Negative.     PHYSICAL EXAMINATION:    BP 108/60 (BP Location: Right Arm, Patient Position: Sitting, Cuff Size: Normal)   Pulse 74   Resp 12   Ht 5\' 4"  (1.626 m)   Wt 136 lb 6.4 oz (61.9 kg)   LMP 10/06/2012   BMI 23.41 kg/m     General appearance: alert, cooperative and appears stated age Abdomen: soft, mildly tender in the suprapubic region; non distended, no masses,  no organomegaly  Pelvic: External genitalia:  no lesions              Urethra:  normal appearing urethra with no masses, tenderness or lesions              Bartholins and Skenes: normal                 Vagina: pessary removed and cleaned. Atrophic appearing vagina with normal color and discharge, no lesions. Grade 2-3 cystocele with valsalva, grade 1-2 uterine prolapse, no significant rectocele.               Cervix: no lesions              Bimanual Exam:  Uterus:  normal size, contour, position, consistency, mobility, non-tender              Adnexa: no mass, fullness, tenderness   Bladder: tender to palpation               Chaperone was present for exam.  Urine dip: moderate WBC  ASSESSMENT Cystitis Vaginal burning GSI Cystocele, uterine prolapse      PLAN Treat UTI with Bactrim and pyridium Affirm sent Return for urodynamic testing Plan TVH/possible salpingectomies/cystocele repair/uterosacral ligament suspension/possible TVT (depending on urodynamics) and cystoscopy Reviewed the risks of bleeding, infection, damage to nearby organs, risk of recurrent prolapse and need for further surgery. Discussed that cystocele recurrence is up to 30%, but not all of those recurrences are symptomatic   An After Visit Summary was printed and given to the patient.  ~40 minutes face to face time of which over 50% was spent in counseling.   Patient was seen with her daughter and an interpreter present. All of her questions were answered.

## 2018-03-17 ENCOUNTER — Encounter: Payer: Self-pay | Admitting: Obstetrics and Gynecology

## 2018-03-17 ENCOUNTER — Ambulatory Visit: Payer: BLUE CROSS/BLUE SHIELD | Admitting: Obstetrics and Gynecology

## 2018-03-17 ENCOUNTER — Other Ambulatory Visit: Payer: Self-pay

## 2018-03-17 VITALS — BP 108/60 | HR 74 | Resp 12 | Ht 64.0 in | Wt 136.4 lb

## 2018-03-17 DIAGNOSIS — N8111 Cystocele, midline: Secondary | ICD-10-CM | POA: Diagnosis not present

## 2018-03-17 DIAGNOSIS — N3090 Cystitis, unspecified without hematuria: Secondary | ICD-10-CM | POA: Diagnosis not present

## 2018-03-17 DIAGNOSIS — N814 Uterovaginal prolapse, unspecified: Secondary | ICD-10-CM | POA: Diagnosis not present

## 2018-03-17 DIAGNOSIS — N760 Acute vaginitis: Secondary | ICD-10-CM | POA: Diagnosis not present

## 2018-03-17 DIAGNOSIS — N952 Postmenopausal atrophic vaginitis: Secondary | ICD-10-CM | POA: Diagnosis not present

## 2018-03-17 DIAGNOSIS — N393 Stress incontinence (female) (male): Secondary | ICD-10-CM

## 2018-03-17 LAB — POCT URINALYSIS DIPSTICK
BILIRUBIN UA: NEGATIVE
Blood, UA: NEGATIVE
Glucose, UA: NEGATIVE
KETONES UA: NEGATIVE
Nitrite, UA: NEGATIVE
PROTEIN UA: NEGATIVE
Urobilinogen, UA: 0.2 E.U./dL
pH, UA: 5 (ref 5.0–8.0)

## 2018-03-17 MED ORDER — PHENAZOPYRIDINE HCL 200 MG PO TABS: ORAL_TABLET | ORAL | 0 refills | Status: DC

## 2018-03-17 MED ORDER — SULFAMETHOXAZOLE-TRIMETHOPRIM 800-160 MG PO TABS: 1.0000 | ORAL_TABLET | Freq: Two times a day (BID) | ORAL | 0 refills | Status: DC

## 2018-03-18 ENCOUNTER — Encounter: Payer: Self-pay | Admitting: Obstetrics and Gynecology

## 2018-03-18 ENCOUNTER — Other Ambulatory Visit: Payer: Self-pay | Admitting: *Deleted

## 2018-03-18 LAB — URINALYSIS, MICROSCOPIC ONLY
BACTERIA UA: NONE SEEN
Casts: NONE SEEN /lpf

## 2018-03-18 LAB — VAGINITIS/VAGINOSIS, DNA PROBE
CANDIDA SPECIES: NEGATIVE
GARDNERELLA VAGINALIS: POSITIVE — AB
TRICHOMONAS VAG: NEGATIVE

## 2018-03-18 MED ORDER — METRONIDAZOLE 0.75 % VA GEL: 1.0000 | Freq: Every day | VAGINAL | 0 refills | Status: AC

## 2018-03-19 ENCOUNTER — Telehealth: Payer: Self-pay

## 2018-03-19 LAB — URINE CULTURE

## 2018-03-19 NOTE — Telephone Encounter (Signed)
Spoke with patient's daughter Milta DeitersChhatra, okay per ROI. Advised of results and message from Dr.Jertson. Daughter will notify patient of results. Encounter closed.

## 2018-03-19 NOTE — Telephone Encounter (Signed)
-----   Message from Romualdo BolkJill Evelyn Jertson, MD sent at 03/19/2018  2:15 PM EDT ----- Negative urine culture, she can stop the bactrim and pyridium, should continue with the metrogel

## 2018-04-06 ENCOUNTER — Other Ambulatory Visit: Payer: Self-pay | Admitting: Medical

## 2018-04-06 ENCOUNTER — Telehealth: Payer: Self-pay | Admitting: Obstetrics and Gynecology

## 2018-04-06 DIAGNOSIS — Z1231 Encounter for screening mammogram for malignant neoplasm of breast: Secondary | ICD-10-CM

## 2018-04-06 NOTE — Telephone Encounter (Signed)
Reviewed with Dr. Oscar LaJertson. Can schedule for 8/29 at 4:15pm.

## 2018-04-06 NOTE — Telephone Encounter (Signed)
Patient's spouse came into the office with questions about his wife. He said she finished her cream she used for five day but now she is having problems again. He'd like to speak with a nurse.

## 2018-04-06 NOTE — Telephone Encounter (Signed)
Spoke with patients spouse, OV scheduled for 8/29 at 4:15pm with Dr. Oscar LaJertson. Spouse verbalizes understanding.   Routing to provider for final review. Patient is agreeable to disposition. Will close encounter.

## 2018-04-06 NOTE — Telephone Encounter (Signed)
Spoke with patients spouse, Chhatra. Patient completed Metrogel x 5 days. Patient reports vagina "feels uneasy" and  Itching. Denies vaginal d/c, odor or urinary symptoms. Spouse requesting OV on 8/29 at 4:15pm. Plans to bring daughter to appt with patient.   Advised I will have to review schedule with Dr. Oscar LaJertson and return call. Spouse agreeable.

## 2018-04-09 ENCOUNTER — Encounter: Payer: Self-pay | Admitting: Obstetrics and Gynecology

## 2018-04-09 ENCOUNTER — Ambulatory Visit: Payer: BLUE CROSS/BLUE SHIELD | Admitting: Obstetrics and Gynecology

## 2018-04-09 VITALS — BP 100/55 | HR 88

## 2018-04-09 DIAGNOSIS — R300 Dysuria: Secondary | ICD-10-CM | POA: Diagnosis not present

## 2018-04-09 DIAGNOSIS — M545 Low back pain, unspecified: Secondary | ICD-10-CM

## 2018-04-09 DIAGNOSIS — N760 Acute vaginitis: Secondary | ICD-10-CM | POA: Diagnosis not present

## 2018-04-09 DIAGNOSIS — R102 Pelvic and perineal pain: Secondary | ICD-10-CM | POA: Diagnosis not present

## 2018-04-09 DIAGNOSIS — K5900 Constipation, unspecified: Secondary | ICD-10-CM

## 2018-04-09 LAB — POCT URINALYSIS DIPSTICK (MANUAL)
NITRITE UA: NEGATIVE
PH UA: 5 (ref 5.0–8.0)
POCT PROTEIN: NEGATIVE mg/dL
POCT UROBILINOGEN: NORMAL mg/dL
Poct Bilirubin: NEGATIVE
Poct Glucose: NORMAL mg/dL
Poct Ketones: NEGATIVE

## 2018-04-09 NOTE — Progress Notes (Signed)
GYNECOLOGY  VISIT   HPI: 52 y.o.   Married  Asian  female   [phone] with Patient's last menstrual period was 10/06/2012.   here for vaginal itching and pelvic pain.    She is having a pelvic heaviness, back pain. The pain is intermittent, not worse when she is on her feet. She is having burning vaginal discomfort, some vaginal itching. No discharge. It is uncomfortable to sit down. Voiding okay. Some constipation last few days. No urinary urgency or frequency. Hurts externally when she voids.  She has been treated for BV x 2 in the last few months.  She has GSI, no change.   GYNECOLOGIC HISTORY: Patient's last menstrual period was 10/06/2012. Contraception:postmenopausal Menopausal hormone therapy: premarin        OB History    Gravida  3   Para  3   Term  3   Preterm      AB      Living  3     SAB      TAB      Ectopic      Multiple      Live Births  3              Patient Active Problem List   Diagnosis Date Noted  . Muscle spasm 03/06/2018  . Chronic neck pain 03/06/2018  . Chronic upper back pain 03/06/2018  . Palpitation 03/06/2018  . Abnormal TSH 01/08/2018  . Female bladder prolapse 06/18/2017  . Dizziness 01/13/2017  . Hyperlipidemia 01/13/2017  . Weight gain 12/13/2016  . Edema 12/13/2016  . Pelvic pain in female 10/27/2015  . Chronic right shoulder pain 10/27/2015  . Encounter for health maintenance examination in adult 10/27/2015  . Special screening for malignant neoplasms, colon 10/27/2015  . Screening for breast cancer 10/27/2015    Past Medical History:  Diagnosis Date  . Chronic neck pain   . Hyperlipidemia   . Hypertension   . Shoulder pain     Past Surgical History:  Procedure Laterality Date  . BREAST LUMPECTOMY Right 2010  . CYST EXCISION     upper chest/ breast ([facility]), 1 week hospitalization  . JEJUNOSTOMY FEEDING TUBE     prior with surgery  . TONSILLECTOMY     Right tonsillectomy, 2wk hospitalization ([facility])     Current Outpatient Medications  Medication Sig Dispense Refill  . conjugated estrogens (PREMARIN) vaginal cream 1 gram vaginally twice weekly at bedtime. 42.5 g 1  . cyclobenzaprine (FLEXERIL) 5 MG tablet Take 1 tablet (5 mg total) by mouth 2 (two) times daily as needed for muscle spasms. 60 tablet 1  . meloxicam (MOBIC) 15 MG tablet Take 1 tablet by mouth as needed.  1   No current facility-administered medications for this visit.      ALLERGIES: Hydrocodone  Family History  Problem Relation Age of Onset  . Hypertension Father   . Cancer Neg Hx   . Diabetes Neg Hx   . Heart disease Neg Hx   . Stroke Neg Hx     Social History   Socioeconomic History  . Marital status: Married    Spouse name: Not on file  . Number of children: Not on file  . Years of education: Not on file  . Highest education level: Not on file  Occupational History  . Not on file  Social Needs  . Financial resource strain: Not on file  . Food insecurity:    Worry: Not on file  Inability: Not on file  . Transportation needs:    Medical: Not on file    Non-medical: Not on file  Tobacco Use  . Smoking status: Never Smoker  . Smokeless tobacco: Never Used  Substance and Sexual Activity  . Alcohol use: No    Alcohol/week: 0.0 standard drinks  . Drug use: No  . Sexual activity: Not Currently    Partners: Male    Birth control/protection: None, Post-menopausal  Lifestyle  . Physical activity:    Days per week: Not on file    Minutes per session: Not on file  . Stress: Not on file  Relationships  . Social connections:    Talks on phone: Not on file    Gets together: Not on file    Attends religious service: Not on file    Active member of club or organization: Not on file    Attends meetings of clubs or organizations: Not on file    Relationship status: Not on file  . Intimate partner violence:    Fear of current or ex partner: Not on file    Emotionally abused: Not on file     Physically abused: Not on file    Forced sexual activity: Not on file  Other Topics Concern  . Not on file  Social History Narrative   Lives with husband, 3 children, ages 33yo, 52yo, 24yo, works in Designer, fashion/clothing, various jobs, some exercise with walking    Review of Systems  Constitutional: Negative.   HENT: Negative.   Eyes: Negative.   Respiratory: Negative.   Cardiovascular: Negative.   Gastrointestinal: Negative.   Genitourinary:       Vaginal itching Pelvic pain  Musculoskeletal: Negative.   Skin: Negative.   Neurological: Negative.   Endo/Heme/Allergies: Negative.   Psychiatric/Behavioral: Negative.   All other systems reviewed and are negative.   PHYSICAL EXAMINATION:    BP (!) 100/55 (BP Location: Right Arm, Cuff Size: Small)   Pulse 88   LMP 10/06/2012     General appearance: alert, cooperative and appears stated age Lower back: tender to palpation Abdomen: soft, mildly tender mid lower abdomen, no rebound, no guarding, mildly distended, no masses,  no organomegaly  Pelvic: External genitalia:  no lesions              Urethra:  normal appearing urethra with no masses, tenderness or lesions              Bartholins and Skenes: normal                 Vagina: The patient bulges in front of the pessary with valsalva. The pessary was removed and cleaned. Mild vaginal erythema and atrophy, no focal irritation from the pessary              Cervix: no lesions and slightly friable              Bimanual Exam:  Uterus:  normal size, contour, position, consistency, mobility, non-tender              Adnexa: no mass, fullness, tenderness  Pessary replaced                Chaperone was present for exam.  Wet prep: ? clue, no trich, few wbc KOH: no yeast PH: 5   ASSESSMENT Cystocele and uterine prolapse, not enough help with the pessary, desires surgery Vaginitis symptoms, vaginal slides not clear, 2 recent episodes of BV Abdominal pelvic pain, suspect some pain  is from her  prolapse, some could be from constipation Lower back pain, tender to palpation    PLAN Affirm sent ccua for ua, c&s Discussed miralax for constipation The patient is supposed to come back for urodynamic studies prior to surgery (see last note) Information on back pain given    An After Visit Summary was printed and given to the patient.

## 2018-04-09 NOTE — Patient Instructions (Addendum)
Try miralax for constipation.   Continue to use the vaginal estrogen cream (premarin) 2 x a week  About Constipation  Constipation Overview Constipation is the most common gastrointestinal complaint - about 4 million Americans experience constipation and make 2.5 million physician visits a year to get help for the problem.  Constipation can occur when the colon absorbs too much water, the colon's muscle contraction is slow or sluggish, and/or there is delayed transit time through the colon.  The result is stool that is hard and dry.  Indicators of constipation include straining during bowel movements greater than 25% of the time, having fewer than three bowel movements per week, and/or the feeling of incomplete evacuation.  There are established guidelines ([place] II ) for defining constipation. A person needs to have two or more of the following symptoms for at least 12 weeks (not necessarily consecutive) in the preceding 12 months: . Straining in  greater than 25% of bowel movements . Lumpy or hard stools in greater than 25% of bowel movements . Sensation of incomplete emptying in greater than 25% of bowel movements . Sensation of anorectal obstruction/blockade in greater than 25% of bowel movements . Manual maneuvers to help empty greater than 25% of bowel movements (e.g., digital evacuation, support of the pelvic floor)  . Less than  3 bowel movements/week . Loose stools are not present, and criteria for irritable bowel syndrome are insufficient  Common Causes of Constipation . Lack of fiber in your diet . Lack of physical activity . Medications, including iron and calcium supplements  . Dairy intake . Dehydration . Abuse of laxatives  Travel  Irritable Bowel Syndrome  Pregnancy  Luteal phase of menstruation (after ovulation and before menses)  Colorectal problems  Intestinal Dysfunction  Treating Constipation  There are several ways of treating constipation, including  changes to diet and exercise, use of laxatives, adjustments to the pelvic floor, and scheduled toileting.  These treatments include: . increasing fiber and fluids in the diet  . increasing physical activity . learning muscle coordination   learning proper toileting techniques and toileting modifications   designing and sticking  to a toileting schedule     2007, [employer] Doc.22 Back Pain, Adult Many adults have back pain from time to time. Common causes of back pain include:  A strained muscle or ligament.  Wear and tear (degeneration) of the spinal disks.  Arthritis.  A hit to the back.  Back pain can be short-lived (acute) or last a long time (chronic). A physical exam, lab tests, and imaging studies may be done to find the cause of your pain. Follow these instructions at home: Managing pain and stiffness  Take over-the-counter and prescription medicines only as told by your health care provider.  If directed, apply heat to the affected area as often as told by your health care provider. Use the heat source that your health care provider recommends, such as a moist heat pack or a heating pad. ? Place a towel between your skin and the heat source. ? Leave the heat on for 20-30 minutes. ? Remove the heat if your skin turns bright red. This is especially important if you are unable to feel pain, heat, or cold. You have a greater risk of getting burned.  If directed, apply ice to the injured area: ? Put ice in a plastic bag. ? Place a towel between your skin and the bag. ? Leave the ice on for 20 minutes, 2-3 times a day  for the first 2-3 days. Activity  Do not stay in bed. Resting more than 1-2 days can delay your recovery.  Take short walks on even surfaces as soon as you are able. Try to increase the length of time you walk each day.  Do not sit, drive, or stand in one place for more than 30 minutes at a time. Sitting or standing for long periods of time  can put stress on your back.  Use proper lifting techniques. When you bend and lift, use positions that put less stress on your back: ? [place]end your knees. ? Keep the load close to your body. ? Avoid twisting.  Exercise regularly as told by your health care provider. Exercising will help your back heal faster. This also helps prevent back injuries by keeping muscles strong and flexible.  Your health care provider may recommend that you see a physical therapist. This person can help you come up with a safe exercise program. Do any exercises as told by your physical therapist. Lifestyle  Maintain a healthy weight. Extra weight puts stress on your back and makes it difficult to have good posture.  Avoid activities or situations that make you feel anxious or stressed. Learn ways to manage anxiety and stress. One way to manage stress is through exercise. Stress and anxiety increase muscle tension and can make back pain worse. General instructions  Sleep on a firm mattress in a comfortable position. Try lying on your side with your knees slightly bent. If you lie on your back, put a pillow under your knees.  Follow your treatment plan as told by your health care provider. This may include: ? Cognitive or behavioral therapy. ? Acupuncture or massage therapy. ? Meditation or yoga. Contact a health care provider if:  You have pain that is not relieved with rest or medicine.  You have increasing pain going down into your legs or buttocks.  Your pain does not improve in 2 weeks.  You have pain at night.  You lose weight.  You have a fever or chills. Get help right away if:  You develop new bowel or bladder control problems.  You have unusual weakness or numbness in your arms or legs.  You develop nausea or vomiting.  You develop abdominal pain.  You feel faint. Summary  Many adults have back pain from time to time. A physical exam, lab tests, and imaging studies may be done to find  the cause of your pain.  Use proper lifting techniques. When you bend and lift, use positions that put less stress on your back.  Take over-the-counter and prescription medicines and apply heat or ice as directed by your health care provider. This information is not intended to replace advice given to you by your health care provider. Make sure you discuss any questions you have with your health care provider. Document Released: 07/29/2005 Document Revised: 09/02/2016 Document Reviewed: 09/02/2016 Elsevier Interactive Patient Education  [employer]2018 Elsevier Inc.

## 2018-04-10 LAB — URINALYSIS, MICROSCOPIC ONLY

## 2018-04-11 LAB — VAGINITIS/VAGINOSIS, DNA PROBE
Candida Species: NEGATIVE
GARDNERELLA VAGINALIS: NEGATIVE
TRICHOMONAS VAG: NEGATIVE

## 2018-04-13 LAB — URINE CULTURE

## 2018-04-14 ENCOUNTER — Telehealth: Payer: Self-pay | Admitting: Emergency Medicine

## 2018-04-14 MED ORDER — NITROFURANTOIN MONOHYD MACRO 100 MG PO CAPS: 100.0000 mg | ORAL_CAPSULE | Freq: Two times a day (BID) | ORAL | 0 refills | Status: AC

## 2018-04-14 MED ORDER — PHENAZOPYRIDINE HCL 200 MG PO TABS: 200.0000 mg | ORAL_TABLET | Freq: Three times a day (TID) | ORAL | 0 refills | Status: AC

## 2018-04-14 NOTE — Telephone Encounter (Signed)
Call placed to patients daughter, [place], ok per dpr. Advised as seen below per Dr. Oscar La. Advised RX for Macrobid and pyridium have been sent to [facility]. Daughter verbalizes understanding and is agreeable. Encounter closed.

## 2018-04-14 NOTE — Telephone Encounter (Signed)
Patient's husband is returning a call to [place]. He is not on the DPR.

## 2018-04-14 NOTE — Telephone Encounter (Signed)
-----   Message from Romualdo Bolk, MD sent at 04/14/2018  7:38 AM EDT ----- The patient has a UTI, please treat with macrobid 100 mg BID x 5 days and pyridium 200 mg po TID for 2 days.  Her vaginitis panel was negative for infection.

## 2018-04-14 NOTE — Telephone Encounter (Signed)
Call to patient and detailed message left on voicemail listed on designated party release form.   Advised to call back with any questions or concerns or if not improved.

## 2018-04-24 ENCOUNTER — Ambulatory Visit: Payer: BLUE CROSS/BLUE SHIELD

## 2018-04-24 VITALS — BP 100/62 | HR 66 | Resp 14 | Ht 64.0 in | Wt 136.0 lb

## 2018-04-24 DIAGNOSIS — R300 Dysuria: Secondary | ICD-10-CM | POA: Diagnosis not present

## 2018-04-24 DIAGNOSIS — R102 Pelvic and perineal pain: Secondary | ICD-10-CM | POA: Diagnosis not present

## 2018-04-24 LAB — POCT URINALYSIS DIPSTICK
BILIRUBIN UA: NEGATIVE
GLUCOSE UA: NEGATIVE
Ketones, UA: NEGATIVE
Nitrite, UA: NEGATIVE
Protein, UA: NEGATIVE
RBC UA: NEGATIVE
Urobilinogen, UA: NEGATIVE E.U./dL — AB
pH, UA: 6 (ref 5.0–8.0)

## 2018-04-24 NOTE — Progress Notes (Signed)
Patient is here for a Pre Procedure  UA. Patient denies urinary symptoms. Results entered and sent to provider for review.

## 2018-04-27 ENCOUNTER — Telehealth: Payer: Self-pay | Admitting: Emergency Medicine

## 2018-04-27 ENCOUNTER — Ambulatory Visit (INDEPENDENT_AMBULATORY_CARE_PROVIDER_SITE_OTHER): Payer: BLUE CROSS/BLUE SHIELD | Admitting: Obstetrics and Gynecology

## 2018-04-27 VITALS — BP 92/60 | HR 64 | Resp 16 | Wt 133.0 lb

## 2018-04-27 DIAGNOSIS — R82998 Other abnormal findings in urine: Secondary | ICD-10-CM | POA: Diagnosis not present

## 2018-04-27 DIAGNOSIS — R32 Unspecified urinary incontinence: Secondary | ICD-10-CM

## 2018-04-27 LAB — POCT URINALYSIS DIPSTICK
Bilirubin, UA: NEGATIVE
Glucose, UA: NEGATIVE
Ketones, UA: NEGATIVE
LEUKOCYTES UA: NEGATIVE
NITRITE UA: NEGATIVE
PROTEIN UA: NEGATIVE
pH, UA: 6 (ref 5.0–8.0)

## 2018-04-27 NOTE — Progress Notes (Signed)
GYNECOLOGY  VISIT   HPI: 52 y.o.   Married Asian Not Hispanic or Latino  female   [phone] with Patient's last menstrual period was 10/06/2012.   The patient was seen last week for a pre-procedure urine dip. The dip returned with 2+ leukocytes. The patient was treated for a UTI earlier this month. She is currently without UTI symptoms. Still with occasional pelvic pain.   GYNECOLOGIC HISTORY: Patient's last menstrual period was 10/06/2012. Contraception:postmenopause  Menopausal hormone therapy: none         OB History    Gravida  3   Para  3   Term  3   Preterm      AB      Living  3     SAB      TAB      Ectopic      Multiple      Live Births  3              Patient Active Problem List   Diagnosis Date Noted  . Muscle spasm 03/06/2018  . Chronic neck pain 03/06/2018  . Chronic upper back pain 03/06/2018  . Palpitation 03/06/2018  . Abnormal TSH 01/08/2018  . Female bladder prolapse 06/18/2017  . Dizziness 01/13/2017  . Hyperlipidemia 01/13/2017  . Weight gain 12/13/2016  . Edema 12/13/2016  . Pelvic pain in female 10/27/2015  . Chronic right shoulder pain 10/27/2015  . Encounter for health maintenance examination in adult 10/27/2015  . Special screening for malignant neoplasms, colon 10/27/2015  . Screening for breast cancer 10/27/2015    Past Medical History:  Diagnosis Date  . Chronic neck pain   . Hyperlipidemia   . Hypertension   . Shoulder pain     Past Surgical History:  Procedure Laterality Date  . BREAST LUMPECTOMY Right 2010  . CYST EXCISION     upper chest/ breast ([facility]), 1 week hospitalization  . JEJUNOSTOMY FEEDING TUBE     prior with surgery  . TONSILLECTOMY     Right tonsillectomy, 2wk hospitalization ([facility])    Current Outpatient Medications  Medication Sig Dispense Refill  . conjugated estrogens (PREMARIN) vaginal cream 1 gram vaginally twice weekly at bedtime. 42.5 g 1  . cyclobenzaprine (FLEXERIL) 5 MG tablet Take 1  tablet (5 mg total) by mouth 2 (two) times daily as needed for muscle spasms. 60 tablet 1  . meloxicam (MOBIC) 15 MG tablet Take 1 tablet by mouth as needed.  1   No current facility-administered medications for this visit.      ALLERGIES: Hydrocodone  Family History  Problem Relation Age of Onset  . Hypertension Father   . Cancer Neg Hx   . Diabetes Neg Hx   . Heart disease Neg Hx   . Stroke Neg Hx     Social History   Socioeconomic History  . Marital status: Married    Spouse name: Not on file  . Number of children: Not on file  . Years of education: Not on file  . Highest education level: Not on file  Occupational History  . Not on file  Social Needs  . Financial resource strain: Not on file  . Food insecurity:    Worry: Not on file    Inability: Not on file  . Transportation needs:    Medical: Not on file    Non-medical: Not on file  Tobacco Use  . Smoking status: Never Smoker  . Smokeless tobacco: Never Used  Substance and  Sexual Activity  . Alcohol use: No    Alcohol/week: 0.0 standard drinks  . Drug use: No  . Sexual activity: Not Currently    Partners: Male    Birth control/protection: None, Post-menopausal  Lifestyle  . Physical activity:    Days per week: Not on file    Minutes per session: Not on file  . Stress: Not on file  Relationships  . Social connections:    Talks on phone: Not on file    Gets together: Not on file    Attends religious service: Not on file    Active member of club or organization: Not on file    Attends meetings of clubs or organizations: Not on file    Relationship status: Not on file  . Intimate partner violence:    Fear of current or ex partner: Not on file    Emotionally abused: Not on file    Physically abused: Not on file    Forced sexual activity: Not on file  Other Topics Concern  . Not on file  Social History Narrative   Lives with husband, 3 children, ages 27yo, 93yo, 49yo, works in Designer, fashion/clothing, various jobs,  some exercise with walking    Review of Systems  Constitutional: Negative.   HENT: Negative.   Eyes: Negative.   Respiratory: Negative.   Cardiovascular: Negative.   Gastrointestinal: Negative.   Endocrine: Negative.   Genitourinary: Positive for pelvic pain.  Musculoskeletal: Negative.   Skin: Negative.   Allergic/Immunologic: Negative.   Neurological: Negative.   Psychiatric/Behavioral: Negative.     PHYSICAL EXAMINATION:    BP 92/60 (BP Location: Right Arm, Patient Position: Sitting, Cuff Size: Normal)   Pulse 64   Resp 16   Wt 133 lb (60.3 kg)   LMP 10/06/2012   BMI 22.83 kg/m     General appearance: alert, cooperative and appears stated age  Pelvic: External genitalia:  no lesions              Urethra:  normal appearing urethra with no masses, tenderness or lesions              Bartholins and Skenes: normal     She was noted to be prolapsing some in front of the pessary                Straight cath ua was done Nigel Sloop, LPN attempted cath initially, unable to obtain because of the patient's cystocele). There was a small amount of blood noted at the urethral meatus.   Urine dip: trace blood, otherwise negative.   Chaperone was present for exam.  ASSESSMENT Patient with a h/o incontinence, due for urodynamics this week but treated for UTI earlier this month. Dip from Friday with 2+ leuk (suspect vaginal contamination)    PLAN St cath ua done, dip was negative (trace blood from the catheter) F/U for urodynamics as scheduled.    An After Visit Summary was printed and given to the patient.

## 2018-04-27 NOTE — Telephone Encounter (Signed)
Call to patients daughter per designated party release form.  Phone does not have voicemail set up.

## 2018-04-27 NOTE — Telephone Encounter (Signed)
-----   Message from Romualdo BolkJill Evelyn Jertson, MD sent at 04/26/2018  2:26 PM EDT ----- WBC on dip, did she have any UTI symptoms?

## 2018-04-27 NOTE — Telephone Encounter (Signed)
Reviewed with Dr. Oscar LaJertson.  Pt needs cath UA. If UA negative okay for urodynamics. If abnormal UA needs culture.   Spoke with daughter of paitient.  She states that patient does not have any current complaints.  Okay for today cath UA.  Encounter closed.

## 2018-04-29 ENCOUNTER — Encounter: Payer: Self-pay | Admitting: Obstetrics and Gynecology

## 2018-04-29 ENCOUNTER — Ambulatory Visit: Payer: BLUE CROSS/BLUE SHIELD | Admitting: Obstetrics and Gynecology

## 2018-04-29 VITALS — BP 110/62 | HR 68 | Resp 16 | Ht 64.0 in | Wt 133.6 lb

## 2018-04-29 DIAGNOSIS — R32 Unspecified urinary incontinence: Secondary | ICD-10-CM

## 2018-04-29 DIAGNOSIS — N814 Uterovaginal prolapse, unspecified: Secondary | ICD-10-CM

## 2018-04-29 DIAGNOSIS — N8111 Cystocele, midline: Secondary | ICD-10-CM

## 2018-04-29 DIAGNOSIS — R3911 Hesitancy of micturition: Secondary | ICD-10-CM

## 2018-04-29 NOTE — Patient Instructions (Signed)
After your procedure:   You may have a mild bladder and rectal discomfort for a few hours after the test. . You may experience some frequent urination and slight burning the first few times you urinate after the test. Rarely, the urine may be blood tinged. These are both due to catheter placements and resolve quickly.  . You should call our office immediately if you have signs of infection, which may include bladder pain, urinary urgency, fever, or burning during urination. .  We do encourage you to drink plenty of water after completion of the test today.   Please call our office with any concerns or questions.   [facility] [address], [address] [place], [place] [address] PH:  [phone]; Fax:  [phone] 

## 2018-04-29 NOTE — Progress Notes (Addendum)
Becky Goodwin is a 52 y.o. female Who presents today for urodynamics testing, ordered by Dr. Oscar LaJertson   Allergies and medications reviewed.  Denies complaints today. No urinary complaints.   Urine Micro exam: negative for WBC's or RBC's, okay to proceed per Dr. Oscar LaJertson    Patient reports urinary leakage with coughing, sneezing.  Urodynamics testing initiated. Lumax Bladder Catheter #10 [place] and lumax Abdominal Catheter #10 [place].   Post void residual 30 ml.   Urethral catheter placed without issue. Rectal catheter placed without issue.   Urodynamics testing completed. Please see scanned Patient summary report in Epic. Procedure completed and patient tolerated well without complaints. Patient scheduled for follow up office visit with Dr. Oscar LaJertson to discuss results. Patient agreeable.   Patient given post procedure instructions:  You may have a mild bladder and rectal discomfort for a few hours after the test. You may experience some frequent urination and slight burning the first few times you urinate after the test. Rarely, the urine may be blood tinged. These are both due to catheter placements and resolve quickly. You should call our office immediately if you have signs of infection, which may include bladder pain, urinary urgency, fever, or burning during urination. We do encourage you to drink plenty of water after the test.

## 2018-05-04 ENCOUNTER — Ambulatory Visit: Payer: BLUE CROSS/BLUE SHIELD | Admitting: Obstetrics and Gynecology

## 2018-05-04 ENCOUNTER — Telehealth: Payer: Self-pay | Admitting: Emergency Medicine

## 2018-05-04 NOTE — Progress Notes (Deleted)
GYNECOLOGY  VISIT   HPI: 52 y.o.   Married Asian Not Hispanic or Latino  female   [phone] with Patient's last menstrual period was 10/06/2012.   here for urodynamic results.     GYNECOLOGIC HISTORY: Patient's last menstrual period was 10/06/2012. Contraception:postmenopausal Menopausal hormone therapy: premarin        OB History    Gravida  3   Para  3   Term  3   Preterm      AB      Living  3     SAB      TAB      Ectopic      Multiple      Live Births  3              Patient Active Problem List   Diagnosis Date Noted  . Muscle spasm 03/06/2018  . Chronic neck pain 03/06/2018  . Chronic upper back pain 03/06/2018  . Palpitation 03/06/2018  . Abnormal TSH 01/08/2018  . Female bladder prolapse 06/18/2017  . Dizziness 01/13/2017  . Hyperlipidemia 01/13/2017  . Weight gain 12/13/2016  . Edema 12/13/2016  . Pelvic pain in female 10/27/2015  . Chronic right shoulder pain 10/27/2015  . Encounter for health maintenance examination in adult 10/27/2015  . Special screening for malignant neoplasms, colon 10/27/2015  . Screening for breast cancer 10/27/2015    Past Medical History:  Diagnosis Date  . Chronic neck pain   . Hyperlipidemia   . Hypertension   . Shoulder pain     Past Surgical History:  Procedure Laterality Date  . BREAST LUMPECTOMY Right 2010  . CYST EXCISION     upper chest/ breast ([facility]), 1 week hospitalization  . JEJUNOSTOMY FEEDING TUBE     prior with surgery  . TONSILLECTOMY     Right tonsillectomy, 2wk hospitalization ([facility])    Current Outpatient Medications  Medication Sig Dispense Refill  . conjugated estrogens (PREMARIN) vaginal cream 1 gram vaginally twice weekly at bedtime. 42.5 g 1  . cyclobenzaprine (FLEXERIL) 5 MG tablet Take 1 tablet (5 mg total) by mouth 2 (two) times daily as needed for muscle spasms. 60 tablet 1  . meloxicam (MOBIC) 15 MG tablet Take 1 tablet by mouth as needed.  1   No current  facility-administered medications for this visit.      ALLERGIES: Hydrocodone  Family History  Problem Relation Age of Onset  . Hypertension Father   . Cancer Neg Hx   . Diabetes Neg Hx   . Heart disease Neg Hx   . Stroke Neg Hx     Social History   Socioeconomic History  . Marital status: Married    Spouse name: Not on file  . Number of children: Not on file  . Years of education: Not on file  . Highest education level: Not on file  Occupational History  . Not on file  Social Needs  . Financial resource strain: Not on file  . Food insecurity:    Worry: Not on file    Inability: Not on file  . Transportation needs:    Medical: Not on file    Non-medical: Not on file  Tobacco Use  . Smoking status: Never Smoker  . Smokeless tobacco: Never Used  Substance and Sexual Activity  . Alcohol use: No    Alcohol/week: 0.0 standard drinks  . Drug use: No  . Sexual activity: Not Currently    Partners: Male    Birth  control/protection: None, Post-menopausal  Lifestyle  . Physical activity:    Days per week: Not on file    Minutes per session: Not on file  . Stress: Not on file  Relationships  . Social connections:    Talks on phone: Not on file    Gets together: Not on file    Attends religious service: Not on file    Active member of club or organization: Not on file    Attends meetings of clubs or organizations: Not on file    Relationship status: Not on file  . Intimate partner violence:    Fear of current or ex partner: Not on file    Emotionally abused: Not on file    Physically abused: Not on file    Forced sexual activity: Not on file  Other Topics Concern  . Not on file  Social History Narrative   Lives with husband, 3 children, ages 52yo, 52yo, 52yo, works in Designer, fashion/clothingtextiles, various jobs, some exercise with walking    Review of Systems  PHYSICAL EXAMINATION:    LMP 10/06/2012     General appearance: alert, cooperative and appears stated age Neck: no  adenopathy, supple, symmetrical, trachea midline and thyroid {CHL AMB PHY EX THYROID NORM [phone]::"normal to inspection and palpation"} Breasts: {Exam; breast:13139::"normal appearance, no masses or tenderness"} Abdomen: soft, non-tender; non distended, no masses,  no organomegaly  Pelvic: External genitalia:  no lesions              Urethra:  normal appearing urethra with no masses, tenderness or lesions              Bartholins and Skenes: normal                 Vagina: normal appearing vagina with normal color and discharge, no lesions              Cervix: {CHL AMB PHY EX CERVIX NORM [phone]::"no lesions"}              Bimanual Exam:  Uterus:  {CHL AMB PHY EX UTERUS NORM DEFAULT:[phone]::"normal size, contour, position, consistency, mobility, non-tender"}              Adnexa: {CHL AMB PHY EX ADNEXA NO MASS DEFAULT:[phone]::"no mass, fullness, tenderness"}              Rectovaginal: {yes no:[id number]}.  Confirms.              Anus:  normal sphincter tone, no lesions  Chaperone was present for exam.  ASSESSMENT     PLAN    An After Visit Summary was printed and given to the patient.  *** minutes face to face time of which over 50% was spent in counseling.

## 2018-05-04 NOTE — Telephone Encounter (Signed)
Call to patients duaghter to change appointment from today to tomorrow per Dr. Oscar LaJertson.  Encounter closed.

## 2018-05-05 ENCOUNTER — Other Ambulatory Visit: Payer: Self-pay

## 2018-05-05 ENCOUNTER — Encounter: Payer: Self-pay | Admitting: Obstetrics and Gynecology

## 2018-05-05 ENCOUNTER — Ambulatory Visit: Payer: BLUE CROSS/BLUE SHIELD | Admitting: Obstetrics and Gynecology

## 2018-05-05 ENCOUNTER — Ambulatory Visit: Payer: Self-pay | Admitting: Obstetrics and Gynecology

## 2018-05-05 VITALS — BP 104/60 | HR 80 | Resp 16 | Wt 133.0 lb

## 2018-05-05 DIAGNOSIS — N814 Uterovaginal prolapse, unspecified: Secondary | ICD-10-CM

## 2018-05-05 DIAGNOSIS — N3946 Mixed incontinence: Secondary | ICD-10-CM | POA: Diagnosis not present

## 2018-05-05 DIAGNOSIS — N8111 Cystocele, midline: Secondary | ICD-10-CM

## 2018-05-05 NOTE — Progress Notes (Signed)
GYNECOLOGY  VISIT   HPI: 52 y.o.   Married Asian Not Hispanic or Latino  female   [phone] with Patient's last menstrual period was 10/06/2012.   here to discuss urodynamic results.      Urodynamic results reviewed with the patient. She had clear GSI x 2 and one detrusor contraction. She did have slow urinary flow, c/w partial obstruction from her bladder prolapsing in front of the pessary.   She c/o leaking a small amount of urine with cough or sneezes, occurs a few x a week. She denies urge urinary incontinence. The patient desires surgery for correction of her prolapse. She has a grade 2 uterine prolapse and grade 2-3 cystocele, not enough help with the pessary. The larger pessary was uncomfortable.    GYNECOLOGIC HISTORY: Patient's last menstrual period was 10/06/2012. Contraception:postmenopause  Menopausal hormone therapy: Premarin         OB History    Gravida  3   Para  3   Term  3   Preterm      AB      Living  3     SAB      TAB      Ectopic      Multiple      Live Births  3              Patient Active Problem List   Diagnosis Date Noted  . Muscle spasm 03/06/2018  . Chronic neck pain 03/06/2018  . Chronic upper back pain 03/06/2018  . Palpitation 03/06/2018  . Abnormal TSH 01/08/2018  . Female bladder prolapse 06/18/2017  . Dizziness 01/13/2017  . Hyperlipidemia 01/13/2017  . Weight gain 12/13/2016  . Edema 12/13/2016  . Pelvic pain in female 10/27/2015  . Chronic right shoulder pain 10/27/2015  . Encounter for health maintenance examination in adult 10/27/2015  . Special screening for malignant neoplasms, colon 10/27/2015  . Screening for breast cancer 10/27/2015    Past Medical History:  Diagnosis Date  . Chronic neck pain   . Hyperlipidemia   . Hypertension   . Shoulder pain   H/O HTN, no current issue, not on medication  Past Surgical History:  Procedure Laterality Date  . BREAST LUMPECTOMY Right 2010  . CYST EXCISION      upper chest/ breast ([facility]), 1 week hospitalization  . JEJUNOSTOMY FEEDING TUBE     prior with surgery  . TONSILLECTOMY     Right tonsillectomy, 2wk hospitalization ([facility])    Current Outpatient Medications  Medication Sig Dispense Refill  . conjugated estrogens (PREMARIN) vaginal cream 1 gram vaginally twice weekly at bedtime. 42.5 g 1  . cyclobenzaprine (FLEXERIL) 5 MG tablet Take 1 tablet (5 mg total) by mouth 2 (two) times daily as needed for muscle spasms. 60 tablet 1  . meloxicam (MOBIC) 15 MG tablet Take 1 tablet by mouth as needed.  1   No current facility-administered medications for this visit.      ALLERGIES: Hydrocodone  Family History  Problem Relation Age of Onset  . Hypertension Father   . Cancer Neg Hx   . Diabetes Neg Hx   . Heart disease Neg Hx   . Stroke Neg Hx     Social History   Socioeconomic History  . Marital status: Married    Spouse name: Not on file  . Number of children: Not on file  . Years of education: Not on file  . Highest education level: Not on file  Occupational History  .  Not on file  Social Needs  . Financial resource strain: Not on file  . Food insecurity:    Worry: Not on file    Inability: Not on file  . Transportation needs:    Medical: Not on file    Non-medical: Not on file  Tobacco Use  . Smoking status: Never Smoker  . Smokeless tobacco: Never Used  Substance and Sexual Activity  . Alcohol use: No    Alcohol/week: 0.0 standard drinks  . Drug use: No  . Sexual activity: Not Currently    Partners: Male    Birth control/protection: None, Post-menopausal  Lifestyle  . Physical activity:    Days per week: Not on file    Minutes per session: Not on file  . Stress: Not on file  Relationships  . Social connections:    Talks on phone: Not on file    Gets together: Not on file    Attends religious service: Not on file    Active member of club or organization: Not on file    Attends meetings of clubs or  organizations: Not on file    Relationship status: Not on file  . Intimate partner violence:    Fear of current or ex partner: Not on file    Emotionally abused: Not on file    Physically abused: Not on file    Forced sexual activity: Not on file  Other Topics Concern  . Not on file  Social History Narrative   Lives with husband, 3 children, ages 86yo, 48yo, 31yo, works in Designer, fashion/clothing, various jobs, some exercise with walking    Review of Systems  Constitutional: Negative.   HENT: Negative.   Eyes: Negative.   Respiratory: Negative.   Cardiovascular: Negative.   Gastrointestinal: Negative.   Endocrine: Negative.   Genitourinary: Positive for vaginal pain.  Musculoskeletal: Negative.   Skin: Negative.   Allergic/Immunologic: Negative.   Neurological: Negative.   Psychiatric/Behavioral: Negative.     PHYSICAL EXAMINATION:    BP 104/60 (BP Location: Right Arm, Patient Position: Sitting, Cuff Size: Normal)   Pulse 80   Resp 16   Wt 133 lb (60.3 kg)   LMP 10/06/2012   BMI 22.83 kg/m     General appearance: alert, cooperative and appears stated age Neck: no adenopathy, supple, symmetrical, trachea midline and thyroid normal to inspection and palpation Heart: regular rate and rhythm Lungs: CTAB Abdomen: soft, non-tender; bowel sounds normal; no masses,  no organomegaly Extremities: normal, atraumatic, no cyanosis Skin: normal color, texture and turgor, no rashes or lesions Lymph: normal cervical supraclavicular and inguinal nodes Neurologic: grossly normal   ASSESSMENT Symptomatic cystocele and uterine prolapse Mixed urinary incontinence on urodynamic testing, patient only c/o stress incontinence Patient desires definitive surgery  PLAN Total vaginal hysterectomy, possible bilateral salpingectomies, cystocele repair, uterosacral ligament suspension, transvaginal mid urethral sling with TVT, cystoscopy Discussed risks, including but not limited to: bleeding, infection,  damage to nearby organs (bowel, bladder, vessels, ureters, urethra), risk of urinary retention, risk of mesh erosion, risk of recurrent prolapse and need for further surgery. All of her questions were answered.    An After Visit Summary was printed and given to the patient.  ~30 minutes face to face time of which over 90% was spent in counseling.   Addendum: On review of her chart the patient had a low TSH of 0.277 in 1/19, normal FT4. Primary was setting her up to see Endocrinology, no Endocrinology note or f/u labs found in Epic Will  need to see if she had f/u, if not she needs to return for lab work.

## 2018-05-05 NOTE — Patient Instructions (Signed)
Urinary Incontinence Urinary incontinence is the involuntary loss of urine from your bladder. What are the causes? There are many causes of urinary incontinence. They include:  Medicines.  Infections.  Prostatic enlargement, leading to overflow of urine from your bladder.  Surgery.  Neurological diseases.  Emotional factors.  What are the signs or symptoms? Urinary Incontinence can be divided into four types: 1. Urge incontinence. Urge incontinence is the involuntary loss of urine before you have the opportunity to go to the bathroom. There is a sudden urge to void but not enough time to reach a bathroom. 2. Stress incontinence. Stress incontinence is the sudden loss of urine with any activity that forces urine to pass. It is commonly caused by anatomical changes to the pelvis and sphincter areas of your body. 3. Overflow incontinence. Overflow incontinence is the loss of urine from an obstructed opening to your bladder. This results in a backup of urine and a resultant buildup of pressure within the bladder. When the pressure within the bladder exceeds the closing pressure of the sphincter, the urine overflows, which causes incontinence, similar to water overflowing a dam. 4. Total incontinence. Total incontinence is the loss of urine as a result of the inability to store urine within your bladder.  How is this diagnosed? Evaluating the cause of incontinence may require:  A thorough and complete medical and obstetric history.  A complete physical exam.  Laboratory tests such as a urine culture and sensitivities.  When additional tests are indicated, they can include:  An ultrasound exam.  Kidney and bladder X-rays.  Cystoscopy. This is an exam of the bladder using a narrow scope.  Urodynamic testing to test the nerve function to the bladder and sphincter areas.  How is this treated? Treatment for urinary incontinence depends on the cause:  For urge incontinence caused  by a bacterial infection, antibiotics will be prescribed. If the urge incontinence is related to medicines you take, your health care provider may have you change the medicine.  For stress incontinence, surgery to re-establish anatomical support to the bladder or sphincter, or both, will often correct the condition.  For overflow incontinence caused by an enlarged prostate, an operation to open the channel through the enlarged prostate will allow the flow of urine out of the bladder. In women with fibroids, a hysterectomy may be recommended.  For total incontinence, surgery on your urinary sphincter may help. An artificial urinary sphincter (an inflatable cuff placed around the urethra) may be required. In women who have developed a hole-like passage between their bladder and vagina (vesicovaginal fistula), surgery to close the fistula often is required.  Follow these instructions at home:  Normal daily hygiene and the use of pads or adult diapers that are changed regularly will help prevent odors and skin damage.  Avoid caffeine. It can overstimulate your bladder.  Use the bathroom regularly. Try about every 2-3 hours to go to the bathroom, even if you do not feel the need to do so. Take time to empty your bladder completely. After urinating, wait a minute. Then try to urinate again.  For causes involving nerve dysfunction, keep a log of the medicines you take and a journal of the times you go to the bathroom. Contact a health care provider if:  You experience worsening of pain instead of improvement in pain after your procedure.  Your incontinence becomes worse instead of better. Get help right away if:  You experience fever or shaking chills.  You are unable to   pass your urine.  You have redness spreading into your groin or down into your thighs. This information is not intended to replace advice given to you by your health care provider. Make sure you discuss any questions you have  with your health care provider. Document Released: 09/05/2004 Document Revised: 03/08/2016 Document Reviewed: 01/05/2013 Elsevier Interactive Patient Education  2018 [employer]  

## 2018-05-06 ENCOUNTER — Other Ambulatory Visit: Payer: Self-pay | Admitting: Obstetrics and Gynecology

## 2018-05-06 ENCOUNTER — Encounter: Payer: Self-pay | Admitting: Obstetrics and Gynecology

## 2018-05-06 ENCOUNTER — Telehealth: Payer: Self-pay | Admitting: Obstetrics and Gynecology

## 2018-05-06 DIAGNOSIS — R7989 Other specified abnormal findings of blood chemistry: Secondary | ICD-10-CM

## 2018-05-06 DIAGNOSIS — E0590 Thyrotoxicosis, unspecified without thyrotoxic crisis or storm: Secondary | ICD-10-CM

## 2018-05-06 DIAGNOSIS — Z01818 Encounter for other preprocedural examination: Secondary | ICD-10-CM

## 2018-05-06 NOTE — Telephone Encounter (Signed)
Patient's daughter [name] (ok per dpr) calling to speak with Kennon RoundsSally about surgery dates.

## 2018-05-06 NOTE — Progress Notes (Signed)
Urodynamic testing results were reviewed, documentation scanned into Epic. The patient had documented GSI x 2. She also had an episode of a detrusor contraction and spontaneous leakage.  Her max flow rate was 14 ml/second with is slow, c/w her partial obstruction from her prolapse. She was prolapsing in front of her pessary at the time of the testing (per Billie Ruddy, RN)

## 2018-05-06 NOTE — Telephone Encounter (Signed)
Return call to patient's daughter, Donnita Falls ( listed on DPR).  Wants to proceed with surgery on 05-12-18.  Per Dr Oscar La, thyroid labs needed prior to surgery. Will come in tomorrow for labs.  Surgery instruction sheet reviewed during office visit yesterday.

## 2018-05-07 ENCOUNTER — Encounter (HOSPITAL_BASED_OUTPATIENT_CLINIC_OR_DEPARTMENT_OTHER): Payer: Self-pay | Admitting: Obstetrics and Gynecology

## 2018-05-07 ENCOUNTER — Other Ambulatory Visit (INDEPENDENT_AMBULATORY_CARE_PROVIDER_SITE_OTHER): Payer: BLUE CROSS/BLUE SHIELD

## 2018-05-07 DIAGNOSIS — R7989 Other specified abnormal findings of blood chemistry: Secondary | ICD-10-CM

## 2018-05-07 DIAGNOSIS — Z01818 Encounter for other preprocedural examination: Secondary | ICD-10-CM

## 2018-05-07 DIAGNOSIS — E0590 Thyrotoxicosis, unspecified without thyrotoxic crisis or storm: Secondary | ICD-10-CM

## 2018-05-07 HISTORY — DX: Thyrotoxicosis, unspecified without thyrotoxic crisis or storm: E05.90

## 2018-05-07 LAB — COMPREHENSIVE METABOLIC PANEL
ALBUMIN: 4.7 g/dL (ref 3.5–5.5)
ALK PHOS: 69 IU/L (ref 39–117)
ALT: 11 IU/L (ref 0–32)
AST: 16 IU/L (ref 0–40)
Albumin/Globulin Ratio: 2.2 (ref 1.2–2.2)
BUN / CREAT RATIO: 15 (ref 9–23)
BUN: 11 mg/dL (ref 6–24)
Bilirubin Total: 0.5 mg/dL (ref 0.0–1.2)
CHLORIDE: 104 mmol/L (ref 96–106)
CO2: 25 mmol/L (ref 20–29)
Calcium: 9.7 mg/dL (ref 8.7–10.2)
Creatinine, Ser: 0.72 mg/dL (ref 0.57–1.00)
GFR calc Af Amer: 111 mL/min/{1.73_m2} (ref >59)
GFR calc non Af Amer: 97 mL/min/{1.73_m2} (ref >59)
GLOBULIN, TOTAL: 2.1 g/dL (ref 1.5–4.5)
GLUCOSE: 107 mg/dL — AB (ref 65–99)
Potassium: 4.1 mmol/L (ref 3.5–5.2)
Sodium: 144 mmol/L (ref 134–144)
Total Protein: 6.8 g/dL (ref 6.0–8.5)

## 2018-05-07 LAB — THYROID PANEL WITH TSH
Free Thyroxine Index: 2.9 (ref 1.2–4.9)
T3 Uptake Ratio: 29 % (ref 24–39)
T4 TOTAL: 9.9 ug/dL (ref 4.5–12.0)
TSH: 0.062 u[IU]/mL — AB (ref 0.450–4.500)

## 2018-05-07 LAB — CBC
Hematocrit: 38.5 % (ref 34.0–46.6)
Hemoglobin: 13.1 g/dL (ref 11.1–15.9)
MCH: 30.3 pg (ref 26.6–33.0)
MCHC: 34 g/dL (ref 31.5–35.7)
MCV: 89 fL (ref 79–97)
PLATELETS: 250 10*3/uL (ref 150–450)
RBC: 4.33 x10E6/uL (ref 3.77–5.28)
RDW: 13 % (ref 12.3–15.4)
WBC: 4.5 10*3/uL (ref 3.4–10.8)

## 2018-05-07 NOTE — H&P (Signed)
GYNECOLOGY  VISIT   HPI: 52 y.o.   Married Asian Not Hispanic or Latino  female   [phone] with Patient's last menstrual period was 10/06/2012.   here to discuss urodynamic results.      Urodynamic results reviewed with the patient. She had clear GSI x 2 and one detrusor contraction. She did have slow urinary flow, c/w partial obstruction from her bladder prolapsing in front of the pessary.   She c/o leaking a small amount of urine with cough or sneezes, occurs a few x a week. She denies urge urinary incontinence. The patient desires surgery for correction of her prolapse. She has a grade 2 uterine prolapse and grade 2-3 cystocele, not enough help with the pessary. The larger pessary was uncomfortable.    GYNECOLOGIC HISTORY: Patient's last menstrual period was 10/06/2012. Contraception:postmenopause  Menopausal hormone therapy: Premarin                 OB History    Gravida  3   Para  3   Term  3   Preterm      AB      Living  3     SAB      TAB      Ectopic      Multiple      Live Births  3                  Patient Active Problem List   Diagnosis Date Noted  . Muscle spasm 03/06/2018  . Chronic neck pain 03/06/2018  . Chronic upper back pain 03/06/2018  . Palpitation 03/06/2018  . Abnormal TSH 01/08/2018  . Female bladder prolapse 06/18/2017  . Dizziness 01/13/2017  . Hyperlipidemia 01/13/2017  . Weight gain 12/13/2016  . Edema 12/13/2016  . Pelvic pain in female 10/27/2015  . Chronic right shoulder pain 10/27/2015  . Encounter for health maintenance examination in adult 10/27/2015  . Special screening for malignant neoplasms, colon 10/27/2015  . Screening for breast cancer 10/27/2015        Past Medical History:  Diagnosis Date  . Chronic neck pain   . Hyperlipidemia   . Hypertension   . Shoulder pain   H/O HTN, no current issue, not on medication       Past Surgical History:  Procedure Laterality Date  . BREAST  LUMPECTOMY Right 2010  . CYST EXCISION     upper chest/ breast ([facility]), 1 week hospitalization  . JEJUNOSTOMY FEEDING TUBE     prior with surgery  . TONSILLECTOMY     Right tonsillectomy, 2wk hospitalization ([facility])          Current Outpatient Medications  Medication Sig Dispense Refill  . conjugated estrogens (PREMARIN) vaginal cream 1 gram vaginally twice weekly at bedtime. 42.5 g 1  . cyclobenzaprine (FLEXERIL) 5 MG tablet Take 1 tablet (5 mg total) by mouth 2 (two) times daily as needed for muscle spasms. 60 tablet 1  . meloxicam (MOBIC) 15 MG tablet Take 1 tablet by mouth as needed.  1   No current facility-administered medications for this visit.      ALLERGIES: Hydrocodone       Family History  Problem Relation Age of Onset  . Hypertension Father   . Cancer Neg Hx   . Diabetes Neg Hx   . Heart disease Neg Hx   . Stroke Neg Hx     Social History        Socioeconomic History  . Marital  status: Married    Spouse name: Not on file  . Number of children: Not on file  . Years of education: Not on file  . Highest education level: Not on file  Occupational History  . Not on file  Social Needs  . Financial resource strain: Not on file  . Food insecurity:    Worry: Not on file    Inability: Not on file  . Transportation needs:    Medical: Not on file    Non-medical: Not on file  Tobacco Use  . Smoking status: Never Smoker  . Smokeless tobacco: Never Used  Substance and Sexual Activity  . Alcohol use: No    Alcohol/week: 0.0 standard drinks  . Drug use: No  . Sexual activity: Not Currently    Partners: Male    Birth control/protection: None, Post-menopausal  Lifestyle  . Physical activity:    Days per week: Not on file    Minutes per session: Not on file  . Stress: Not on file  Relationships  . Social connections:    Talks on phone: Not on file    Gets together: Not on file    Attends religious service:  Not on file    Active member of club or organization: Not on file    Attends meetings of clubs or organizations: Not on file    Relationship status: Not on file  . Intimate partner violence:    Fear of current or ex partner: Not on file    Emotionally abused: Not on file    Physically abused: Not on file    Forced sexual activity: Not on file  Other Topics Concern  . Not on file  Social History Narrative   Lives with husband, 3 children, ages 81yo, 41yo, 68yo, works in Designer, fashion/clothing, various jobs, some exercise with walking    Review of Systems  Constitutional: Negative.   HENT: Negative.   Eyes: Negative.   Respiratory: Negative.   Cardiovascular: Negative.   Gastrointestinal: Negative.   Endocrine: Negative.   Genitourinary: Positive for vaginal pain.  Musculoskeletal: Negative.   Skin: Negative.   Allergic/Immunologic: Negative.   Neurological: Negative.   Psychiatric/Behavioral: Negative.     PHYSICAL EXAMINATION:    BP 104/60 (BP Location: Right Arm, Patient Position: Sitting, Cuff Size: Normal)   Pulse 80   Resp 16   Wt 133 lb (60.3 kg)   LMP 10/06/2012   BMI 22.83 kg/m     General appearance: alert, cooperative and appears stated age Neck: no adenopathy, supple, symmetrical, trachea midline and thyroid normal to inspection and palpation Heart: regular rate and rhythm Lungs: CTAB Abdomen: soft, non-tender; bowel sounds normal; no masses,  no organomegaly Extremities: normal, atraumatic, no cyanosis Skin: normal color, texture and turgor, no rashes or lesions Lymph: normal cervical supraclavicular and inguinal nodes Neurologic: grossly normal   ASSESSMENT Symptomatic cystocele and uterine prolapse Mixed urinary incontinence on urodynamic testing, patient only c/o stress incontinence Patient desires definitive surgery  PLAN Total vaginal hysterectomy, possible bilateral salpingectomies, cystocele repair, uterosacral ligament suspension,  transvaginal mid urethral sling with TVT, cystoscopy Discussed risks, including but not limited to: bleeding, infection, damage to nearby organs (bowel, bladder, vessels, ureters, urethra), risk of urinary retention, risk of mesh erosion, risk of recurrent prolapse and need for further surgery. All of her questions were answered.    An After Visit Summary was printed and given to the patient.  ~30 minutes face to face time of which over 90% was spent in  counseling.   Addendum: On review of her chart the patient had a low TSH of 0.277 in 1/19, normal FT4. Primary was setting her up to see Endocrinology, no Endocrinology note or f/u labs found in Epic Will need to see if she had f/u, if not she needs to return for lab work.    Addendum: TFT's, patient with subclinical hypothyroidism. Safe to proceed with surgery, Anesthesia needs to be aware.

## 2018-05-07 NOTE — Progress Notes (Addendum)
03-10-18 (Epic) EKG  05-07-18 (Epic) CBC, CMP, Thyroid Panel  Spoke to Dr. Renold Don about Dr. Salli Quarry concern about the pt's diagnosis of Subclinical Hyperthyroid. Dr. Oscar La is questioning if anesthesia would deem it necessary for patient to receive a betablocker. Per Dr. Renold Don, this diagnosis doesn't require treatment.

## 2018-05-08 ENCOUNTER — Encounter (HOSPITAL_COMMUNITY)
Admission: RE | Admit: 2018-05-08 | Discharge: 2018-05-08 | Disposition: A | Discharge status: Home / Self Care | Payer: BLUE CROSS/BLUE SHIELD | Source: Ambulatory Visit | Attending: Obstetrics and Gynecology | Admitting: Obstetrics and Gynecology

## 2018-05-08 ENCOUNTER — Encounter (HOSPITAL_COMMUNITY): Payer: Self-pay

## 2018-05-08 ENCOUNTER — Other Ambulatory Visit: Payer: Self-pay

## 2018-05-08 DIAGNOSIS — Z01818 Encounter for other preprocedural examination: Secondary | ICD-10-CM | POA: Insufficient documentation

## 2018-05-08 DIAGNOSIS — N819 Female genital prolapse, unspecified: Secondary | ICD-10-CM | POA: Insufficient documentation

## 2018-05-08 DIAGNOSIS — R32 Unspecified urinary incontinence: Secondary | ICD-10-CM | POA: Insufficient documentation

## 2018-05-08 NOTE — Patient Instructions (Signed)
Becky Goodwin  05/08/2018      Your procedure is scheduled on 05-12-18   Report to [facility]  at 6:30  A.M.  Call this number if you have problems the morning of [phone]  OUR ADDRESS IS [address], WE ARE LOCATED [facility] THE [facility] WITH [facility].   Remember:  Do not eat food or drink liquids after midnight.  Take these medicines the morning of surgery with A SIP OF WATER: None    Do not wear jewelry, make-up or nail polish.  Do not wear lotions, powders, or perfumes, or deoderant.  Do not shave 48 hours prior to surgery.    Do not bring valuables to the hospital.  [facility] is not responsible for any belongings or valuables.  Contacts, dentures or bridgework may not be worn into surgery.  Leave your suitcase in the car.  After surgery it may be brought to your room.  For patients admitted to the hospital, discharge time will be determined by your treatment team.  Patients discharged the day of surgery will not be allowed to drive home.   Special instructions:    Please read over the following fact sheets that you were given:       [facility] - Preparing for Surgery Before surgery, you can play an important role.  Because skin is not sterile, your skin needs to be as free of germs as possible.  You can reduce the number of germs on your skin by washing with CHG (chlorahexidine gluconate) soap before surgery.  CHG is an antiseptic cleaner which kills germs and bonds with the skin to continue killing germs even after washing. Please DO NOT use if you have an allergy to CHG or antibacterial soaps.  If your skin becomes reddened/irritated stop using the CHG and inform your nurse when you arrive at Short Stay. Do not shave (including legs and underarms) for at least 48 hours prior to the first CHG shower.  You may shave your face/neck. Please follow these instructions carefully:  1.  Shower with CHG Soap the night before surgery and  the  morning of Surgery.  2.  If you choose to wash your hair, wash your hair first as usual with your  normal  shampoo.  3.  After you shampoo, rinse your hair and body thoroughly to remove the  shampoo.                           4.  Use CHG as you would any other liquid soap.  You can apply chg directly  to the skin and wash                       Gently with a scrungie or clean washcloth.  5.  Apply the CHG Soap to your body ONLY FROM THE NECK DOWN.   Do not use on face/ open                           Wound or open sores. Avoid contact with eyes, ears mouth and genitals (private parts).                       Wash face,  Genitals (private parts) with your normal soap.             6.  Wash  thoroughly, paying special attention to the area where your surgery  will be performed.  7.  Thoroughly rinse your body with warm water from the neck down.  8.  DO NOT shower/wash with your normal soap after using and rinsing off  the CHG Soap.                9.  Pat yourself dry with a clean towel.            10.  Wear clean pajamas.            11.  Place clean sheets on your bed the night of your first shower and do not  sleep with pets. Day of Surgery : Do not apply any lotions/deodorants the morning of surgery.  Please wear clean clothes to the hospital/surgery center.  FAILURE TO FOLLOW THESE INSTRUCTIONS MAY RESULT IN THE CANCELLATION OF YOUR SURGERY PATIENT SIGNATURE_________________________________  NURSE SIGNATURE__________________________________  ________________________________________________________________________

## 2018-05-08 NOTE — Telephone Encounter (Signed)
Call to patient's daughter, Donnita [place]. Advised of lab results and recommendation for referral to endocrinology. See result note from Dr Oscar La.  Advised of surgery time change to 0730. Arrive at 0530.

## 2018-05-11 ENCOUNTER — Ambulatory Visit
Admission: RE | Admit: 2018-05-11 | Discharge: 2018-05-11 | Disposition: A | Discharge status: Home / Self Care | Payer: BLUE CROSS/BLUE SHIELD | Source: Ambulatory Visit | Attending: Medical | Admitting: Medical

## 2018-05-11 DIAGNOSIS — Z1231 Encounter for screening mammogram for malignant neoplasm of breast: Secondary | ICD-10-CM | POA: Diagnosis not present

## 2018-05-11 NOTE — Progress Notes (Signed)
Language resources called to confirm interpreter for tomorrow for patient from [phone] am and 1030-1230.

## 2018-05-12 ENCOUNTER — Other Ambulatory Visit: Payer: Self-pay

## 2018-05-12 ENCOUNTER — Ambulatory Visit (HOSPITAL_BASED_OUTPATIENT_CLINIC_OR_DEPARTMENT_OTHER): Payer: BLUE CROSS/BLUE SHIELD | Admitting: Certified Registered Nurse Anesthetist

## 2018-05-12 ENCOUNTER — Encounter (HOSPITAL_COMMUNITY)
Admission: RE | Disposition: A | Discharge status: Home / Self Care | Payer: Self-pay | Source: Other Acute Inpatient Hospital | Attending: Obstetrics and Gynecology

## 2018-05-12 ENCOUNTER — Encounter (HOSPITAL_BASED_OUTPATIENT_CLINIC_OR_DEPARTMENT_OTHER): Payer: Self-pay | Admitting: Emergency Medicine

## 2018-05-12 ENCOUNTER — Ambulatory Visit (HOSPITAL_BASED_OUTPATIENT_CLINIC_OR_DEPARTMENT_OTHER)
Admission: RE | Admit: 2018-05-12 | Discharge: 2018-05-13 | Disposition: A | Discharge status: Home / Self Care | Payer: BLUE CROSS/BLUE SHIELD | Source: Other Acute Inpatient Hospital | Attending: Obstetrics and Gynecology | Admitting: Obstetrics and Gynecology

## 2018-05-12 DIAGNOSIS — I10 Essential (primary) hypertension: Secondary | ICD-10-CM | POA: Insufficient documentation

## 2018-05-12 DIAGNOSIS — Z9889 Other specified postprocedural states: Secondary | ICD-10-CM | POA: Diagnosis present

## 2018-05-12 DIAGNOSIS — E0590 Thyrotoxicosis, unspecified without thyrotoxic crisis or storm: Secondary | ICD-10-CM

## 2018-05-12 DIAGNOSIS — N736 Female pelvic peritoneal adhesions (postinfective): Secondary | ICD-10-CM | POA: Insufficient documentation

## 2018-05-12 DIAGNOSIS — Z79899 Other long term (current) drug therapy: Secondary | ICD-10-CM | POA: Insufficient documentation

## 2018-05-12 DIAGNOSIS — N8111 Cystocele, midline: Secondary | ICD-10-CM | POA: Diagnosis not present

## 2018-05-12 DIAGNOSIS — N814 Uterovaginal prolapse, unspecified: Secondary | ICD-10-CM | POA: Insufficient documentation

## 2018-05-12 DIAGNOSIS — N959 Unspecified menopausal and perimenopausal disorder: Secondary | ICD-10-CM | POA: Insufficient documentation

## 2018-05-12 DIAGNOSIS — Z885 Allergy status to narcotic agent status: Secondary | ICD-10-CM | POA: Diagnosis not present

## 2018-05-12 DIAGNOSIS — E039 Hypothyroidism, unspecified: Secondary | ICD-10-CM | POA: Insufficient documentation

## 2018-05-12 DIAGNOSIS — N72 Inflammatory disease of cervix uteri: Secondary | ICD-10-CM | POA: Insufficient documentation

## 2018-05-12 DIAGNOSIS — N393 Stress incontinence (female) (male): Secondary | ICD-10-CM | POA: Insufficient documentation

## 2018-05-12 DIAGNOSIS — D649 Anemia, unspecified: Secondary | ICD-10-CM | POA: Insufficient documentation

## 2018-05-12 DIAGNOSIS — N888 Other specified noninflammatory disorders of cervix uteri: Secondary | ICD-10-CM | POA: Insufficient documentation

## 2018-05-12 DIAGNOSIS — E785 Hyperlipidemia, unspecified: Secondary | ICD-10-CM | POA: Diagnosis not present

## 2018-05-12 HISTORY — PX: LAPAROSCOPIC VAGINAL HYSTERECTOMY WITH SALPINGECTOMY: SHX6680

## 2018-05-12 HISTORY — DX: Thyrotoxicosis, unspecified without thyrotoxic crisis or storm: E05.90

## 2018-05-12 HISTORY — PX: CYSTOSCOPY: SHX5120

## 2018-05-12 HISTORY — PX: BLADDER SUSPENSION: SHX72

## 2018-05-12 HISTORY — PX: BILATERAL SALPINGECTOMY: SHX5743

## 2018-05-12 HISTORY — PX: VAGINAL HYSTERECTOMY: SHX2639

## 2018-05-12 HISTORY — PX: CYSTOCELE REPAIR: SHX163

## 2018-05-12 LAB — CBC
HCT: 22.7 % — ABNORMAL LOW (ref 36.0–46.0)
HEMATOCRIT: 22.9 % — AB (ref 36.0–46.0)
HEMOGLOBIN: 7.7 g/dL — AB (ref 12.0–15.0)
Hemoglobin: 7.7 g/dL — ABNORMAL LOW (ref 12.0–15.0)
MCH: 30.3 pg (ref 26.0–34.0)
MCH: 30.6 pg (ref 26.0–34.0)
MCHC: 33.6 g/dL (ref 30.0–36.0)
MCHC: 33.9 g/dL (ref 30.0–36.0)
MCV: 90.2 fL (ref 78.0–100.0)
MCV: 90.1 fL (ref 78.0–100.0)
PLATELETS: 175 10*3/uL (ref 150–400)
Platelets: 163 10*3/uL (ref 150–400)
RBC: 2.54 MIL/uL — AB (ref 3.87–5.11)
RBC: 2.52 MIL/uL — AB (ref 3.87–5.11)
RDW: 12.3 % (ref 11.5–15.5)
RDW: 12.4 % (ref 11.5–15.5)
WBC: 9 10*3/uL (ref 4.0–10.5)
WBC: 8.3 10*3/uL (ref 4.0–10.5)

## 2018-05-12 LAB — PREPARE RBC (CROSSMATCH)

## 2018-05-12 LAB — ABO/RH: ABO/RH(D): A POS

## 2018-05-12 SURGERY — HYSTERECTOMY, VAGINAL
Anesthesia: General | Site: Vagina

## 2018-05-12 MED ORDER — OXYCODONE-ACETAMINOPHEN 5-325 MG PO TABS
2.0000 | ORAL_TABLET | ORAL | Status: DC | PRN
  Filled 2018-05-12: qty 2

## 2018-05-12 MED ORDER — PROMETHAZINE HCL 25 MG/ML IJ SOLN
6.2500 mg | INTRAMUSCULAR | Status: DC | PRN
  Administered 2018-05-12: 6.25 mg via INTRAVENOUS
  Filled 2018-05-12: qty 1

## 2018-05-12 MED ORDER — GABAPENTIN 300 MG PO CAPS
300.0000 mg | ORAL_CAPSULE | Freq: Once | ORAL | Status: AC
  Administered 2018-05-12: 300 mg via ORAL
  Filled 2018-05-12: qty 1

## 2018-05-12 MED ORDER — TRAMADOL HCL 50 MG PO TABS
50.0000 mg | ORAL_TABLET | Freq: Four times a day (QID) | ORAL | Status: DC | PRN
  Administered 2018-05-13: 50 mg via ORAL
  Filled 2018-05-12 (×2): qty 1

## 2018-05-12 MED ORDER — FENTANYL CITRATE (PF) 250 MCG/5ML IJ SOLN
INTRAMUSCULAR | Status: AC
  Filled 2018-05-12: qty 5

## 2018-05-12 MED ORDER — SCOPOLAMINE 1 MG/3DAYS TD PT72
1.0000 | MEDICATED_PATCH | Freq: Once | TRANSDERMAL | Status: DC
  Administered 2018-05-12: 1.5 mg via TRANSDERMAL
  Filled 2018-05-12: qty 1

## 2018-05-12 MED ORDER — PHENYLEPHRINE HCL 10 MG/ML IJ SOLN
INTRAMUSCULAR | Status: AC
  Filled 2018-05-12: qty 1

## 2018-05-12 MED ORDER — DOCUSATE SODIUM 100 MG PO CAPS
100.0000 mg | ORAL_CAPSULE | Freq: Two times a day (BID) | ORAL | Status: DC
  Filled 2018-05-12: qty 1

## 2018-05-12 MED ORDER — ENOXAPARIN SODIUM 40 MG/0.4ML ~~LOC~~ SOLN
40.0000 mg | SUBCUTANEOUS | Status: DC
  Filled 2018-05-12: qty 0.4

## 2018-05-12 MED ORDER — PROPOFOL 10 MG/ML IV BOLUS
INTRAVENOUS | Status: DC | PRN
  Administered 2018-05-12: 130 mg via INTRAVENOUS

## 2018-05-12 MED ORDER — MENTHOL 3 MG MT LOZG
1.0000 | LOZENGE | OROMUCOSAL | Status: DC | PRN
  Filled 2018-05-12: qty 9

## 2018-05-12 MED ORDER — ACETAMINOPHEN 500 MG PO TABS
1000.0000 mg | ORAL_TABLET | Freq: Once | ORAL | Status: AC
  Administered 2018-05-12: 1000 mg via ORAL
  Filled 2018-05-12: qty 2

## 2018-05-12 MED ORDER — ZOLPIDEM TARTRATE 5 MG PO TABS
5.0000 mg | ORAL_TABLET | Freq: Every evening | ORAL | Status: DC | PRN
  Filled 2018-05-12: qty 1

## 2018-05-12 MED ORDER — LACTATED RINGERS IV SOLN
INTRAVENOUS | Status: DC
  Filled 2018-05-12: qty 1000

## 2018-05-12 MED ORDER — PHENYLEPHRINE 40 MCG/ML (10ML) SYRINGE FOR IV PUSH (FOR BLOOD PRESSURE SUPPORT)
PREFILLED_SYRINGE | INTRAVENOUS | Status: DC | PRN
  Administered 2018-05-12 (×8): 80 ug via INTRAVENOUS
  Administered 2018-05-12: 120 ug via INTRAVENOUS

## 2018-05-12 MED ORDER — DEXAMETHASONE SODIUM PHOSPHATE 10 MG/ML IJ SOLN
INTRAMUSCULAR | Status: DC | PRN
  Administered 2018-05-12: 10 mg via INTRAVENOUS

## 2018-05-12 MED ORDER — CEFAZOLIN SODIUM-DEXTROSE 2-4 GM/100ML-% IV SOLN
INTRAVENOUS | Status: AC
  Filled 2018-05-12: qty 100

## 2018-05-12 MED ORDER — DIPHENHYDRAMINE HCL 50 MG/ML IJ SOLN
INTRAMUSCULAR | Status: DC | PRN
  Administered 2018-05-12: 12.5 mg via INTRAVENOUS

## 2018-05-12 MED ORDER — ACETAMINOPHEN 325 MG PO TABS
650.0000 mg | ORAL_TABLET | ORAL | Status: DC | PRN
  Administered 2018-05-13: 650 mg via ORAL
  Filled 2018-05-12 (×2): qty 2

## 2018-05-12 MED ORDER — ONDANSETRON HCL 4 MG/2ML IJ SOLN
INTRAMUSCULAR | Status: AC
  Filled 2018-05-12: qty 2

## 2018-05-12 MED ORDER — DEXAMETHASONE SODIUM PHOSPHATE 10 MG/ML IJ SOLN
INTRAMUSCULAR | Status: AC
  Filled 2018-05-12: qty 1

## 2018-05-12 MED ORDER — DIPHENHYDRAMINE HCL 50 MG/ML IJ SOLN
INTRAMUSCULAR | Status: AC
  Filled 2018-05-12: qty 1

## 2018-05-12 MED ORDER — SODIUM CHLORIDE 0.9 % IJ SOLN
INTRAMUSCULAR | Status: DC | PRN
  Administered 2018-05-12: 5 mL

## 2018-05-12 MED ORDER — KETOROLAC TROMETHAMINE 30 MG/ML IJ SOLN
30.0000 mg | Freq: Four times a day (QID) | INTRAMUSCULAR | Status: DC
  Filled 2018-05-12: qty 1

## 2018-05-12 MED ORDER — PHENYLEPHRINE 40 MCG/ML (10ML) SYRINGE FOR IV PUSH (FOR BLOOD PRESSURE SUPPORT)
PREFILLED_SYRINGE | INTRAVENOUS | Status: AC
  Filled 2018-05-12: qty 10

## 2018-05-12 MED ORDER — LIDOCAINE HCL (CARDIAC) PF 100 MG/5ML IV SOSY
PREFILLED_SYRINGE | INTRAVENOUS | Status: DC | PRN
  Administered 2018-05-12: 60 mg via INTRAVENOUS

## 2018-05-12 MED ORDER — SODIUM CHLORIDE 0.9 % IR SOLN
Status: DC | PRN
  Administered 2018-05-12: 3000 mL

## 2018-05-12 MED ORDER — FENTANYL CITRATE (PF) 100 MCG/2ML IJ SOLN
INTRAMUSCULAR | Status: AC
  Filled 2018-05-12: qty 2

## 2018-05-12 MED ORDER — ACETAMINOPHEN 500 MG PO TABS
ORAL_TABLET | ORAL | Status: AC
  Filled 2018-05-12: qty 2

## 2018-05-12 MED ORDER — KETOROLAC TROMETHAMINE 30 MG/ML IJ SOLN
30.0000 mg | Freq: Four times a day (QID) | INTRAMUSCULAR | Status: DC
  Administered 2018-05-13 (×3): 30 mg via INTRAVENOUS
  Filled 2018-05-12 (×4): qty 1

## 2018-05-12 MED ORDER — MIDAZOLAM HCL 2 MG/2ML IJ SOLN
INTRAMUSCULAR | Status: DC | PRN
  Administered 2018-05-12: 2 mg via INTRAVENOUS

## 2018-05-12 MED ORDER — BUPIVACAINE-EPINEPHRINE 0.25% -1:200000 IJ SOLN
INTRAMUSCULAR | Status: DC | PRN
  Administered 2018-05-12: 20 mL

## 2018-05-12 MED ORDER — EPHEDRINE SULFATE-NACL 50-0.9 MG/10ML-% IV SOSY
PREFILLED_SYRINGE | INTRAVENOUS | Status: DC | PRN
  Administered 2018-05-12 (×6): 5 mg via INTRAVENOUS

## 2018-05-12 MED ORDER — ROCURONIUM BROMIDE 100 MG/10ML IV SOLN
INTRAVENOUS | Status: DC | PRN
  Administered 2018-05-12: 20 mg via INTRAVENOUS
  Administered 2018-05-12: 10 mg via INTRAVENOUS
  Administered 2018-05-12 (×3): 20 mg via INTRAVENOUS
  Administered 2018-05-12: 40 mg via INTRAVENOUS

## 2018-05-12 MED ORDER — LACTATED RINGERS IV SOLN
INTRAVENOUS | Status: DC
  Administered 2018-05-12 (×5): via INTRAVENOUS
  Filled 2018-05-12: qty 1000

## 2018-05-12 MED ORDER — PROPOFOL 10 MG/ML IV BOLUS
INTRAVENOUS | Status: AC
  Filled 2018-05-12: qty 20

## 2018-05-12 MED ORDER — CEFAZOLIN SODIUM-DEXTROSE 2-4 GM/100ML-% IV SOLN
2.0000 g | INTRAVENOUS | Status: AC
  Administered 2018-05-12 (×2): 2 g via INTRAVENOUS
  Filled 2018-05-12: qty 100

## 2018-05-12 MED ORDER — ACETAMINOPHEN 325 MG PO TABS
650.0000 mg | ORAL_TABLET | Freq: Once | ORAL | Status: DC
  Filled 2018-05-12: qty 2

## 2018-05-12 MED ORDER — HYDROMORPHONE HCL 1 MG/ML IJ SOLN
0.2000 mg | INTRAMUSCULAR | Status: DC | PRN
  Administered 2018-05-12: 0.3 mg via INTRAVENOUS
  Filled 2018-05-12 (×2): qty 1

## 2018-05-12 MED ORDER — LIDOCAINE 2% (20 MG/ML) 5 ML SYRINGE
INTRAMUSCULAR | Status: AC
  Filled 2018-05-12: qty 5

## 2018-05-12 MED ORDER — DIPHENHYDRAMINE HCL 50 MG/ML IJ SOLN
25.0000 mg | Freq: Once | INTRAMUSCULAR | Status: DC
  Filled 2018-05-12: qty 0.5

## 2018-05-12 MED ORDER — GABAPENTIN 300 MG PO CAPS
ORAL_CAPSULE | ORAL | Status: AC
  Filled 2018-05-12: qty 1

## 2018-05-12 MED ORDER — SODIUM CHLORIDE 0.9% IV SOLUTION
Freq: Once | INTRAVENOUS | Status: AC
  Administered 2018-05-12: 19:00:00 via INTRAVENOUS
  Filled 2018-05-12: qty 250

## 2018-05-12 MED ORDER — SIMETHICONE 80 MG PO CHEW
80.0000 mg | CHEWABLE_TABLET | Freq: Four times a day (QID) | ORAL | Status: DC | PRN
  Filled 2018-05-12: qty 1

## 2018-05-12 MED ORDER — ENOXAPARIN SODIUM 40 MG/0.4ML ~~LOC~~ SOLN
40.0000 mg | SUBCUTANEOUS | Status: AC
  Administered 2018-05-12: 40 mg via SUBCUTANEOUS
  Filled 2018-05-12: qty 0.4

## 2018-05-12 MED ORDER — PROMETHAZINE HCL 25 MG/ML IJ SOLN
INTRAMUSCULAR | Status: AC
  Filled 2018-05-12: qty 1

## 2018-05-12 MED ORDER — FENTANYL CITRATE (PF) 100 MCG/2ML IJ SOLN
25.0000 ug | INTRAMUSCULAR | Status: DC | PRN
  Administered 2018-05-12: 25 ug via INTRAVENOUS
  Filled 2018-05-12: qty 1

## 2018-05-12 MED ORDER — CEFAZOLIN SODIUM 1 G IJ SOLR
INTRAMUSCULAR | Status: AC
  Filled 2018-05-12: qty 20

## 2018-05-12 MED ORDER — BUPIVACAINE HCL (PF) 0.25 % IJ SOLN
INTRAMUSCULAR | Status: DC | PRN
  Administered 2018-05-12: 10 mL

## 2018-05-12 MED ORDER — EPHEDRINE 5 MG/ML INJ
INTRAVENOUS | Status: AC
  Filled 2018-05-12: qty 10

## 2018-05-12 MED ORDER — KCL IN DEXTROSE-NACL 20-5-0.45 MEQ/L-%-% IV SOLN
INTRAVENOUS | Status: DC
  Administered 2018-05-13: 04:00:00 via INTRAVENOUS
  Filled 2018-05-12 (×3): qty 1000

## 2018-05-12 MED ORDER — ROCURONIUM BROMIDE 10 MG/ML (PF) SYRINGE
PREFILLED_SYRINGE | INTRAVENOUS | Status: AC
  Filled 2018-05-12: qty 10

## 2018-05-12 MED ORDER — SUGAMMADEX SODIUM 200 MG/2ML IV SOLN
INTRAVENOUS | Status: DC | PRN
  Administered 2018-05-12: 120 mg via INTRAVENOUS

## 2018-05-12 MED ORDER — ENOXAPARIN SODIUM 40 MG/0.4ML ~~LOC~~ SOLN
SUBCUTANEOUS | Status: AC
  Filled 2018-05-12: qty 0.4

## 2018-05-12 MED ORDER — ONDANSETRON HCL 4 MG/2ML IJ SOLN
INTRAMUSCULAR | Status: DC | PRN
  Administered 2018-05-12: 4 mg via INTRAVENOUS

## 2018-05-12 MED ORDER — MIDAZOLAM HCL 2 MG/2ML IJ SOLN
INTRAMUSCULAR | Status: AC
  Filled 2018-05-12: qty 2

## 2018-05-12 MED ORDER — SCOPOLAMINE 1 MG/3DAYS TD PT72
MEDICATED_PATCH | TRANSDERMAL | Status: AC
  Filled 2018-05-12: qty 1

## 2018-05-12 MED ORDER — SODIUM CHLORIDE 0.9 % IJ SOLN
INTRAMUSCULAR | Status: AC
  Filled 2018-05-12: qty 10

## 2018-05-12 MED ORDER — ONDANSETRON HCL 4 MG PO TABS
4.0000 mg | ORAL_TABLET | Freq: Four times a day (QID) | ORAL | Status: DC | PRN
  Filled 2018-05-12: qty 1

## 2018-05-12 MED ORDER — FENTANYL CITRATE (PF) 250 MCG/5ML IJ SOLN
INTRAMUSCULAR | Status: DC | PRN
  Administered 2018-05-12 (×3): 50 ug via INTRAVENOUS
  Administered 2018-05-12: 100 ug via INTRAVENOUS
  Administered 2018-05-12 (×2): 50 ug via INTRAVENOUS

## 2018-05-12 MED ORDER — SODIUM CHLORIDE 0.9 % IV SOLN
INTRAVENOUS | Status: DC | PRN
  Administered 2018-05-12: 40 ug/min via INTRAVENOUS

## 2018-05-12 MED ORDER — SUGAMMADEX SODIUM 200 MG/2ML IV SOLN
INTRAVENOUS | Status: AC
  Filled 2018-05-12: qty 2

## 2018-05-12 MED ORDER — ALBUMIN HUMAN 5 % IV SOLN
INTRAVENOUS | Status: AC
  Filled 2018-05-12: qty 250

## 2018-05-12 MED ORDER — ONDANSETRON HCL 4 MG/2ML IJ SOLN
4.0000 mg | Freq: Four times a day (QID) | INTRAMUSCULAR | Status: DC | PRN
  Filled 2018-05-12: qty 2

## 2018-05-12 SURGICAL SUPPLY — 49 items
APPLICATOR ARISTA FLEXITIP XL (MISCELLANEOUS) ×5 IMPLANT
CANISTER SUCT 3000ML PPV (MISCELLANEOUS) ×15 IMPLANT
CATH SILASTIC FOLEY 18FRX30CC (CATHETERS) ×5 IMPLANT
DECANTER SPIKE VIAL GLASS SM (MISCELLANEOUS) IMPLANT
DERMABOND ADVANCED (GAUZE/BANDAGES/DRESSINGS) ×4
DERMABOND ADVANCED .7 DNX12 (GAUZE/BANDAGES/DRESSINGS) ×6 IMPLANT
DRAPE POUCH INSTRU U-SHP 10X18 (DRAPES) ×5 IMPLANT
DRAPE STERI URO 9X17 APER PCH (DRAPES) ×5 IMPLANT
FLOSEAL 5ML (HEMOSTASIS) ×5 IMPLANT
GAUZE SPONGE 4X4 16PLY XRAY LF (GAUZE/BANDAGES/DRESSINGS) ×5 IMPLANT
GLOVE BIO SURGEON STRL SZ 6.5 (GLOVE) ×32 IMPLANT
GLOVE BIO SURGEONS STRL SZ 6.5 (GLOVE) ×8
GLOVE BIOGEL PI IND STRL 7.0 (GLOVE) ×15 IMPLANT
GLOVE BIOGEL PI INDICATOR 7.0 (GLOVE) ×10
GLOVE ECLIPSE 6.5 STRL STRAW (GLOVE) ×25 IMPLANT
GOWN STRL REUS W/TWL LRG LVL3 (GOWN DISPOSABLE) ×55 IMPLANT
HARMONIC RUM II 3.0CM SILVER (DISPOSABLE) ×5
IV NS IRRIG 3000ML ARTHROMATIC (IV SOLUTION) ×5 IMPLANT
LIGASURE 5MM LAPAROSCOPIC (INSTRUMENTS) ×5 IMPLANT
LIGASURE IMPACT 36 18CM CVD LR (INSTRUMENTS) IMPLANT
NEEDLE HYPO 22GX1.5 SAFETY (NEEDLE) ×5 IMPLANT
NEEDLE MAYO 6 CRC TAPER PT (NEEDLE) ×5 IMPLANT
NEEDLE SPNL 25GX3.5 QUINCKE BL (NEEDLE) ×5 IMPLANT
NS IRRIG 1000ML POUR BTL (IV SOLUTION) ×5 IMPLANT
PACK LAPAROSCOPY BASIN (CUSTOM PROCEDURE TRAY) ×5 IMPLANT
PACK VAGINAL WOMENS (CUSTOM PROCEDURE TRAY) ×5 IMPLANT
PAD OB MATERNITY 4.3X12.25 (PERSONAL CARE ITEMS) ×10 IMPLANT
SCALPEL HRMNC RUM II 3.0 SILVR (DISPOSABLE) ×3 IMPLANT
SET IRRIG TUBING LAPAROSCOPIC (IRRIGATION / IRRIGATOR) ×5 IMPLANT
SET TRI-LUMEN FLTR TB AIRSEAL (TUBING) ×10 IMPLANT
SHEARS HARMONIC 9CM CVD (BLADE) ×5 IMPLANT
SLING TVT EXACT (Sling) ×5 IMPLANT
SPONGE SURGIFOAM ABS GEL 100 (HEMOSTASIS) ×5 IMPLANT
SPONGE SURGIFOAM ABS GEL 12-7 (HEMOSTASIS) IMPLANT
SUT PDS AB 0 CT 36 (SUTURE) ×20 IMPLANT
SUT PDS AB 0 CT1 36 (SUTURE) IMPLANT
SUT VIC AB 0 CT1 18XCR BRD8 (SUTURE) ×9 IMPLANT
SUT VIC AB 0 CT1 27 (SUTURE) ×6
SUT VIC AB 0 CT1 27XBRD ANBCTR (SUTURE) ×9 IMPLANT
SUT VIC AB 0 CT1 36 (SUTURE) ×10 IMPLANT
SUT VIC AB 0 CT1 8-18 (SUTURE) ×6
SUT VIC AB 2-0 SH 27 (SUTURE) ×2
SUT VIC AB 2-0 SH 27XBRD (SUTURE) ×3 IMPLANT
SUT VIC AB 4-0 PS2 27 (SUTURE) ×5 IMPLANT
SUT VICRYL 0 TIES 12 18 (SUTURE) ×5 IMPLANT
TOWEL OR 17X24 6PK STRL BLUE (TOWEL DISPOSABLE) ×15 IMPLANT
TRAY FOLEY W/BAG SLVR 14FR (SET/KITS/TRAYS/PACK) ×10 IMPLANT
TROCAR BLADELESS OPT 5 100 (ENDOMECHANICALS) ×5 IMPLANT
gelflow/41-2058-01 ×5 IMPLANT

## 2018-05-12 NOTE — Interval H&P Note (Signed)
History and Physical Interval Note:  05/12/2018 7:14 AM  Becky Goodwin  has presented today for surgery, with the diagnosis of uterine prolapse, cystocele, SUI  The various methods of treatment have been discussed with the patient and family. After consideration of risks, benefits and other options for treatment, the patient has consented to  Procedure(s) with comments: HYSTERECTOMY VAGINAL (N/A) - TVH/BS/anterior repair/ uterosacral ligament suspension/TVT/ cysto/ 3 hours/ extended recovery bed BILATERAL SALPINGECTOMY (Bilateral) ANTERIOR REPAIR (CYSTOCELE) with uterosacral ligament suspension (N/A) TRANSVAGINAL TAPE (TVT) PROCEDURE (N/A) CYSTOSCOPY (N/A) as a surgical intervention .  The patient's history has been reviewed, patient examined, no change in status, stable for surgery.  I have reviewed the patient's chart and labs.  Questions were answered to the patient's satisfaction.     Romualdo Bolk

## 2018-05-12 NOTE — Transfer of Care (Signed)
Immediate Anesthesia Transfer of Care Note  Patient: Becky Goodwin  Procedure(s) Performed: ATTEMPTED HYSTERECTOMY VAGINAL (N/A Vagina ) BILATERAL SALPINGECTOMY (Bilateral Abdomen) ANTERIOR REPAIR (CYSTOCELE) with uterosacral ligament suspension (N/A Vagina ) TRANSVAGINAL TAPE (TVT) PROCEDURE (N/A Vagina ) CYSTOSCOPY (N/A ) LAPAROSCOPIC ASSISTED VAGINAL HYSTERECTOMY WITH BILATERAL  SALPINGOOPHRECTOMY AND EXTENSIVE LYSIS OF ADHESIONS (Bilateral Abdomen)  Patient Location: PACU  Anesthesia Type:General  Level of Consciousness: awake, alert , oriented, drowsy and patient cooperative  Airway & Oxygen Therapy: Patient Spontanous Breathing and Patient connected to nasal cannula oxygen  Post-op Assessment: Report given to RN and Post -op Vital signs reviewed and stable  Post vital signs: Reviewed and stable  Last Vitals:  Vitals Value Taken Time  BP 97/66 05/12/2018  1:39 PM  Temp    Pulse 122 05/12/2018  1:41 PM  Resp 18 05/12/2018  1:41 PM  SpO2 100 % 05/12/2018  1:41 PM  Vitals shown include unvalidated device data.  Last Pain:  Vitals:   05/12/18 0532  TempSrc: Oral  PainSc: 0-No pain         Complications: No apparent anesthesia complications

## 2018-05-12 NOTE — Anesthesia Preprocedure Evaluation (Addendum)
Anesthesia Evaluation  Patient identified by MRN, date of birth, ID band Patient awake    Reviewed: Allergy & Precautions, NPO status , Patient's Chart, lab work & pertinent test results  Airway Mallampati: I  TM Distance: >3 FB Neck ROM: Full    Dental  (+) Teeth Intact, Dental Advisory Given   Pulmonary neg pulmonary ROS,    Pulmonary exam normal breath sounds clear to auscultation       Cardiovascular hypertension, Normal cardiovascular exam Rhythm:Regular Rate:Normal     Neuro/Psych negative neurological ROS     GI/Hepatic negative GI ROS, Neg liver ROS,   Endo/Other  Hyperthyroidism   Renal/GU negative Renal ROS   uterine prolapse, cystocele, SUI    Musculoskeletal negative musculoskeletal ROS (+)   Abdominal   Peds  Hematology negative hematology ROS (+)   Anesthesia Other Findings Day of surgery medications reviewed with the patient.  Reproductive/Obstetrics                            Anesthesia Physical Anesthesia Plan  ASA: II  Anesthesia Plan: General   Post-op Pain Management:    Induction: Intravenous  PONV Risk Score and Plan: 4 or greater and Scopolamine patch - Pre-op, Midazolam, Dexamethasone, Ondansetron and Diphenhydramine  Airway Management Planned: Oral ETT  Additional Equipment:   Intra-op Plan:   Post-operative Plan: Extubation in OR  Informed Consent: I have reviewed the patients History and Physical, chart, labs and discussed the procedure including the risks, benefits and alternatives for the proposed anesthesia with the patient or authorized representative who has indicated his/her understanding and acceptance.   Dental advisory given  Plan Discussed with: CRNA  Anesthesia Plan Comments: (Language interpreter utilized during pre-operative encounter.)       Anesthesia Quick Evaluation

## 2018-05-12 NOTE — Anesthesia Procedure Notes (Signed)
Procedure Name: Intubation Date/Time: 05/12/2018 7:28 AM Performed by: Raenette Rover, CRNA Pre-anesthesia Checklist: Patient identified, Emergency Drugs available, Suction available and Patient being monitored Patient Re-evaluated:Patient Re-evaluated prior to induction Oxygen Delivery Method: Circle system utilized Preoxygenation: Pre-oxygenation with 100% oxygen Induction Type: IV induction Ventilation: Mask ventilation without difficulty Laryngoscope Size: Mac and 3 Grade View: Grade I Tube type: Oral Tube size: 7.0 mm Number of attempts: 1 Airway Equipment and Method: Stylet Placement Confirmation: ETT inserted through vocal cords under direct vision,  positive ETCO2,  CO2 detector and breath sounds checked- equal and bilateral Secured at: 22 cm Tube secured with: Tape Dental Injury: Teeth and Oropharynx as per pre-operative assessment

## 2018-05-12 NOTE — Op Note (Signed)
Preoperative Diagnosis: Uterine prolapse, midline cystocele, genuine stress incontinence   Postoperative Diagnosis: Uterine prolapse, midline cystocele, genuine stress incontinence, severe pelvic adhesions  Procedure:  Attempted vaginal hysterectomy, laparoscopically assisted vaginal hysterectomy with bilateral salpingo-oophorectomy, extensive lysis of adhesions, cystocele repair, uterosacral ligament suspension, tvt exact mid urethral sling and cystoscopy  Surgeon: Dr Gertie Exon  Assistant: Dr Conley Simmonds  Anesthesia: General  EBL: 500 cc  Fluids: 3,000 cc LR  Urine output: 1,000 cc  Complications: none  Indications for surgery: The patient is a 52 year old female, who presented with symptomatic genital prolapse and urinary incontinence. She used a pessary for several months with sub optimal results. She desired definitive surgery. Work up included urodynamic testing that showed genuine stress incontinence and one detrusor contraction (she declines a h/o urge incontinence).  The patient is aware of the risks and complications involved with the surgery and consent was obtained prior to the procedure.  Findings: grade 2-3 cystocele, grade 2 uterine prolapse, no rectocele, severe pelvic adhesions. Both adnexa were densely adherent to the posterior uterus and pelvic side wall.   Procedure: The patient was taken to the operating room with an IV in placed, preoperative antibiotics and lovenox had been administered. She was placed in the dorsal lithotomy position. General anesthesia was administered. She was prepped and draped in the usual sterile fashion for an abdominal, vaginal surgery. The cervix was circumferentially injected with 0.25% marcaine with epinephrine and incised with a scalpel. The posterior cul de sac was sharply entered. Pelvic adhesions were noted and continuation of the vaginal route of surgery was not felt to be safe. The decision was made to proceed with laparoscopy. The  posterior vagina was closed with a running stitch of 0 Vicryl so pneumoperitoneum could be maintained.   The patient was reprepped and draped for abdominal/vaginal procedure. A rumi uterine manipulator was placed using a 6 mm extender and a 3 cm cup.  A foley catheter was placed.    The umbilicus was everted, injected with 0.25% marcaine and incised with a # 11 blade. 2 towel clips were used to elevated the umbilicus and a veress needle was placed into the abdominal cavity. The abdominal cavity was insufflated with CO2, with normal intraabdominal pressures. After adequate pneumo-insufflation the veress needle was removed and the 5 mm laparoscope was placed into the abdominal cavity using the opti-view trocar. The patient was placed in trendelenburg and the abdominal pelvic cavity was inspected. 3 more trocars were placed: 1 in each lower quadrant approximately 3 cm medial to and superior to the anterior superior iliac spine and one in the midline approximately 6 cm above the pubic symphysis in the midline. These areas were injected with 0.25% marcaine, incised with a #11 blade and all trocars were inserted with direct visualization with the laparoscope. A # 5 airseal trocar was placed in the RLQ, a 5 mm trocar in the LLQ and a 5 mm trocar in the midline. The abdominal pelvic cavity was again inspected.   The decision was made to remove both of her ovaries secondary to the severe adhesive disease.The left retroperitoneal space was entered and developed lateral to the IP ligament using the harmonic scalpel. Ureterolysis was performed. Once the ureter was safely out of the way the left IP ligament was cauterized and cut with the ligasure device.  Extensive lysis of adhesions was performed to safely remove the left adnexa, taking care to dissect the ureter out of the operative field. The left round ligament  was cauterized and cut with the ligasure device and the retroperitoneal space was further developed. The  anterior leaf of the broad ligament was taken down with the ligasure device. The harmonic scalpel was then used to take down the bladder flap and skeltonize the vessels. The left uterine vessels were then clamped, cauterized and ligated with the ligasure device. Hemostasis was excellent. The same procedure was repeated on the right, including the extensive lysis of adhesions and ureterolysis.   Using the rumi manipulator the uterus was pushed up in the pelvic cavity and the harmonic scalpel was used to open the anterior cul de sac. Attention was then turned to the vaginal portion of the case. The abdominal trocars were left in the abdomen for pelvic inspection at the end of the case.    The rumi manipulator was removed and a weighted speculum was placed in the vagina. The stitch that had been placed earlier to close the posterior cul de sac was removed. The utero-sacral ligaments were bilaterally clamped, cut and suture ligated with 0 Vicryl, these stitches were tagged for later use. The cardinal ligaments were bilaterally taken down with the ligasure impact with excellent hemostasis. The uterus and bilateral adnexa were removed vaginally. 3 stitches were placed sequentially in each uterosacral ligament to help with vaginal closure and suspension at the end of the case. The 2 most lateral stitches were placed with 0 Vicryl and the other 4 stitches were placed with 0 PDS.   The anterior vaginal mucoa was grasped with alice clamps, infiltrated with 0.25% marcaine with epinephrine and opened in the midline sharply. The vaginal mucosa was separated off of the bladder with a combination of sharp and blunt dissection. The retropubic space was developed bilaterally for placement of the TVT exact midurethral sling. The cystocele was then reduced with several interrupted sutures of 0 Vicryl. 2 small incisions were made just above the pubic symphysis and lateral to the midline with a scalpel. The foley was removed and  the urethral guide was placed. The midurethral sling was then passed on either side of the urethra, taking care to hug the pubic symphysis and come out through the pre-placed incisions. The urethral guide was removed and cystoscopy was performed. There was no bladder injury and bilateral ureteral jets were noted. The sling tension was adjusted, taking care not to put any tension on the sling under the urethra and the plastic sheath was removed. The tape was cut at the skin edge. There was some bleeding on the left lateral side of the bladder that was stopped with a stitch. A fibrinogen activator gel was placed with excellent hemostasis.   The extra vaginal mucosa was trimmed and the vaginal mucosa was closed to the level of the vaginal cuff with a running stitch of 0 Vicryl. The vaginal cuff was then brought together using the previously placed uterosacral stitches, going laterally to medially on each side. The lower portion of the stitch went through the lower vaginal cuff and the upper portion through the upper vaginal cuff. The vaginal cuff was closed with excellent elevation of the vaginal apex. Hemostasis was excellent. Cystoscopy was performed again and both ureteral jets were peristalsing normally. The cystoscope was removed and the foley catheter was replaced.  The abdomen was insufflated and inspected. There was slight oozing on the left pelvic side wall which was cauterized. One of the uterosacral ligament suspension stitches on the right was noted to have a suture bridge. This was cut to prevent complications.  The pelvis was irrigated and suctioned dry. Hemostasis was excellent.  Arista was placed.    The abdominal cavity was desufflated and the trocars were removed. The skin was closed with subcuticular stiches of 4-0 vicryl and dermabond was placed over the incisions. The suprapubic incisions were just closed with dermabond.   Because the one uterosacral/cuff stitches was removed. Vaginal exam  was performed and one more stitch of 0-Vicryl was placed to insure proper closure of the cuff. The vaginal apex had excellent support. A vaginal pack was placed that was coated in estrogen cream.   The patient's abdomen and perineum were cleansed and she was taken out of the dorsal lithotomy position. Upon awakening she was extubated and taken to the recovery room in stable condition. The sponge and instrument counts were correct.   The surgery was over 5.5 hours, more than 2 x the expected time and with marked increased difficulty because of her extensive adhesions.

## 2018-05-12 NOTE — Progress Notes (Signed)
Day of Surgery Procedure(s) (LRB): ATTEMPTED HYSTERECTOMY VAGINAL (N/A) BILATERAL SALPINGECTOMY (Bilateral) ANTERIOR REPAIR (CYSTOCELE) with uterosacral ligament suspension (N/A) TRANSVAGINAL TAPE (TVT) PROCEDURE (N/A) CYSTOSCOPY (N/A) LAPAROSCOPIC ASSISTED VAGINAL HYSTERECTOMY WITH BILATERAL  SALPINGOOPHRECTOMY AND EXTENSIVE LYSIS OF ADHESIONS (Bilateral)  Subjective: Patient reports mild pain in the vagina and lower abdomen. No current nausea, had emesis earlier. She is very tired, feels a little light headed.   Objective: I have reviewed patient's vital signs, intake and output and labs.  Today's Vitals   05/12/18 1630 05/12/18 1645 05/12/18 1700 05/12/18 1715  BP: 110/74 106/72 104/74 102/77  Pulse: 86 97 75 69  Resp: 18 20 13 13   Temp:      TempSrc:      SpO2: 100% 100% 100% 100%  Weight:      Height:      PainSc: 3   Asleep   BP got down to 78/54 at 15:15, pulse was 94 at that time. The patient was treated with albumin with improvement in her vitals.  No intake/output data recorded. Total I/O In: 3000 [I.V.:3000] Out: 1920 [Urine:1420; Blood:500] Urine output 50 cc in the last hour.   Lab Results  Component Value Date   WBC 9.0 05/12/2018   HGB 7.7 (L) 05/12/2018   HCT 22.9 (L) 05/12/2018   MCV 90.2 05/12/2018   PLT 175 05/12/2018      General: alert, cooperative, fatigued and no distress Resp: clear to auscultation bilaterally Cardio: S1, S2 normal GI: soft, not tender to gentle palpation. Minimally distended, +BS. Extremities: extremities normal, atraumatic, no cyanosis or edema Vaginal Bleeding: pad is ~1/2 full (same pad for 5 hours). Vaginal pack is in the vagina.   Assessment: s/p Procedure(s) with comments: ATTEMPTED HYSTERECTOMY VAGINAL (N/A) - TVH/BS/anterior repair/ uterosacral ligament suspension/TVT/ cysto/ 3 hours/ extended recovery bed BILATERAL SALPINGECTOMY (Bilateral) ANTERIOR REPAIR (CYSTOCELE) with uterosacral ligament suspension  (N/A) TRANSVAGINAL TAPE (TVT) PROCEDURE (N/A) CYSTOSCOPY (N/A) LAPAROSCOPIC ASSISTED VAGINAL HYSTERECTOMY WITH BILATERAL  SALPINGOOPHRECTOMY AND EXTENSIVE LYSIS OF ADHESIONS (Bilateral):  Patient with long difficult surgery, suspect underestimation of intraoperative blood loss. I don't believe that the patient is actively bleeding, but she is very anemic, lightheaded, and I'm sure her hgb will continue to drop some. I spoke with the patient and her daughter and will transfuse 2 u PRBC  Plan: transfuse 2 units PRBC  Otherwise routine post operative care Will continue to watch vitals, urine output and labs As long as she is stable will d/c foley and vaginal pack in the am  LOS: 0 days    Romualdo Bolk 05/12/2018, 5:37 PM

## 2018-05-12 NOTE — OR Nursing (Signed)
PRBC -unit #   [id number] X started at 1910

## 2018-05-13 ENCOUNTER — Encounter (HOSPITAL_BASED_OUTPATIENT_CLINIC_OR_DEPARTMENT_OTHER): Payer: Self-pay | Admitting: Obstetrics and Gynecology

## 2018-05-13 DIAGNOSIS — N814 Uterovaginal prolapse, unspecified: Secondary | ICD-10-CM | POA: Diagnosis not present

## 2018-05-13 DIAGNOSIS — Z79899 Other long term (current) drug therapy: Secondary | ICD-10-CM | POA: Diagnosis not present

## 2018-05-13 DIAGNOSIS — E785 Hyperlipidemia, unspecified: Secondary | ICD-10-CM | POA: Diagnosis not present

## 2018-05-13 DIAGNOSIS — E039 Hypothyroidism, unspecified: Secondary | ICD-10-CM | POA: Diagnosis not present

## 2018-05-13 DIAGNOSIS — D649 Anemia, unspecified: Secondary | ICD-10-CM | POA: Diagnosis not present

## 2018-05-13 DIAGNOSIS — N736 Female pelvic peritoneal adhesions (postinfective): Secondary | ICD-10-CM | POA: Diagnosis not present

## 2018-05-13 DIAGNOSIS — N888 Other specified noninflammatory disorders of cervix uteri: Secondary | ICD-10-CM | POA: Diagnosis not present

## 2018-05-13 DIAGNOSIS — N393 Stress incontinence (female) (male): Secondary | ICD-10-CM | POA: Diagnosis not present

## 2018-05-13 DIAGNOSIS — I10 Essential (primary) hypertension: Secondary | ICD-10-CM | POA: Diagnosis not present

## 2018-05-13 DIAGNOSIS — N959 Unspecified menopausal and perimenopausal disorder: Secondary | ICD-10-CM | POA: Diagnosis not present

## 2018-05-13 DIAGNOSIS — Z885 Allergy status to narcotic agent status: Secondary | ICD-10-CM | POA: Diagnosis not present

## 2018-05-13 DIAGNOSIS — N72 Inflammatory disease of cervix uteri: Secondary | ICD-10-CM | POA: Diagnosis not present

## 2018-05-13 LAB — BPAM RBC
BLOOD PRODUCT EXPIRATION DATE: 201910242359
BLOOD PRODUCT EXPIRATION DATE: 201910242359
ISSUE DATE / TIME: 201910011839
ISSUE DATE / TIME: 201910012322
UNIT TYPE AND RH: 6200
UNIT TYPE AND RH: 6200

## 2018-05-13 LAB — TYPE AND SCREEN
ABO/RH(D): A POS
Antibody Screen: NEGATIVE
Unit division: 0
Unit division: 0

## 2018-05-13 LAB — CBC
HCT: 30.5 % — ABNORMAL LOW (ref 36.0–46.0)
Hemoglobin: 10.4 g/dL — ABNORMAL LOW (ref 12.0–15.0)
MCH: 30.5 pg (ref 26.0–34.0)
MCHC: 34.1 g/dL (ref 30.0–36.0)
MCV: 89.4 fL (ref 78.0–100.0)
Platelets: 167 10*3/uL (ref 150–400)
RBC: 3.41 MIL/uL — ABNORMAL LOW (ref 3.87–5.11)
RDW: 12.8 % (ref 11.5–15.5)
WBC: 8.9 10*3/uL (ref 4.0–10.5)

## 2018-05-13 MED ORDER — TRAMADOL HCL 50 MG PO TABS: ORAL_TABLET | ORAL | 0 refills | Status: DC

## 2018-05-13 MED ORDER — IBUPROFEN 800 MG PO TABS: 800.0000 mg | ORAL_TABLET | Freq: Three times a day (TID) | ORAL | 0 refills | Status: DC | PRN

## 2018-05-13 MED ORDER — DOCUSATE SODIUM 100 MG PO CAPS: 100.0000 mg | ORAL_CAPSULE | Freq: Two times a day (BID) | ORAL | 0 refills | Status: DC

## 2018-05-13 MED ORDER — ACETAMINOPHEN 325 MG PO TABS: 650.0000 mg | ORAL_TABLET | ORAL | 1 refills | Status: DC | PRN

## 2018-05-13 NOTE — Anesthesia Postprocedure Evaluation (Signed)
Anesthesia Post Note  Patient: Becky Goodwin  Procedure(s) Performed: ATTEMPTED HYSTERECTOMY VAGINAL (N/A Vagina ) BILATERAL SALPINGECTOMY (Bilateral Abdomen) ANTERIOR REPAIR (CYSTOCELE) with uterosacral ligament suspension (N/A Vagina ) TRANSVAGINAL TAPE (TVT) PROCEDURE (N/A Vagina ) CYSTOSCOPY (N/A ) LAPAROSCOPIC ASSISTED VAGINAL HYSTERECTOMY WITH BILATERAL  SALPINGOOPHRECTOMY AND EXTENSIVE LYSIS OF ADHESIONS (Bilateral Abdomen)     Patient location during evaluation: PACU Anesthesia Type: General Level of consciousness: awake and alert, awake and oriented Pain management: pain level controlled Vital Signs Assessment: post-procedure vital signs reviewed and stable Respiratory status: spontaneous breathing, nonlabored ventilation and respiratory function stable Cardiovascular status: blood pressure returned to baseline and stable Postop Assessment: no apparent nausea or vomiting Anesthetic complications: no    Last Vitals:  Vitals:   05/13/18 0605 05/13/18 0950  BP: 94/61 102/66  Pulse: 78 67  Resp: 18 18  Temp: 36.8 C 36.4 C  SpO2: 99% 99%    Last Pain:  Vitals:   05/13/18 0950  TempSrc: Oral  PainSc:                  Cecile Hearing

## 2018-05-13 NOTE — Progress Notes (Signed)
1 Day Post-Op Procedure(s) (LRB): ATTEMPTED HYSTERECTOMY VAGINAL (N/A) BILATERAL SALPINGECTOMY (Bilateral) ANTERIOR REPAIR (CYSTOCELE) with uterosacral ligament suspension (N/A) TRANSVAGINAL TAPE (TVT) PROCEDURE (N/A) CYSTOSCOPY (N/A) LAPAROSCOPIC ASSISTED VAGINAL HYSTERECTOMY WITH BILATERAL  SALPINGOOPHRECTOMY AND EXTENSIVE LYSIS OF ADHESIONS (Bilateral)  Subjective: Patient reports that she is feeling better. Pain is a 4/10 in severity, tolerable. She hasn't been out of bed yet. Not lightheaded. S/P 2 units of PRBC yesterday  Objective: I have reviewed patient's vital signs, intake and output and labs.  Today's Vitals   05/12/18 2356 05/13/18 0054 05/13/18 0234 05/13/18 0605  BP: 92/63 101/62 100/70 94/61  Pulse: 72 85 64 78  Resp: 16 16 16 18   Temp: 99 F (37.2 C) 98.7 F (37.1 C) 99.6 F (37.6 C) 98.3 F (36.8 C)  TempSrc: Oral Oral Oral Oral  SpO2: 100% 100% 100% 99%  Weight:      Height:      PainSc:    6    I/O last 3 completed shifts: In: 4959.6 [P.O.:350; I.V.:4131.3; Blood:478.3] Out: 1610 [RUEAV:4098; Blood:500] No intake/output data recorded.   CBC Latest Ref Rng & Units 05/13/2018 05/12/2018 05/12/2018  WBC 4.0 - 10.5 K/uL 8.9 8.3 9.0  Hemoglobin 12.0 - 15.0 g/dL 10.4(L) 7.7(L) 7.7(L)  Hematocrit 36.0 - 46.0 % 30.5(L) 22.7(L) 22.9(L)  Platelets 150 - 400 K/uL 167 163 175     General: alert, cooperative and no distress Resp: clear to auscultation bilaterally Cardio: S1, S2 normal GI: soft, not tender, +BS, minimal distention.  Extremities: extremities normal, atraumatic, no cyanosis or edema Vaginal Bleeding: minimal and vaginal pack removed, moderate amount of dry blood  Assessment: s/p Procedure(s) with comments: ATTEMPTED HYSTERECTOMY VAGINAL (N/A) - TVH/BS/anterior repair/ uterosacral ligament suspension/TVT/ cysto/ 3 hours/ extended recovery bed BILATERAL SALPINGECTOMY (Bilateral) ANTERIOR REPAIR (CYSTOCELE) with uterosacral ligament suspension  (N/A) TRANSVAGINAL TAPE (TVT) PROCEDURE (N/A) CYSTOSCOPY (N/A) LAPAROSCOPIC ASSISTED VAGINAL HYSTERECTOMY WITH BILATERAL  SALPINGOOPHRECTOMY AND EXTENSIVE LYSIS OF ADHESIONS (Bilateral): stable  Plan: Advance diet Encourage ambulation  Will fill bladder with 300 cc of sterile water, remove foley and see if she can void. If she voids less than 150 cc will replace foley Anticipate d/c later today.   LOS: 0 days    Romualdo Bolk 05/13/2018, 7:52 AM

## 2018-05-13 NOTE — Progress Notes (Signed)
2nd unit of PRBC with W# [id number] started at 2342

## 2018-05-18 ENCOUNTER — Telehealth: Payer: Self-pay | Admitting: *Deleted

## 2018-05-18 NOTE — Telephone Encounter (Signed)
Patient's daughter is returning a call to [place].

## 2018-05-18 NOTE — Telephone Encounter (Signed)
Spoke with patients daughter, [place], ok per dpr. Advised of results as seen below.   Reports fever 2 days ago, "went away with motrin, did not check temperature with thermometer, used back of hand, felt warm". Daughter states she does have a thermometer, recommended to take temp, if above 100.4, return call to office.  Up and ambulating, appetite  increasing, eating bland diet and hydrating well. Normal BMs since surgery.  No longer taking tramadol, doing well with motrin q8 prn.  Describes bleeding as spotting. Daughter states patient wears a panty liner at night, wakes up in morning and panty liner is wet, is unsure if this is urine, plans to discuss at OV. Patient has OV scheduled for 10/8 at 11am with Dr. Oscar La. Advised to keep OV as scheduled, will review with Dr. Oscar La and return call if any additional recommendations.   Dr. Oscar La -routing FYI.

## 2018-05-18 NOTE — Telephone Encounter (Signed)
-----   Message from Romualdo Bolk, MD sent at 05/15/2018  2:56 PM EDT ----- Please let the patient know that her pathology was all normal and check on how she is feeling (need to speak through an interpreter or with a family member who speaks [place])

## 2018-05-18 NOTE — Telephone Encounter (Signed)
Notes recorded by Leda Min, RN on 05/18/2018 at 2:18 PM EDT Call placed to Radhika, daughter, ok per dpr. No answer, unable to leave message, voicemail not set up. ------

## 2018-05-18 NOTE — Progress Notes (Signed)
yel[facility]OLOGY  VISIT   HPI: 52 y.o.   Married Asian Not Hispanic or Latino  female   [phone] with Patient's last menstrual period was 10/06/2012.   here for 1 week post op s/p Attempted vaginal hysterectomy, laparoscopically assisted vaginal hysterectomy with bilateral salpingo-oophorectomy, extensive lysis of adhesions, cystocele repair, uterosacral ligament suspension, tvt exact mid urethral sling and cystoscopy. She c/o worsening pain in her umbilicus and in her lower abdomen and vagina. Some bleeding from her umbilicus. She is taking the ibuprofen around the clock. Not taking the tramadol, was making her tired. Her pain is worse without the Tramadol. She doesn't hurt as much as she did when she left the hospital.  She felt "warm to the touch" 2 days ago, no fever that she is aware of. No nausea or emesis. Feels dizzy sometimes, not like she is going to fall down. Not very hungry. She c/o dysuria since her surgery, she has noted some wetness on her pad in the morning. One night was a lot, otherwise just a little bit.  She sneezed yesterday and leaked a drop of urine.  Her urinary stream can start and stop, she does feel empty at the end. Stream is slow.   GYNECOLOGIC HISTORY: Patient's last menstrual period was 10/06/2012. Contraception: Postmenopausal Menopausal hormone therapy: None        OB History    Gravida  3   Para  3   Term  3   Preterm      AB      Living  3     SAB      TAB      Ectopic      Multiple      Live Births  3              Patient Active Problem List   Diagnosis Date Noted  . S/P laparoscopic assisted vaginal hysterectomy (LAVH) 05/12/2018  . Subclinical hyperthyroidism 05/07/2018  . Muscle spasm 03/06/2018  . Chronic neck pain 03/06/2018  . Chronic upper back pain 03/06/2018  . Palpitation 03/06/2018  . Abnormal TSH 01/08/2018  . Female bladder prolapse 06/18/2017  . Dizziness 01/13/2017  . Hyperlipidemia 01/13/2017  . Weight gain  12/13/2016  . Edema 12/13/2016  . Pelvic pain in female 10/27/2015  . Chronic right shoulder pain 10/27/2015  . Encounter for health maintenance examination in adult 10/27/2015  . Special screening for malignant neoplasms, colon 10/27/2015  . Screening for breast cancer 10/27/2015    Past Medical History:  Diagnosis Date  . Chronic neck pain   . Hyperlipidemia   . Hypertension   . Shoulder pain   . Subclinical hyperthyroidism 05/07/2018    Past Surgical History:  Procedure Laterality Date  . BILATERAL SALPINGECTOMY Bilateral 05/12/2018   Procedure: BILATERAL SALPINGECTOMY;  Surgeon: Romualdo Bolk, MD;  Location: [facility];  Service: Gynecology;  Laterality: Bilateral;  . BLADDER SUSPENSION N/A 05/12/2018   Procedure: TRANSVAGINAL TAPE (TVT) PROCEDURE;  Surgeon: Romualdo Bolk, MD;  Location: [facility];  Service: Gynecology;  Laterality: N/A;  . BREAST LUMPECTOMY Right 2010  . CYST EXCISION     upper chest/ breast ([facility]), 1 week hospitalization  . CYSTOCELE REPAIR N/A 05/12/2018   Procedure: ANTERIOR REPAIR (CYSTOCELE) with uterosacral ligament suspension;  Surgeon: Romualdo Bolk, MD;  Location: [facility];  Service: Gynecology;  Laterality: N/A;  . CYSTOSCOPY N/A 05/12/2018   Procedure: CYSTOSCOPY;  Surgeon: Romualdo Bolk, MD;  Location:  [facility];  Service: Gynecology;  Laterality: N/A;  . JEJUNOSTOMY FEEDING TUBE     prior with surgery  . LAPAROSCOPIC VAGINAL HYSTERECTOMY WITH SALPINGECTOMY Bilateral 05/12/2018   Procedure: LAPAROSCOPIC ASSISTED VAGINAL HYSTERECTOMY WITH BILATERAL  SALPINGOOPHRECTOMY AND EXTENSIVE LYSIS OF ADHESIONS;  Surgeon: Romualdo Bolk, MD;  Location: [facility];  Service: Gynecology;  Laterality: Bilateral;  . TONSILLECTOMY     Right tonsillectomy, 2wk hospitalization ([facility])  . VAGINAL HYSTERECTOMY N/A 05/12/2018   Procedure: ATTEMPTED  HYSTERECTOMY VAGINAL;  Surgeon: Romualdo Bolk, MD;  Location: [facility];  Service: Gynecology;  Laterality: N/A;  TVH/BS/anterior repair/ uterosacral ligament suspension/TVT/ cysto/ 3 hours/ extended recovery bed    Current Outpatient Medications  Medication Sig Dispense Refill  . acetaminophen (TYLENOL) 325 MG tablet Take 2 tablets (650 mg total) by mouth every 4 (four) hours as needed for mild pain. 30 tablet 1  . cyclobenzaprine (FLEXERIL) 5 MG tablet Take 1 tablet (5 mg total) by mouth 2 (two) times daily as needed for muscle spasms. 60 tablet 1  . docusate sodium (COLACE) 100 MG capsule Take 1 capsule (100 mg total) by mouth 2 (two) times daily. 10 capsule 0  . ibuprofen (ADVIL,MOTRIN) 800 MG tablet Take 1 tablet (800 mg total) by mouth every 8 (eight) hours as needed. 30 tablet 0  . amoxicillin-clavulanate (AUGMENTIN) 875-125 MG tablet Take 1 tablet by mouth 2 (two) times daily. 14 tablet 0  . phenazopyridine (PYRIDIUM) 200 MG tablet Take 1 tablet (200 mg total) by mouth 3 (three) times daily as needed. 6 tablet 0  . traMADol (ULTRAM) 50 MG tablet One tablet po q 4-6 hours prn pain (Patient not taking: Reported on 05/19/2018) 30 tablet 0   No current facility-administered medications for this visit.      ALLERGIES: Hydrocodone  Family History  Problem Relation Age of Onset  . Hypertension Father   . Cancer Neg Hx   . Diabetes Neg Hx   . Heart disease Neg Hx   . Stroke Neg Hx     Social History   Socioeconomic History  . Marital status: Married    Spouse name: Not on file  . Number of children: Not on file  . Years of education: Not on file  . Highest education level: Not on file  Occupational History  . Not on file  Social Needs  . Financial resource strain: Not on file  . Food insecurity:    Worry: Not on file    Inability: Not on file  . Transportation needs:    Medical: Not on file    Non-medical: Not on file  Tobacco Use  . Smoking  status: Never Smoker  . Smokeless tobacco: Never Used  Substance and Sexual Activity  . Alcohol use: No    Alcohol/week: 0.0 standard drinks  . Drug use: No  . Sexual activity: Not Currently    Partners: Male    Birth control/protection: None, Post-menopausal  Lifestyle  . Physical activity:    Days per week: Not on file    Minutes per session: Not on file  . Stress: Not on file  Relationships  . Social connections:    Talks on phone: Not on file    Gets together: Not on file    Attends religious service: Not on file    Active member of club or organization: Not on file    Attends meetings of clubs or organizations: Not on file  Relationship status: Not on file  . Intimate partner violence:    Fear of current or ex partner: Not on file    Emotionally abused: Not on file    Physically abused: Not on file    Forced sexual activity: Not on file  Other Topics Concern  . Not on file  Social History Narrative   Lives with husband, 3 children, ages 63yo, 58yo, 45yo, works in Designer, fashion/clothing, various jobs, some exercise with walking    Review of Systems  Constitutional: Negative.   HENT: Negative.   Eyes: Negative.   Respiratory: Negative.   Cardiovascular: Negative.   Gastrointestinal: Positive for abdominal pain.  Endocrine: Negative.   Genitourinary: Positive for dysuria and vaginal pain.  Musculoskeletal: Negative.   Skin: Negative.   Allergic/Immunologic: Negative.   Neurological: Negative.   Hematological: Negative.   Psychiatric/Behavioral: Negative.     PHYSICAL EXAMINATION:    BP 110/68 (BP Location: Right Arm, Patient Position: Sitting, Cuff Size: Normal)   Pulse 84   Wt 130 lb 6.4 oz (59.1 kg)   LMP 10/06/2012   BMI 23.10 kg/m    T 98.6 General appearance: alert, cooperative and appears stated age Abdomen: soft, mildly tender in her lower abdomen, non distended, no masses,  no organomegaly Incisions: some blood around her umbilical incision. Cleaned with a  sterile swab  Pelvic: External genitalia:  no lesions              Urethra:  normal appearing urethra with no masses, tenderness or lesions              Bartholins and Skenes: normal                 Vagina:no speculum exam done.   BM exam: tender with palpation of her suture line, bladder and vaginal cuff. Good vault suspension. No masses  PVR: 30 cc  Chaperone was present for exam.  ASSESSMENT Post op pain, she is only taking ibuprofen, has significant bruising. Vaginal cuff and anterior vagina bladder are tender, concerning for possible cuff cellulitis. Afebrile  Dysuria Urinary hesitancy, normal PVR    PLAN CBC with diff Send urine for ua c&s Will treat with Augmentin to cover possible cuff cellulitis and possible UTI Pyridium for 48 hours F/U in 48 hours Discussed taking ibuprofen, tylenol and Tramadol   An After Visit Summary was printed and given to the patient.

## 2018-05-19 ENCOUNTER — Encounter: Payer: Self-pay | Admitting: Obstetrics and Gynecology

## 2018-05-19 ENCOUNTER — Telehealth: Payer: Self-pay | Admitting: *Deleted

## 2018-05-19 ENCOUNTER — Ambulatory Visit (INDEPENDENT_AMBULATORY_CARE_PROVIDER_SITE_OTHER): Payer: BLUE CROSS/BLUE SHIELD | Admitting: Obstetrics and Gynecology

## 2018-05-19 ENCOUNTER — Encounter: Payer: Self-pay | Admitting: Emergency Medicine

## 2018-05-19 ENCOUNTER — Other Ambulatory Visit: Payer: Self-pay

## 2018-05-19 VITALS — BP 110/68 | HR 84 | Wt 130.4 lb

## 2018-05-19 DIAGNOSIS — Z90710 Acquired absence of both cervix and uterus: Secondary | ICD-10-CM

## 2018-05-19 DIAGNOSIS — G8918 Other acute postprocedural pain: Secondary | ICD-10-CM | POA: Diagnosis not present

## 2018-05-19 DIAGNOSIS — R300 Dysuria: Secondary | ICD-10-CM | POA: Diagnosis not present

## 2018-05-19 LAB — CBC WITH DIFFERENTIAL/PLATELET
BASOS: 0 %
Basophils Absolute: 0 10*3/uL (ref 0.0–0.2)
EOS (ABSOLUTE): 0.3 10*3/uL (ref 0.0–0.4)
Eos: 4 %
Hematocrit: 30 % — ABNORMAL LOW (ref 34.0–46.6)
Hemoglobin: 10.2 g/dL — ABNORMAL LOW (ref 11.1–15.9)
LYMPHS ABS: 2 10*3/uL (ref 0.7–3.1)
Lymphs: 22 %
MCH: 30.4 pg (ref 26.6–33.0)
MCHC: 34 g/dL (ref 31.5–35.7)
MCV: 89 fL (ref 79–97)
MONOS ABS: 0.7 10*3/uL (ref 0.1–0.9)
Monocytes: 8 %
NEUTROS ABS: 6 10*3/uL (ref 1.4–7.0)
Neutrophils: 66 %
PLATELETS: 309 10*3/uL (ref 150–450)
RBC: 3.36 x10E6/uL — ABNORMAL LOW (ref 3.77–5.28)
RDW: 13 % (ref 12.3–15.4)
WBC: 9.1 10*3/uL (ref 3.4–10.8)

## 2018-05-19 LAB — POCT URINALYSIS DIPSTICK
BILIRUBIN UA: NEGATIVE
Glucose, UA: NEGATIVE
KETONES UA: NEGATIVE
Nitrite, UA: NEGATIVE
Protein, UA: NEGATIVE
RBC UA: POSITIVE
SPEC GRAV UA: 1.01 (ref 1.010–1.025)
Urobilinogen, UA: 0.2 E.U./dL
pH, UA: 5 (ref 5.0–8.0)

## 2018-05-19 MED ORDER — AMOXICILLIN-POT CLAVULANATE 875-125 MG PO TABS: 1.0000 | ORAL_TABLET | Freq: Two times a day (BID) | ORAL | 0 refills | Status: DC

## 2018-05-19 MED ORDER — PHENAZOPYRIDINE HCL 200 MG PO TABS: 200.0000 mg | ORAL_TABLET | Freq: Three times a day (TID) | ORAL | 0 refills | Status: DC | PRN

## 2018-05-19 NOTE — Telephone Encounter (Signed)
Spoke with patient's daughter Mardene Celeste, okay per ROI. Advised of results. Radhik will notify patient. Encounter closed.

## 2018-05-19 NOTE — Telephone Encounter (Signed)
Spoke with Helmut Muster from [facility], reviewed CBC results, available in Epic. Dr. Oscar La notified. Helmut Muster states copy faxed to our office as well.    Encounter closed.

## 2018-05-19 NOTE — Telephone Encounter (Signed)
-----   Message from Romualdo Bolk, MD sent at 05/19/2018  1:52 PM EDT ----- Please inform the patients daughter Mardene Celeste, [phone]) that her WBC and diff are normal (very reassuring) and her hgb is stable at 10.2.

## 2018-05-20 LAB — URINALYSIS, MICROSCOPIC ONLY: Casts: NONE SEEN /lpf

## 2018-05-21 ENCOUNTER — Other Ambulatory Visit: Payer: Self-pay

## 2018-05-21 ENCOUNTER — Ambulatory Visit: Payer: BLUE CROSS/BLUE SHIELD | Admitting: Obstetrics and Gynecology

## 2018-05-21 ENCOUNTER — Encounter: Payer: Self-pay | Admitting: Obstetrics and Gynecology

## 2018-05-21 VITALS — BP 114/82 | HR 76 | Wt 132.4 lb

## 2018-05-21 DIAGNOSIS — Z90710 Acquired absence of both cervix and uterus: Secondary | ICD-10-CM

## 2018-05-21 LAB — URINE CULTURE: Organism ID, Bacteria: NO GROWTH

## 2018-05-21 MED ORDER — IBUPROFEN 800 MG PO TABS: 800.0000 mg | ORAL_TABLET | Freq: Three times a day (TID) | ORAL | 1 refills | Status: DC | PRN

## 2018-05-21 MED ORDER — TRAMADOL HCL 50 MG PO TABS: ORAL_TABLET | ORAL | 0 refills | Status: DC

## 2018-05-21 NOTE — Progress Notes (Signed)
GYNECOLOGY  VISIT   HPI: 52 y.o.   Married Asian Not Hispanic or Latino  female   [phone] with Patient's last menstrual period was 10/06/2012.   here for follow up appointment. Patient reports she is still having abdominal pain. Denies UTI symptoms at this time.  Her lower abdominal pain is gone, bladder symptoms are better. No urinary leakage in the last 2 days. Umbilicus is still store, no bleeding or drainage. No more leakage of urine. She started feeling so much better within a few hours of taking the pain medication and the antibiotics/pyridium. She was able to walk some yesterday. She is almost out of the ibuprofen and tramadol.  GYNECOLOGIC HISTORY: Patient's last menstrual period was 10/06/2012. Contraception: Post menopausal Menopausal hormone therapy: None        OB History    Gravida  3   Para  3   Term  3   Preterm      AB      Living  3     SAB      TAB      Ectopic      Multiple      Live Births  3              Patient Active Problem List   Diagnosis Date Noted  . S/P laparoscopic assisted vaginal hysterectomy (LAVH) 05/12/2018  . Subclinical hyperthyroidism 05/07/2018  . Muscle spasm 03/06/2018  . Chronic neck pain 03/06/2018  . Chronic upper back pain 03/06/2018  . Palpitation 03/06/2018  . Abnormal TSH 01/08/2018  . Female bladder prolapse 06/18/2017  . Dizziness 01/13/2017  . Hyperlipidemia 01/13/2017  . Weight gain 12/13/2016  . Edema 12/13/2016  . Pelvic pain in female 10/27/2015  . Chronic right shoulder pain 10/27/2015  . Encounter for health maintenance examination in adult 10/27/2015  . Special screening for malignant neoplasms, colon 10/27/2015  . Screening for breast cancer 10/27/2015    Past Medical History:  Diagnosis Date  . Chronic neck pain   . Hyperlipidemia   . Hypertension   . Shoulder pain   . Subclinical hyperthyroidism 05/07/2018    Past Surgical History:  Procedure Laterality Date  . BILATERAL SALPINGECTOMY  Bilateral 05/12/2018   Procedure: BILATERAL SALPINGECTOMY;  Surgeon: Romualdo Bolk, MD;  Location: [facility];  Service: Gynecology;  Laterality: Bilateral;  . BLADDER SUSPENSION N/A 05/12/2018   Procedure: TRANSVAGINAL TAPE (TVT) PROCEDURE;  Surgeon: Romualdo Bolk, MD;  Location: [facility];  Service: Gynecology;  Laterality: N/A;  . BREAST LUMPECTOMY Right 2010  . CYST EXCISION     upper chest/ breast ([facility]), 1 week hospitalization  . CYSTOCELE REPAIR N/A 05/12/2018   Procedure: ANTERIOR REPAIR (CYSTOCELE) with uterosacral ligament suspension;  Surgeon: Romualdo Bolk, MD;  Location: [facility];  Service: Gynecology;  Laterality: N/A;  . CYSTOSCOPY N/A 05/12/2018   Procedure: CYSTOSCOPY;  Surgeon: Romualdo Bolk, MD;  Location: [facility];  Service: Gynecology;  Laterality: N/A;  . JEJUNOSTOMY FEEDING TUBE     prior with surgery  . LAPAROSCOPIC VAGINAL HYSTERECTOMY WITH SALPINGECTOMY Bilateral 05/12/2018   Procedure: LAPAROSCOPIC ASSISTED VAGINAL HYSTERECTOMY WITH BILATERAL  SALPINGOOPHRECTOMY AND EXTENSIVE LYSIS OF ADHESIONS;  Surgeon: Romualdo Bolk, MD;  Location: [facility];  Service: Gynecology;  Laterality: Bilateral;  . TONSILLECTOMY     Right tonsillectomy, 2wk hospitalization ([facility])  . VAGINAL HYSTERECTOMY N/A 05/12/2018   Procedure: ATTEMPTED HYSTERECTOMY VAGINAL;  Surgeon: Gertie Exon  Renea Ee, MD;  Location: [facility];  Service: Gynecology;  Laterality: N/A;  TVH/BS/anterior repair/ uterosacral ligament suspension/TVT/ cysto/ 3 hours/ extended recovery bed    Current Outpatient Medications  Medication Sig Dispense Refill  . acetaminophen (TYLENOL) 325 MG tablet Take 2 tablets (650 mg total) by mouth every 4 (four) hours as needed for mild pain. 30 tablet 1  . amoxicillin-clavulanate (AUGMENTIN) 875-125 MG tablet Take 1 tablet by mouth 2 (two) times daily.  14 tablet 0  . cyclobenzaprine (FLEXERIL) 5 MG tablet Take 1 tablet (5 mg total) by mouth 2 (two) times daily as needed for muscle spasms. 60 tablet 1  . docusate sodium (COLACE) 100 MG capsule Take 1 capsule (100 mg total) by mouth 2 (two) times daily. 10 capsule 0  . ibuprofen (ADVIL,MOTRIN) 800 MG tablet Take 1 tablet (800 mg total) by mouth every 8 (eight) hours as needed. 30 tablet 0  . phenazopyridine (PYRIDIUM) 200 MG tablet Take 1 tablet (200 mg total) by mouth 3 (three) times daily as needed. 6 tablet 0  . traMADol (ULTRAM) 50 MG tablet One tablet po q 4-6 hours prn pain 30 tablet 0   No current facility-administered medications for this visit.      ALLERGIES: Hydrocodone  Family History  Problem Relation Age of Onset  . Hypertension Father   . Cancer Neg Hx   . Diabetes Neg Hx   . Heart disease Neg Hx   . Stroke Neg Hx     Social History   Socioeconomic History  . Marital status: Married    Spouse name: Not on file  . Number of children: Not on file  . Years of education: Not on file  . Highest education level: Not on file  Occupational History  . Not on file  Social Needs  . Financial resource strain: Not on file  . Food insecurity:    Worry: Not on file    Inability: Not on file  . Transportation needs:    Medical: Not on file    Non-medical: Not on file  Tobacco Use  . Smoking status: Never Smoker  . Smokeless tobacco: Never Used  Substance and Sexual Activity  . Alcohol use: No    Alcohol/week: 0.0 standard drinks  . Drug use: No  . Sexual activity: Not Currently    Partners: Male    Birth control/protection: None, Post-menopausal  Lifestyle  . Physical activity:    Days per week: Not on file    Minutes per session: Not on file  . Stress: Not on file  Relationships  . Social connections:    Talks on phone: Not on file    Gets together: Not on file    Attends religious service: Not on file    Active member of club or organization: Not on file     Attends meetings of clubs or organizations: Not on file    Relationship status: Not on file  . Intimate partner violence:    Fear of current or ex partner: Not on file    Emotionally abused: Not on file    Physically abused: Not on file    Forced sexual activity: Not on file  Other Topics Concern  . Not on file  Social History Narrative   Lives with husband, 3 children, ages 57yo, 91yo, 53yo, works in Designer, fashion/clothing, various jobs, some exercise with walking    Review of Systems  Constitutional: Negative.   HENT: Negative.   Eyes: Negative.  Respiratory: Negative.   Cardiovascular: Negative.   Gastrointestinal: Positive for abdominal pain.  Endocrine: Negative.   Genitourinary: Negative.   Musculoskeletal: Negative.   Skin: Negative.   Allergic/Immunologic: Negative.   Neurological: Negative.   Hematological: Negative.   Psychiatric/Behavioral: Negative.     PHYSICAL EXAMINATION:    LMP 10/06/2012     General appearance: alert, cooperative and appears stated age Abdomen: soft, appropriately tender. Ecchymosis over the mons pubis and around trocar sites.  Incisions are all clean, dry and intact without erythema.  Pelvic: External genitalia:  no lesions              Urethra:  normal appearing urethra with no masses, tenderness or lesions              Bartholins and Skenes: normal                 Bimanual Exam:  Minimally tender with palpation of the vaginal cuff, less tender than 2 days ago, anterior vaginal wall/bladder not tender.   Chaperone was present for exam.  ASSESSMENT 9 days post op s/p attempted vaginal hysterectomy, LAVH with extensive LOA, BSO, cystocele repair, uterosacral ligament suspension, cystoscopy. She was started on antibiotics 2 days ago for possible UTI, possible cuff cellulitis. Her WBC and diff were normal. Urine culture is pending. She is feeling much better with restarting her pain meds and the antibiotics. Exam has improved    PLAN Another script  for ibuprofen sent, she should take ibuprofen and tylenol as her base and use tramadol as needed (refill of 20 tramadol sent) Continue the antibiotics Await urine culture results F/U in one week, sooner with concerns   An After Visit Summary was printed and given to the patient.

## 2018-05-25 NOTE — Progress Notes (Signed)
GYNECOLOGY  VISIT   HPI: 52 y.o.   Married Asian Not Hispanic or Latino  female   [phone] with Patient's last menstrual period was 10/06/2012.   here for follow up of abdominal pain post op. The patient is 2+ weeks s/p LAVH. LOA, cystocele repair, uterosacral ligament suspension and TVT. No problems voiding, no leaking. She continues to have umbilical pain and some vaginal pain, but improving. Some vaginal spotting. Pain is a 2-3/10 in severity.   GYNECOLOGIC HISTORY: Patient's last menstrual period was 10/06/2012. Contraception: Postmenopausal Menopausal hormone therapy: None        OB History    Gravida  3   Para  3   Term  3   Preterm      AB      Living  3     SAB      TAB      Ectopic      Multiple      Live Births  3              Patient Active Problem List   Diagnosis Date Noted  . S/P laparoscopic assisted vaginal hysterectomy (LAVH) 05/12/2018  . Subclinical hyperthyroidism 05/07/2018  . Muscle spasm 03/06/2018  . Chronic neck pain 03/06/2018  . Chronic upper back pain 03/06/2018  . Palpitation 03/06/2018  . Abnormal TSH 01/08/2018  . Female bladder prolapse 06/18/2017  . Dizziness 01/13/2017  . Hyperlipidemia 01/13/2017  . Weight gain 12/13/2016  . Edema 12/13/2016  . Pelvic pain in female 10/27/2015  . Chronic right shoulder pain 10/27/2015  . Encounter for health maintenance examination in adult 10/27/2015  . Special screening for malignant neoplasms, colon 10/27/2015  . Screening for breast cancer 10/27/2015    Past Medical History:  Diagnosis Date  . Chronic neck pain   . Hyperlipidemia   . Hypertension   . Shoulder pain   . Subclinical hyperthyroidism 05/07/2018    Past Surgical History:  Procedure Laterality Date  . BILATERAL SALPINGECTOMY Bilateral 05/12/2018   Procedure: BILATERAL SALPINGECTOMY;  Surgeon: Romualdo Bolk, MD;  Location[facility];  Service: Gynecology;  Laterality: Bilateral;  . BLADDER  SUSPENSION N/A 05/12/2018   Procedure: TRANSVAGINAL TAPE (TVT) PROCEDURE;  Surgeon: Romualdo Bolk, MD;  Location: [facility];  Service: Gynecology;  Laterality: N/A;  . BREAST LUMPECTOMY Right 2010  . CYST EXCISION     upper chest/ breast ([place]), 1 week hospitalization  . CYSTOCELE REPAIR N/A 05/12/2018   Procedure: ANTERIOR REPAIR (CYSTOCELE) with uterosacral ligament suspension;  Surgeon: Romualdo Bolk, MD;  Location: [facility];  Service: Gynecology;  Laterality: N/A;  . CYSTOSCOPY N/A 05/12/2018   Procedure: CYSTOSCOPY;  Surgeon: Romualdo Bolk, MD;  Location: [facility];  Service: Gynecology;  Laterality: N/A;  . JEJUNOSTOMY FEEDING TUBE     prior with surgery  . LAPAROSCOPIC VAGINAL HYSTERECTOMY WITH SALPINGECTOMY Bilateral 05/12/2018   Procedure: LAPAROSCOPIC ASSISTED VAGINAL HYSTERECTOMY WITH BILATERAL  SALPINGOOPHRECTOMY AND EXTENSIVE LYSIS OF ADHESIONS;  Surgeon: Romualdo Bolk, MD;  Location: [facility];  Service: Gynecology;  Laterality: Bilateral;  . TONSILLECTOMY     Right tonsillectomy, 2wk hospitalization ([place])  . VAGINAL HYSTERECTOMY N/A 05/12/2018   Procedure: ATTEMPTED HYSTERECTOMY VAGINAL;  Surgeon: Romualdo Bolk, MD;  Location: [facility];  Service: Gynecology;  Laterality: N/A;  TVH/BS/anterior repair/ uterosacral ligament suspension/TVT/ cysto/ 3 hours/ extended recovery bed    Current Outpatient Medications  Medication Sig  Dispense Refill  . acetaminophen (TYLENOL) 325 MG tablet Take 2 tablets (650 mg total) by mouth every 4 (four) hours as needed for mild pain. 30 tablet 1  . cyclobenzaprine (FLEXERIL) 5 MG tablet Take 1 tablet (5 mg total) by mouth 2 (two) times daily as needed for muscle spasms. 60 tablet 1  . docusate sodium (COLACE) 100 MG capsule Take 1 capsule (100 mg total) by mouth 2 (two) times daily. 10 capsule 0  . ibuprofen (ADVIL,MOTRIN) 800 MG  tablet Take 1 tablet (800 mg total) by mouth every 8 (eight) hours as needed. 30 tablet 1  . traMADol (ULTRAM) 50 MG tablet One tablet po q 4-6 hours prn pain 20 tablet 0  . amoxicillin-clavulanate (AUGMENTIN) 875-125 MG tablet Take 1 tablet by mouth 2 (two) times daily. (Patient not taking: Reported on 05/28/2018) 14 tablet 0  . phenazopyridine (PYRIDIUM) 200 MG tablet Take 1 tablet (200 mg total) by mouth 3 (three) times daily as needed. (Patient not taking: Reported on 05/28/2018) 6 tablet 0   No current facility-administered medications for this visit.      ALLERGIES: Hydrocodone  Family History  Problem Relation Age of Onset  . Hypertension Father   . Cancer Neg Hx   . Diabetes Neg Hx   . Heart disease Neg Hx   . Stroke Neg Hx     Social History   Socioeconomic History  . Marital status: Married    Spouse name: Not on file  . Number of children: Not on file  . Years of education: Not on file  . Highest education level: Not on file  Occupational History  . Not on file  Social Needs  . Financial resource strain: Not on file  . Food insecurity:    Worry: Not on file    Inability: Not on file  . Transportation needs:    Medical: Not on file    Non-medical: Not on file  Tobacco Use  . Smoking status: Never Smoker  . Smokeless tobacco: Never Used  Substance and Sexual Activity  . Alcohol use: No    Alcohol/week: 0.0 standard drinks  . Drug use: No  . Sexual activity: Not Currently    Partners: Male    Birth control/protection: None, Post-menopausal  Lifestyle  . Physical activity:    Days per week: Not on file    Minutes per session: Not on file  . Stress: Not on file  Relationships  . Social connections:    Talks on phone: Not on file    Gets together: Not on file    Attends religious service: Not on file    Active member of club or organization: Not on file    Attends meetings of clubs or organizations: Not on file    Relationship status: Not on file  .  Intimate partner violence:    Fear of current or ex partner: Not on file    Emotionally abused: Not on file    Physically abused: Not on file    Forced sexual activity: Not on file  Other Topics Concern  . Not on file  Social History Narrative   Lives with husband, 3 children, ages 29yo, 63yo, 2yo, works in Designer, fashion/clothing, various jobs, some exercise with walking    Review of Systems  Constitutional: Negative.   HENT: Negative.   Eyes: Negative.   Respiratory: Negative.   Cardiovascular: Negative.   Gastrointestinal: Positive for abdominal pain.  Genitourinary: Negative.   Musculoskeletal: Negative.   Skin:  Negative.   Neurological: Negative.   Endo/Heme/Allergies: Negative.   Psychiatric/Behavioral: Negative.     PHYSICAL EXAMINATION:    BP 110/68 (BP Location: Right Arm, Patient Position: Sitting, Cuff Size: Normal)   Pulse 68   Wt 123 lb 9.6 oz (56.1 kg)   LMP 10/06/2012   BMI 21.89 kg/m     General appearance: alert, cooperative and appears stated age Abdomen: soft, non-tender; non distended, no masses,  no organomegaly Incisions: clean, dry and intact without erythema   ASSESSMENT Post op s/p LAVH/LOA/BS/Cystocele repair/uterosacral ligament suspension    PLAN Doing well F/U in 2 weeks Call with any concerns   An After Visit Summary was printed and given to the patient.

## 2018-05-28 ENCOUNTER — Encounter: Payer: Self-pay | Admitting: Obstetrics and Gynecology

## 2018-05-28 ENCOUNTER — Ambulatory Visit: Payer: BLUE CROSS/BLUE SHIELD | Admitting: Obstetrics and Gynecology

## 2018-05-28 ENCOUNTER — Other Ambulatory Visit: Payer: Self-pay

## 2018-05-28 VITALS — BP 110/68 | HR 68 | Wt 123.6 lb

## 2018-05-28 DIAGNOSIS — Z90710 Acquired absence of both cervix and uterus: Secondary | ICD-10-CM

## 2018-06-05 ENCOUNTER — Other Ambulatory Visit: Payer: Self-pay | Admitting: Medical

## 2018-06-05 NOTE — Telephone Encounter (Signed)
Is this ok to refill?  

## 2018-06-09 ENCOUNTER — Ambulatory Visit: Payer: BLUE CROSS/BLUE SHIELD | Admitting: Medical

## 2018-06-09 NOTE — Progress Notes (Signed)
GYNECOLOGY  VISIT   HPI: 52 y.o.   Married Asian Not Hispanic or Latino  female   [phone] with Patient's last menstrual period was 10/06/2012.   here for 2 week follow up of abdominal pain post op. Reports she is having slight pain around her umbilicus. Denies any other symptoms. Reports she is feeling much better. One month s/p LAVH/LOA/Anterior repair/uterosacral ligament suspension and TVT. Overall doing well. Voiding well, no leakage, normal BM. Occasional spotting.  GYNECOLOGIC HISTORY: Patient's last menstrual period was 10/06/2012. Contraception: Post menopausal Menopausal hormone therapy: None       OB History    Gravida  3   Para  3   Term  3   Preterm      AB      Living  3     SAB      TAB      Ectopic      Multiple      Live Births  3              Patient Active Problem List   Diagnosis Date Noted  . Dysuria 06/10/2018  . Headache syndrome 06/10/2018  . Illiteracy 06/10/2018  . Recent major surgery 05/12/2018  . Subclinical hyperthyroidism 05/07/2018  . Muscle spasm 03/06/2018  . Chronic neck pain 03/06/2018  . Chronic upper back pain 03/06/2018  . Palpitation 03/06/2018  . Abnormal TSH 01/08/2018  . Female bladder prolapse 06/18/2017  . Dizziness 01/13/2017  . Hyperlipidemia 01/13/2017  . Weight gain 12/13/2016  . Edema 12/13/2016  . Pelvic pain in female 10/27/2015  . Chronic right shoulder pain 10/27/2015  . Encounter for health maintenance examination in adult 10/27/2015  . Special screening for malignant neoplasms, colon 10/27/2015  . Screening for breast cancer 10/27/2015    Past Medical History:  Diagnosis Date  . Chronic neck pain   . Hyperlipidemia   . Hypertension   . Shoulder pain   . Subclinical hyperthyroidism 05/07/2018    Past Surgical History:  Procedure Laterality Date  . BILATERAL SALPINGECTOMY Bilateral 05/12/2018   Procedure: BILATERAL SALPINGECTOMY;  Surgeon: Romualdo Bolk, MD;  Location: [facility];  Service: Gynecology;  Laterality: Bilateral;  . BLADDER SUSPENSION N/A 05/12/2018   Procedure: TRANSVAGINAL TAPE (TVT) PROCEDURE;  Surgeon: Romualdo Bolk, MD;  Location: [facility];  Service: Gynecology;  Laterality: N/A;  . BREAST LUMPECTOMY Right 2010  . CYST EXCISION     upper chest/ breast ([place]), 1 week hospitalization  . CYSTOCELE REPAIR N/A 05/12/2018   Procedure: ANTERIOR REPAIR (CYSTOCELE) with uterosacral ligament suspension;  Surgeon: Romualdo Bolk, MD;  Location: [facility];  Service: Gynecology;  Laterality: N/A;  . CYSTOSCOPY N/A 05/12/2018   Procedure: CYSTOSCOPY;  Surgeon: Romualdo Bolk, MD;  Location: [facility];  Service: Gynecology;  Laterality: N/A;  . JEJUNOSTOMY FEEDING TUBE     prior with surgery  . LAPAROSCOPIC VAGINAL HYSTERECTOMY WITH SALPINGECTOMY Bilateral 05/12/2018   Procedure: LAPAROSCOPIC ASSISTED VAGINAL HYSTERECTOMY WITH BILATERAL  SALPINGOOPHRECTOMY AND EXTENSIVE LYSIS OF ADHESIONS;  Surgeon: Romualdo Bolk, MD;  Location: [facility] [place];  Service: Gynecology;  Laterality: Bilateral;  . TONSILLECTOMY     Right tonsillectomy, 2wk hospitalization ([place])  . VAGINAL HYSTERECTOMY N/A 05/12/2018   Procedure: ATTEMPTED HYSTERECTOMY VAGINAL;  Surgeon: Romualdo Bolk, MD;  Location: [facility];  Service: Gynecology;  Laterality: N/A;  TVH/BS/anterior repair/ uterosacral ligament suspension/TVT/ cysto/ 3 hours/ extended recovery bed  Current Outpatient Medications  Medication Sig Dispense Refill  . docusate sodium (COLACE) 100 MG capsule Take 1 capsule (100 mg total) by mouth 2 (two) times daily. 10 capsule 0  . meloxicam (MOBIC) 15 MG tablet TAKE 1 TABLET BY MOUTH EVERY DAY 30 tablet 2  . acetaminophen (TYLENOL) 325 MG tablet Take 2 tablets (650 mg total) by mouth every 4 (four) hours as needed for mild pain. (Patient not taking: Reported on  06/11/2018) 30 tablet 1  . ibuprofen (ADVIL,MOTRIN) 800 MG tablet Take 1 tablet (800 mg total) by mouth every 8 (eight) hours as needed. (Patient not taking: Reported on 06/11/2018) 30 tablet 1  . phenazopyridine (PYRIDIUM) 200 MG tablet Take 1 tablet (200 mg total) by mouth 3 (three) times daily as needed. (Patient not taking: Reported on 06/11/2018) 6 tablet 0  . traMADol (ULTRAM) 50 MG tablet One tablet po q 4-6 hours prn pain (Patient not taking: Reported on 06/11/2018) 20 tablet 0   No current facility-administered medications for this visit.      ALLERGIES: Hydrocodone  Family History  Problem Relation Age of Onset  . Hypertension Father   . Cancer Neg Hx   . Diabetes Neg Hx   . Heart disease Neg Hx   . Stroke Neg Hx     Social History   Socioeconomic History  . Marital status: Married    Spouse name: Not on file  . Number of children: Not on file  . Years of education: Not on file  . Highest education level: Not on file  Occupational History  . Not on file  Social Needs  . Financial resource strain: Not on file  . Food insecurity:    Worry: Not on file    Inability: Not on file  . Transportation needs:    Medical: Not on file    Non-medical: Not on file  Tobacco Use  . Smoking status: Never Smoker  . Smokeless tobacco: Never Used  Substance and Sexual Activity  . Alcohol use: No    Alcohol/week: 0.0 standard drinks  . Drug use: No  . Sexual activity: Not Currently    Partners: Male    Birth control/protection: None, Post-menopausal  Lifestyle  . Physical activity:    Days per week: Not on file    Minutes per session: Not on file  . Stress: Not on file  Relationships  . Social connections:    Talks on phone: Not on file    Gets together: Not on file    Attends religious service: Not on file    Active member of club or organization: Not on file    Attends meetings of clubs or organizations: Not on file    Relationship status: Not on file  . Intimate  partner violence:    Fear of current or ex partner: Not on file    Emotionally abused: Not on file    Physically abused: Not on file    Forced sexual activity: Not on file  Other Topics Concern  . Not on file  Social History Narrative   Lives with husband, 3 children, ages 70yo, 48yo, 58yo, works in Designer, fashion/clothing, various jobs, some exercise with walking    Review of Systems  Constitutional: Negative.   HENT: Negative.   Eyes: Negative.   Respiratory: Negative.   Cardiovascular: Negative.   Gastrointestinal: Negative.   Genitourinary:       Pain around umbilicus  Musculoskeletal: Negative.   Skin: Negative.   Neurological: Negative.  Endo/Heme/Allergies: Negative.   Psychiatric/Behavioral: Negative.     PHYSICAL EXAMINATION:    BP 122/86 (BP Location: Right Arm, Patient Position: Sitting, Cuff Size: Normal)   Pulse 64   Wt 133 lb (60.3 kg)   LMP 10/06/2012   BMI 22.83 kg/m     General appearance: alert, cooperative and appears stated age Abdomen: soft, non-tender; non distended, no masses,  no organomegaly  Pelvic: External genitalia:  no lesions              Urethra:  normal appearing urethra with no masses, tenderness or lesions              Bartholins and Skenes: normal                 Vagina: atrophic, healing well, slightly friable at the cuff, treated with silver nitrate. Stitches intact              Cervix: absent              Bimanual Exam:  Uterus:  uterus absent              Adnexa: no mass, fullness, tenderness               Chaperone was present for exam.  ASSESSMENT 1 month S/P LAVH/LOA/Anterior repair/uterosacral ligament suspension and TVT. Doing well    PLAN F/U in one month Call with any concerns   An After Visit Summary was printed and given to the patient.

## 2018-06-10 ENCOUNTER — Ambulatory Visit (INDEPENDENT_AMBULATORY_CARE_PROVIDER_SITE_OTHER): Payer: BLUE CROSS/BLUE SHIELD | Admitting: Medical

## 2018-06-10 VITALS — Temp 97.6°F | Resp 16 | Ht 64.0 in | Wt 131.6 lb

## 2018-06-10 DIAGNOSIS — Z9889 Other specified postprocedural states: Secondary | ICD-10-CM

## 2018-06-10 DIAGNOSIS — R300 Dysuria: Secondary | ICD-10-CM | POA: Diagnosis not present

## 2018-06-10 DIAGNOSIS — R42 Dizziness and giddiness: Secondary | ICD-10-CM | POA: Diagnosis not present

## 2018-06-10 DIAGNOSIS — Z550 Illiteracy and low-level literacy: Secondary | ICD-10-CM

## 2018-06-10 DIAGNOSIS — G4489 Other headache syndrome: Secondary | ICD-10-CM

## 2018-06-10 LAB — POCT URINALYSIS DIP (PROADVANTAGE DEVICE)
BILIRUBIN UA: NEGATIVE mg/dL
Bilirubin, UA: NEGATIVE
GLUCOSE UA: NEGATIVE mg/dL
Nitrite, UA: NEGATIVE
Protein Ur, POC: NEGATIVE mg/dL
SPECIFIC GRAVITY, URINE: 1.005
Urobilinogen, Ur: NEGATIVE
pH, UA: 6 (ref 5.0–8.0)

## 2018-06-10 NOTE — Progress Notes (Signed)
Subjective: Chief Complaint  Patient presents with  . stomach pain    dizzy headache    Here for not feeling well.   Accompanied by her daughter Becky Goodwin who interprets today  She has several concerns.   Her cholesterol has been elevated in the past.  Wants to know when she needs her cholesterol rechecked  Wants to check her BP today  Has form she wants completed.  She needs a form completed for homeland security for certification of disability exception to get citizenship.  Per daughter has very limited education in [place].  She actually says she has never been to school.   Cannot write or read Nepali, and certainly can't read or write english.    She had a total hysterectomy about a month ago.  Gradually has been improving with lower belly pain but still feels weak.  In the past 3 days she has had a little bit of headache and dizziness.  Some nausea.  She denies vomiting, diarrhea, fever, no URI symptoms, no bowel issues, no reported urinary issues, no rash, no myalgias.  No chest pain or shortness of breath.   Past Medical History:  Diagnosis Date  . Chronic neck pain   . Hyperlipidemia   . Hypertension   . Shoulder pain   . Subclinical hyperthyroidism 05/07/2018   Current Outpatient Medications on File Prior to Visit  Medication Sig Dispense Refill  . acetaminophen (TYLENOL) 325 MG tablet Take 2 tablets (650 mg total) by mouth every 4 (four) hours as needed for mild pain. 30 tablet 1  . ibuprofen (ADVIL,MOTRIN) 800 MG tablet Take 1 tablet (800 mg total) by mouth every 8 (eight) hours as needed. 30 tablet 1  . docusate sodium (COLACE) 100 MG capsule Take 1 capsule (100 mg total) by mouth 2 (two) times daily. (Patient not taking: Reported on 06/10/2018) 10 capsule 0  . meloxicam (MOBIC) 15 MG tablet TAKE 1 TABLET BY MOUTH EVERY DAY (Patient not taking: Reported on 06/10/2018) 30 tablet 2  . phenazopyridine (PYRIDIUM) 200 MG tablet Take 1 tablet (200 mg total) by mouth 3 (three) times  daily as needed. (Patient not taking: Reported on 05/28/2018) 6 tablet 0  . traMADol (ULTRAM) 50 MG tablet One tablet po q 4-6 hours prn pain (Patient not taking: Reported on 06/10/2018) 20 tablet 0   No current facility-administered medications on file prior to visit.     Objective: Temp 97.6 F (36.4 C) (Oral)   Resp 16   Ht 5\' 4"  (1.626 m)   Wt 131 lb 9.6 oz (59.7 kg)   LMP 10/06/2012   SpO2 98%   BMI 22.59 kg/m   BP Readings from Last 3 Encounters:  05/28/18 110/68  05/21/18 114/82  05/19/18 110/68   Wt Readings from Last 3 Encounters:  06/10/18 131 lb 9.6 oz (59.7 kg)  05/28/18 123 lb 9.6 oz (56.1 kg)  05/21/18 132 lb 6.4 oz (60.1 kg)      General appearance: alert, no distress, WD/WN,  HEENT: normocephalic, sclerae anicteric, PERRLA, EOMi, nares patent, no discharge or erythema, pharynx normal Oral cavity: MMM, no lesions Neck: supple, no lymphadenopathy, no thyromegaly, no masses Heart: RRR, normal S1, S2, no murmurs Lungs: CTA bilaterally, no wheezes, rhonchi, or rales Abdomen: +bs, soft, mild lower abdominal tenderness, otherwise  non tender, non distended, no masses, no hepatomegaly, no splenomegaly Back: non tender Extremities: no edema, no cyanosis, no clubbing Pulses: 2+ symmetric, upper and lower extremities, normal cap refill Neurological: alert, oriented x  3, CN2-12 intact, strength normal upper extremities and lower extremities, sensation normal throughout, DTRs 2+ throughout, no cerebellar signs, gait normal Psychiatric: normal affect, behavior normal, pleasant     Assessment: Encounter Diagnoses  Name Primary?  . Dysuria Yes  . Headache syndrome   . Dizziness   . Recent major surgery   . Illiteracy      Plan: Her exam is unremarkable other than lower belly tenderness today which is expected given the recent surgery  Urinalysis was abnormal so we will send urine for culture.  Based on her current water intake she probably needs to drink  more water in general.  We discussed trying to aim for 64 ounces daily or keeping urine clear appearing.  We discussed eating 3 times a day, avoid fasting.  She can use Tylenol as needed  I advised that we will try to work on her form which requires MD signature not a PA signature and documentation of illiteracy as reason for the disability exception   Becky Goodwin was seen today for stomach pain.  Diagnoses and all orders for this visit:  Dysuria -     POCT Urinalysis DIP (Proadvantage Device) -     Urine Culture  Headache syndrome  Dizziness  Recent major surgery  Illiteracy

## 2018-06-11 ENCOUNTER — Ambulatory Visit (INDEPENDENT_AMBULATORY_CARE_PROVIDER_SITE_OTHER): Payer: BLUE CROSS/BLUE SHIELD | Admitting: Obstetrics and Gynecology

## 2018-06-11 ENCOUNTER — Encounter: Payer: Self-pay | Admitting: Obstetrics and Gynecology

## 2018-06-11 VITALS — BP 122/86 | HR 64 | Wt 133.0 lb

## 2018-06-11 DIAGNOSIS — Z90710 Acquired absence of both cervix and uterus: Secondary | ICD-10-CM

## 2018-06-11 LAB — URINE CULTURE

## 2018-06-12 ENCOUNTER — Telehealth: Payer: Self-pay | Admitting: Medical

## 2018-06-12 ENCOUNTER — Other Ambulatory Visit: Payer: Self-pay | Admitting: Medical

## 2018-06-12 NOTE — Telephone Encounter (Signed)
Patient's daughter notified to pick up completed forms on Monday.

## 2018-06-12 NOTE — Progress Notes (Signed)
Form ready

## 2018-06-12 NOTE — Telephone Encounter (Signed)
Form ready for citizenship clearance.    Copy and return her the originals

## 2018-06-18 ENCOUNTER — Ambulatory Visit: Payer: BLUE CROSS/BLUE SHIELD | Admitting: Endocrinology

## 2018-06-18 ENCOUNTER — Encounter: Payer: Self-pay | Admitting: Endocrinology

## 2018-06-18 VITALS — BP 128/70 | HR 62 | Ht 64.0 in | Wt 132.0 lb

## 2018-06-18 DIAGNOSIS — R7989 Other specified abnormal findings of blood chemistry: Secondary | ICD-10-CM

## 2018-06-18 MED ORDER — METHIMAZOLE 10 MG PO TABS: 10.0000 mg | ORAL_TABLET | Freq: Three times a day (TID) | ORAL | 5 refills | Status: DC

## 2018-06-18 NOTE — Patient Instructions (Addendum)
I have sent a prescription to your pharmacy, to slow the thyroid. If ever you have fever while taking methimazole, stop it and call us, even if the reason is obvious, because of the risk of a rare side-effect. Please come back for a follow-up appointment in 3-4 weeks. In the future, you can change your mind, and take 1 of the other treatment options.   ???? ???????? ????? ?????? ???? ?????? ?????????? ?????????????? ?????? ??? ??? ???????? ????????? ???? ???? ????? ???? ? ???, ????? ?????????? ? ??????? ?? ?????????, ??? ???? ?????? ? ??? ???, ?????? ???? ???????? ??????? ????? 3-4- weeks ??????? ???-?? ?????????????? ???? ???? ????????? ???????? ?????? ????? ??? ???????? ???? ??????????, ? ? ???? ????? ??????????? ??? ???????????       Hyperthyroidism Hyperthyroidism is when the thyroid is too active (overactive). Your thyroid is a large gland that is located in your neck. The thyroid helps to control how your body uses food (metabolism). When your thyroid is overactive, it produces too much of a hormone called thyroxine. What are the causes? Causes of hyperthyroidism may include:  Graves disease. This is when your immune system attacks the thyroid gland. This is the most common cause.  Inflammation of the thyroid gland.  Tumor in the thyroid gland or somewhere else.  Excessive use of thyroid medicines, including: ? Prescription thyroid supplement. ? Herbal supplements that mimic thyroid hormones.  Solid or fluid-filled lumps within your thyroid gland (thyroid nodules).  Excessive ingestion of iodine.  What increases the risk?  Being female.  Having a family history of thyroid conditions. What are the signs or symptoms? Signs and symptoms of hyperthyroidism may include:  Nervousness.  Inability to tolerate heat.  Unexplained weight loss.  Diarrhea.  Change in the texture of hair or skin.  Heart skipping beats or making extra beats.  Rapid heart rate.  Loss of  menstruation.  Shaky hands.  Fatigue.  Restlessness.  Increased appetite.  Sleep problems.  Enlarged thyroid gland or nodules.  How is this diagnosed? Diagnosis of hyperthyroidism may include:  Medical history and physical exam.  Blood tests.  Ultrasound tests.  How is this treated? Treatment may include:  Medicines to control your thyroid.  Surgery to remove your thyroid.  Radiation therapy.  Follow these instructions at home:  Take medicines only as directed by your health care provider.  Do not use any tobacco products, including cigarettes, chewing tobacco, or electronic cigarettes. If you need help quitting, ask your health care provider.  Do not exercise or do physical activity until your health care provider approves.  Keep all follow-up appointments as directed by your health care provider. This is important. Contact a health care provider if:  Your symptoms do not get better with treatment.  You have fever.  You are taking thyroid replacement medicine and you: ? Have depression. ? Feel mentally and physically slow. ? Have weight gain. Get help right away if:  You have decreased alertness or a change in your awareness.  You have abdominal pain.  You feel dizzy.  You have a rapid heartbeat.  You have an irregular heartbeat. This information is not intended to replace advice given to you by your health care provider. Make sure you discuss any questions you have with your health care provider. ???????????????? ???????????????? ?? ????? ?? ??????? ??? ?????? (?????????) ?????? ??????? ??????? ???? ????? ?? ??? ??????? ??????? ??????? ?? ????????? ???? ??????? ?????? ???? (??????????) ?? ????? ???? ???? ????????? ???? ????? ?????? ?? ??????? ??????? ????????? ?????, ???? ?????????? ????? ???????? ???? ??????? ?????? ?? ??????? ???? ?????????????????? ?????????: ??????? ???? ?? ?? ????? ?? ??????? ?????????? ????????? ??????? ????????? ?????? ?????? ??  ?? ????? ?????? ???? ??? ??????? ???????? ????? ??????? ?????? ?? ??? ??? ??????? ??????? ????????? ??????? ??????, ????: ?????????????? ??????? ????? ??????? ??????????? ????? ????? ?????????? ??????? ??????? ?????? (??????? ??????????) ????? ??? ?? ??? ???????? ?????? ???????? ???????? ??????? ???????? ?? ???? ????? ????? ???? ??????? ????????? ????????? ?????? ?????? ????? ?? ????? ?? ???  Hyperthyroidism ?? ????? ? ???????? ?????? ??? ????: ????? ????? ??? ???? ????????? ?????????? ??? ??????? ????? ???? ?? ?????? ??????? ???????? ?????????? ????? ????? ??? ?? ???????? ???? ??????? ????? ???? ??? ????? ?????? ????? ?????? ??? ???? ??????? ??? ?????? ?????? ?????????? ??????? ?????? ?? ?????????? ??????? ?? ???? ????? ??????? ????????????????? ??????? ?????? ??? ????: ???????? ?????? ? ??????? ???????? ???? ???????? ????????????? ??????????? ???? ????? ???? ??????? ??????? ?????? ??? ????: ??????? ??????? ????????? ???? ???????? ?????? ??????? ??????? ???? ?????? ?????????? ?????? ????????? ?? ??????????? ???? ????? ?????????: ?????? ????????? ???? ??????? ?????? ???????? ?????? ???? ????????? ???? ??? ?????????? ?????????? ?????? ??????????, ??????, ?????wing ??????, ?? ???????????? ?????? ????? ??? ???????? ????? ????? ???????, ??????? ????????? ???? ?????????? ??????????? ?????? ????????? ???? ????????? ??????? ??????? ??????? ?? ??????? ??????? ??????????? ??? ????????? ???? ??????? ?????? ????????? ?????? ???????????? ??????????? ?? ???????????? ?? ?? ????????? ???? ?????????? ??????? ????????? ???: ?????? ???????? ??????? ??? ?????? ??????? ??????? ????? ???? ?? ????? ??????? ??????????? ???? ???? ????????? ? ?????: ???????? ?? ?????? ? ??????? ????? ????? ????? ?????????? ??? ????? ?? ????? ?? ????? ???????? ???: ????? ??????? ?? ????? ???????? ???????? ???????? ????????? ??????? ??? ?????? ?? ??????? ????? ???? ?????? ???? ?????? ????? ?? ???????? ??????? ?????? ???? ?? ?? ??????? ?????? ????????? ????  ??????? ?????? ????? ?????? ??????? ???????? ??? ?? ??????? ????????? ?? ??????? ??????? ????????? ???? ??????? ??? ???? ????????? ???? ???? ?????????

## 2018-06-18 NOTE — Progress Notes (Signed)
Subjective:    Patient ID: Becky Goodwin, female    DOB: 1966-07-08, 52 y.o.   MRN: [id number]  HPI Pt is referred by Dr Oscar La, for hyperthyroidism.  Pt was dx'ed with hyperthyroidism in 2014.  she has never been on therapy for this.  she has never had XRT to the anterior neck, or thyroid surgery.  she has never had thyroid imaging.  she does not consume kelp or any other non-prescribed thyroid medication.  she has never been on amiodarone.  She has slight palpitations in the chest, but no assoc fatigue.   Past Medical History:  Diagnosis Date  . Chronic neck pain   . Hyperlipidemia   . Hypertension   . Shoulder pain   . Subclinical hyperthyroidism 05/07/2018    Past Surgical History:  Procedure Laterality Date  . BILATERAL SALPINGECTOMY Bilateral 05/12/2018   Procedure: BILATERAL SALPINGECTOMY;  Surgeon: Romualdo Bolk, MD;  Location: [facility];  Service: Gynecology;  Laterality: Bilateral;  . BLADDER SUSPENSION N/A 05/12/2018   Procedure: TRANSVAGINAL TAPE (TVT) PROCEDURE;  Surgeon: Romualdo Bolk, MD;  Location: [facility];  Service: Gynecology;  Laterality: N/A;  . BREAST LUMPECTOMY Right 2010  . CYST EXCISION     upper chest/ breast ([facility]), 1 week hospitalization  . CYSTOCELE REPAIR N/A 05/12/2018   Procedure: ANTERIOR REPAIR (CYSTOCELE) with uterosacral ligament suspension;  Surgeon: Romualdo Bolk, MD;  Location: [facility];  Service: Gynecology;  Laterality: N/A;  . CYSTOSCOPY N/A 05/12/2018   Procedure: CYSTOSCOPY;  Surgeon: Romualdo Bolk, MD;  Location: [facility]  Service: Gynecology;  Laterality: N/A;  . JEJUNOSTOMY FEEDING TUBE     prior with surgery  . LAPAROSCOPIC VAGINAL HYSTERECTOMY WITH SALPINGECTOMY Bilateral 05/12/2018   Procedure: LAPAROSCOPIC ASSISTED VAGINAL HYSTERECTOMY WITH BILATERAL  SALPINGOOPHRECTOMY AND EXTENSIVE LYSIS OF ADHESIONS;  Surgeon: Romualdo Bolk, MD;   Location: [facility];  Service: Gynecology;  Laterality: Bilateral;  . TONSILLECTOMY     Right tonsillectomy, 2wk hospitalization ([facility])  . VAGINAL HYSTERECTOMY N/A 05/12/2018   Procedure: ATTEMPTED HYSTERECTOMY VAGINAL;  Surgeon: Romualdo Bolk, MD;  Location: [facility];  Service: Gynecology;  Laterality: N/A;  TVH/BS/anterior repair/ uterosacral ligament suspension/TVT/ cysto/ 3 hours/ extended recovery bed    Social History   Socioeconomic History  . Marital status: Married    Spouse name: Not on file  . Number of children: Not on file  . Years of education: Not on file  . Highest education level: Not on file  Occupational History  . Not on file  Social Needs  . Financial resource strain: Not on file  . Food insecurity:    Worry: Not on file    Inability: Not on file  . Transportation needs:    Medical: Not on file    Non-medical: Not on file  Tobacco Use  . Smoking status: Never Smoker  . Smokeless tobacco: Never Used  Substance and Sexual Activity  . Alcohol use: No    Alcohol/week: 0.0 standard drinks  . Drug use: No  . Sexual activity: Not Currently    Partners: Male    Birth control/protection: None, Post-menopausal  Lifestyle  . Physical activity:    Days per week: Not on file    Minutes per session: Not on file  . Stress: Not on file  Relationships  . Social connections:    Talks on phone: Not on file    Gets together: Not  on file    Attends religious service: Not on file    Active member of club or organization: Not on file    Attends meetings of clubs or organizations: Not on file    Relationship status: Not on file  . Intimate partner violence:    Fear of current or ex partner: Not on file    Emotionally abused: Not on file    Physically abused: Not on file    Forced sexual activity: Not on file  Other Topics Concern  . Not on file  Social History Narrative   Lives with husband, 3 children, ages 40yo, 40yo,  74yo, works in Designer, fashion/clothing, various jobs, some exercise with walking    Current Outpatient Medications on File Prior to Visit  Medication Sig Dispense Refill  . acetaminophen (TYLENOL) 325 MG tablet Take 2 tablets (650 mg total) by mouth every 4 (four) hours as needed for mild pain. (Patient not taking: Reported on 06/11/2018) 30 tablet 1  . docusate sodium (COLACE) 100 MG capsule Take 1 capsule (100 mg total) by mouth 2 (two) times daily. (Patient not taking: Reported on 06/18/2018) 10 capsule 0  . ibuprofen (ADVIL,MOTRIN) 800 MG tablet Take 1 tablet (800 mg total) by mouth every 8 (eight) hours as needed. (Patient not taking: Reported on 06/11/2018) 30 tablet 1  . meloxicam (MOBIC) 15 MG tablet TAKE 1 TABLET BY MOUTH EVERY DAY (Patient not taking: Reported on 06/18/2018) 30 tablet 2  . phenazopyridine (PYRIDIUM) 200 MG tablet Take 1 tablet (200 mg total) by mouth 3 (three) times daily as needed. (Patient not taking: Reported on 06/11/2018) 6 tablet 0  . traMADol (ULTRAM) 50 MG tablet One tablet po q 4-6 hours prn pain (Patient not taking: Reported on 06/11/2018) 20 tablet 0   No current facility-administered medications on file prior to visit.     Allergies  Allergen Reactions  . Hydrocodone Nausea And Vomiting    Family History  Problem Relation Age of Onset  . Hypertension Father   . Cancer Neg Hx   . Diabetes Neg Hx   . Heart disease Neg Hx   . Stroke Neg Hx   . Thyroid disease Neg Hx     BP 128/70 (BP Location: Left Arm, Patient Position: Sitting, Cuff Size: Normal)   Pulse 62   Ht 5\' 4"  (1.626 m)   Wt 132 lb (59.9 kg)   LMP 10/06/2012   SpO2 98%   BMI 22.66 kg/m    Review of Systems  denies weight loss, headache, hoarseness, diplopia, sob, diarrhea, polyuria, muscle weakness, edema, excessive diaphoresis, easy bruising, and rhinorrhea. She has anxiety and heat intolerance     Objective:   Physical Exam VS: see vs page GEN: no distress HEAD: head: no deformity eyes:  no periorbital swelling, no proptosis external nose and ears are normal mouth: no lesion seen NECK: thyroid is 2-3 times normal size (R>L), with irreg surface, but no palpable nodule.   CHEST WALL: no deformity LUNGS: clear to auscultation CV: reg rate and rhythm, no murmur ABD: abdomen is soft, nontender.  no hepatosplenomegaly.  not distended.  no hernia MUSCULOSKELETAL: muscle bulk and strength are grossly normal.  no obvious joint swelling.  gait is normal and steady EXTEMITIES: no deformity.  no edema PULSES: no carotid bruit NEURO:  cn 2-12 grossly intact.   readily moves all 4's.  sensation is intact to touch on all 4's.  no tremor SKIN:  Normal texture and temperature.  No rash or  suspicious lesion is visible.  Not diaphoretic NODES:  None palpable at the neck PSYCH: alert, well-oriented.  Does not appear anxious nor depressed.     Lab Results  Component Value Date   TSH 0.062 (L) 05/07/2018   T4TOTAL 9.9 05/07/2018   I have reviewed outside records, and summarized:  Pt was noted to have suppressed TSH, and referred here.  She was seen for postop GYN visit, and was making good progress.      Assessment & Plan:  Hyperthyroidism, new to me, uncertain etiology   Patient Instructions  I have sent a prescription to your pharmacy, to slow the thyroid. If ever you have fever while taking methimazole, stop it and call us, even if the reason is obvious, because of the risk of a rare side-effect. Please come back for a follow-up appointment in 3-4 weeks. In the future, you can change your mind, and take 1 of the other treatment options.   ???? ???????? ????? ?????? ???? ?????? ?????????? ?????????????? ?????? ??? ??? ???????? ????????? ???? ???? ????? ???? ? ???, ????? ?????????? ? ??????? ?? ?????????, ??? ???? ?????? ? ??? ???, ?????? ???? ???????? ??????? ????? 3-4- weeks ??????? ???-?? ?????????????? ???? ???? ????????? ???????? ?????? ????? ??? ???????? ???? ??????????, ?  ? ???? ????? ??????????? ??? ???????????       Hyperthyroidism Hyperthyroidism is when the thyroid is too active (overactive). Your thyroid is a large gland that is located in your neck. The thyroid helps to control how your body uses food (metabolism). When your thyroid is overactive, it produces too much of a hormone called thyroxine. What are the causes? Causes of hyperthyroidism may include:  Graves disease. This is when your immune system attacks the thyroid gland. This is the most common cause.  Inflammation of the thyroid gland.  Tumor in the thyroid gland or somewhere else.  Excessive use of thyroid medicines, including: ? Prescription thyroid supplement. ? Herbal supplements that mimic thyroid hormones.  Solid or fluid-filled lumps within your thyroid gland (thyroid nodules).  Excessive ingestion of iodine.  What increases the risk?  Being female.  Having a family history of thyroid conditions. What are the signs or symptoms? Signs and symptoms of hyperthyroidism may include:  Nervousness.  Inability to tolerate heat.  Unexplained weight loss.  Diarrhea.  Change in the texture of hair or skin.  Heart skipping beats or making extra beats.  Rapid heart rate.  Loss of menstruation.  Shaky hands.  Fatigue.  Restlessness.  Increased appetite.  Sleep problems.  Enlarged thyroid gland or nodules.  How is this diagnosed? Diagnosis of hyperthyroidism may include:  Medical history and physical exam.  Blood tests.  Ultrasound tests.  How is this treated? Treatment may include:  Medicines to control your thyroid.  Surgery to remove your thyroid.  Radiation therapy.  Follow these instructions at home:  Take medicines only as directed by your health care provider.  Do not use any tobacco products, including cigarettes, chewing tobacco, or electronic cigarettes. If you need help quitting, ask your health care provider.  Do not exercise  or do physical activity until your health care provider approves.  Keep all follow-up appointments as directed by your health care provider. This is important. Contact a health care provider if:  Your symptoms do not get better with treatment.  You have fever.  You are taking thyroid replacement medicine and you: ? Have depression. ? Feel mentally and physically slow. ? Have weight gain. Get help right  away if:  You have decreased alertness or a change in your awareness.  You have abdominal pain.  You feel dizzy.  You have a rapid heartbeat.  You have an irregular heartbeat. This information is not intended to replace advice given to you by your health care provider. Make sure you discuss any questions you have with your health care provider. ???????????????? ???????????????? ?? ????? ?? ??????? ??? ?????? (?????????) ?????? ??????? ??????? ???? ????? ?? ??? ??????? ??????? ??????? ?? ????????? ???? ??????? ?????? ???? (??????????) ?? ????? ???? ???? ????????? ???? ????? ?????? ?? ??????? ??????? ????????? ?????, ???? ?????????? ????? ???????? ???? ??????? ?????? ?? ??????? ???? ?????????????????? ?????????: ??????? ???? ?? ?? ????? ?? ??????? ?????????? ????????? ??????? ????????? ?????? ?????? ?? ?? ????? ?????? ???? ??? ??????? ???????? ????? ??????? ?????? ?? ??? ??? ??????? ??????? ????????? ??????? ??????, ????: ?????????????? ??????? ????? ??????? ??????????? ????? ????? ?????????? ??????? ??????? ?????? (??????? ??????????) ????? ??? ?? ??? ???????? ?????? ???????? ???????? ??????? ???????? ?? ???? ????? ????? ???? ??????? ????????? ????????? ?????? ?????? ????? ?? ????? ?? ??? Hyperthyroidism ?? ????? ? ???????? ?????? ??? ????: ????? ????? ??? ???? ????????? ?????????? ??? ??????? ????? ???? ?? ?????? ??????? ???????? ?????????? ????? ????? ??? ?? ???????? ???? ??????? ????? ???? ??? ????? ?????? ????? ?????? ??? ???? ??????? ??? ?????? ?????? ?????????? ??????? ?????? ??  ?????????? ??????? ?? ???? ????? ??????? ????????????????? ??????? ?????? ??? ????: ???????? ?????? ? ??????? ???????? ???? ???????? ????????????? ??????????? ???? ????? ???? ??????? ??????? ?????? ??? ????: ??????? ??????? ????????? ???? ???????? ?????? ??????? ??????? ???? ?????? ?????????? ?????? ????????? ?? ??????????? ???? ????? ?????????: ?????? ????????? ???? ??????? ?????? ???????? ?????? ???? ????????? ???? ??? ?????????? ?????????? ?????? ??????????, ??????, ?????wing ??????, ?? ???????????? ?????? ????? ??? ???????? ????? ????? ???????, ??????? ????????? ???? ?????????? ??????????? ?????? ????????? ???? ????????? ??????? ??????? ??????? ?? ??????? ??????? ??????????? ??? ????????? ???? ??????? ?????? ????????? ?????? ???????????? ??????????? ?? ???????????? ?? ?? ????????? ???? ?????????? ??????? ????????? ???: ?????? ???????? ??????? ??? ?????? ??????? ??????? ????? ???? ?? ????? ??????? ??????????? ???? ???? ????????? ? ?????: ???????? ?? ?????? ? ??????? ????? ????? ????? ?????????? ??? ????? ?? ????? ?? ????? ???????? ???: ????? ??????? ?? ????? ???????? ???????? ???????? ????????? ??????? ??? ?????? ?? ??????? ????? ???? ?????? ???? ?????? ????? ?? ???????? ??????? ?????? ???? ?? ?? ??????? ?????? ????????? ???? ??????? ?????? ????? ?????? ??????? ???????? ??? ?? ??????? ????????? ?? ??????? ??????? ????????? ???? ??????? ??? ???? ????????? ???? ???? ?????????

## 2018-07-16 NOTE — Progress Notes (Signed)
GYNECOLOGY  VISIT   HPI: 52 y.o.   Married Asian Not Hispanic or Latino  female   [phone] with Patient's last menstrual period was 10/06/2012.   here for follow up, she is 2 months s/p LAVH/LOA/Anterior repair/uterosacral ligament suspension and TVT.  She is doing well, no more pain. For the last 2 weeks she has to bend over to pee, feels she is emptying her bladder. For the last 2 weeks she has been voiding frequently. She does feel empty. No leakage, no pain, no bleeding.   GYNECOLOGIC HISTORY: Patient's last menstrual period was 10/06/2012. Contraception: hysterectomy  Menopausal hormone therapy: none         OB History    Gravida  3   Para  3   Term  3   Preterm      AB      Living  3     SAB      TAB      Ectopic      Multiple      Live Births  3              Patient Active Problem List   Diagnosis Date Noted  . Dysuria 06/10/2018  . Headache syndrome 06/10/2018  . Illiteracy 06/10/2018  . Recent major surgery 05/12/2018  . Subclinical hyperthyroidism 05/07/2018  . Muscle spasm 03/06/2018  . Chronic neck pain 03/06/2018  . Chronic upper back pain 03/06/2018  . Palpitation 03/06/2018  . Abnormal TSH 01/08/2018  . Female bladder prolapse 06/18/2017  . Dizziness 01/13/2017  . Hyperlipidemia 01/13/2017  . Weight gain 12/13/2016  . Edema 12/13/2016  . Pelvic pain in female 10/27/2015  . Chronic right shoulder pain 10/27/2015  . Encounter for health maintenance examination in adult 10/27/2015  . Special screening for malignant neoplasms, colon 10/27/2015  . Screening for breast cancer 10/27/2015    Past Medical History:  Diagnosis Date  . Chronic neck pain   . Hyperlipidemia   . Hypertension   . Shoulder pain   . Subclinical hyperthyroidism 05/07/2018    Past Surgical History:  Procedure Laterality Date  . BILATERAL SALPINGECTOMY Bilateral 05/12/2018   Procedure: BILATERAL SALPINGECTOMY;  Surgeon: Romualdo Bolk, MD;  Location: [facility];  Service: Gynecology;  Laterality: Bilateral;  . BLADDER SUSPENSION N/A 05/12/2018   Procedure: TRANSVAGINAL TAPE (TVT) PROCEDURE;  Surgeon: Romualdo Bolk, MD;  Location: [facility];  Service: Gynecology;  Laterality: N/A;  . BREAST LUMPECTOMY Right 2010  . CYST EXCISION     upper chest/ breast ([facility]), 1 week hospitalization  . CYSTOCELE REPAIR N/A 05/12/2018   Procedure: ANTERIOR REPAIR (CYSTOCELE) with uterosacral ligament suspension;  Surgeon: Romualdo Bolk, MD;  Location: [facility];  Service: Gynecology;  Laterality: N/A;  . CYSTOSCOPY N/A 05/12/2018   Procedure: CYSTOSCOPY;  Surgeon: Romualdo Bolk, MD;  Location: [facility];  Service: Gynecology;  Laterality: N/A;  . JEJUNOSTOMY FEEDING TUBE     prior with surgery  . LAPAROSCOPIC VAGINAL HYSTERECTOMY WITH SALPINGECTOMY Bilateral 05/12/2018   Procedure: LAPAROSCOPIC ASSISTED VAGINAL HYSTERECTOMY WITH BILATERAL  SALPINGOOPHRECTOMY AND EXTENSIVE LYSIS OF ADHESIONS;  Surgeon: Romualdo Bolk, MD;  Location: [facility];  Service: Gynecology;  Laterality: Bilateral;  . TONSILLECTOMY     Right tonsillectomy, 2wk hospitalization ([facility])  . VAGINAL HYSTERECTOMY N/A 05/12/2018   Procedure: ATTEMPTED HYSTERECTOMY VAGINAL;  Surgeon: Romualdo Bolk, MD;  Location: [facility];  Service: Gynecology;  Laterality: N/A;  TVH/BS/anterior repair/ uterosacral ligament suspension/TVT/ cysto/ 3 hours/ extended recovery bed    Current Outpatient Medications  Medication Sig Dispense Refill  . docusate sodium (COLACE) 100 MG capsule Take 1 capsule (100 mg total) by mouth 2 (two) times daily. 10 capsule 0  . ibuprofen (ADVIL,MOTRIN) 800 MG tablet Take 1 tablet (800 mg total) by mouth every 8 (eight) hours as needed. 30 tablet 1   No current facility-administered medications for this visit.      ALLERGIES: Hydrocodone  Family  History  Problem Relation Age of Onset  . Hypertension Father   . Cancer Neg Hx   . Diabetes Neg Hx   . Heart disease Neg Hx   . Stroke Neg Hx   . Thyroid disease Neg Hx     Social History   Socioeconomic History  . Marital status: Married    Spouse name: Not on file  . Number of children: Not on file  . Years of education: Not on file  . Highest education level: Not on file  Occupational History  . Not on file  Social Needs  . Financial resource strain: Not on file  . Food insecurity:    Worry: Not on file    Inability: Not on file  . Transportation needs:    Medical: Not on file    Non-medical: Not on file  Tobacco Use  . Smoking status: Never Smoker  . Smokeless tobacco: Never Used  Substance and Sexual Activity  . Alcohol use: No    Alcohol/week: 0.0 standard drinks  . Drug use: No  . Sexual activity: Not Currently    Partners: Male    Birth control/protection: None, Post-menopausal  Lifestyle  . Physical activity:    Days per week: Not on file    Minutes per session: Not on file  . Stress: Not on file  Relationships  . Social connections:    Talks on phone: Not on file    Gets together: Not on file    Attends religious service: Not on file    Active member of club or organization: Not on file    Attends meetings of clubs or organizations: Not on file    Relationship status: Not on file  . Intimate partner violence:    Fear of current or ex partner: Not on file    Emotionally abused: Not on file    Physically abused: Not on file    Forced sexual activity: Not on file  Other Topics Concern  . Not on file  Social History Narrative   Lives with husband, 3 children, ages 42yo, 74yo, 76yo, works in Designer, fashion/clothing, various jobs, some exercise with walking    Review of Systems  Constitutional: Negative.   HENT: Negative.   Eyes: Negative.   Respiratory: Negative.   Cardiovascular: Negative.   Gastrointestinal: Negative.   Genitourinary:       Difficulty  voiding at times  Musculoskeletal: Negative.   Skin: Negative.   Neurological: Negative.   Endo/Heme/Allergies: Negative.   Psychiatric/Behavioral: Negative.     PHYSICAL EXAMINATION:    BP (!) 110/54 (BP Location: Right Arm, Patient Position: Sitting, Cuff Size: Normal)   Pulse 66   Resp 14   Ht 5\' 4"  (1.626 m)   Wt 133 lb 1.6 oz (60.4 kg)   LMP 10/06/2012   BMI 22.85 kg/m     General appearance: alert, cooperative and appears stated age Abdomen: soft, non-tender; non distended, no masses,  no organomegaly  Pelvic: External genitalia:  no lesions              Urethra:  normal appearing urethra with no masses, tenderness or lesions              Bartholins and Skenes: normal                 Vagina: healing very well, slight amount of granulation tissue on the lower anterior vaginal wall, treated with silver nitrate.  Vaginal cuff: healing well, very well supported, mildly tender              Cervix: absent              Bimanual Exam:  Uterus:  uterus absent              Adnexa: no mass, fullness, tenderness              Bladder tender  Chaperone was present for exam.  ASSESSMENT 2 months s/p LAVH/LOA/Anterior repair/uterosacral ligament suspension and TVT, overall feeling so much better, no leakage, no pain 2 week h/o urinary frequency and having to bend over to void.    PLAN Send urine for ua, c&s If negative for infection and symptoms persist, will check PVR Urine dip: negative   An After Visit Summary was printed and given to the patient.

## 2018-07-17 ENCOUNTER — Ambulatory Visit (INDEPENDENT_AMBULATORY_CARE_PROVIDER_SITE_OTHER): Payer: BLUE CROSS/BLUE SHIELD | Admitting: Obstetrics and Gynecology

## 2018-07-17 ENCOUNTER — Other Ambulatory Visit: Payer: Self-pay

## 2018-07-17 ENCOUNTER — Encounter: Payer: Self-pay | Admitting: Obstetrics and Gynecology

## 2018-07-17 VITALS — BP 110/54 | HR 66 | Resp 14 | Ht 64.0 in | Wt 133.1 lb

## 2018-07-17 DIAGNOSIS — R350 Frequency of micturition: Secondary | ICD-10-CM

## 2018-07-17 DIAGNOSIS — R3911 Hesitancy of micturition: Secondary | ICD-10-CM | POA: Diagnosis not present

## 2018-07-17 DIAGNOSIS — Z90710 Acquired absence of both cervix and uterus: Secondary | ICD-10-CM | POA: Diagnosis not present

## 2018-07-17 LAB — POCT URINALYSIS DIPSTICK
Bilirubin, UA: NEGATIVE
Blood, UA: NEGATIVE
Glucose, UA: NEGATIVE
Ketones, UA: NEGATIVE
LEUKOCYTES UA: NEGATIVE
NITRITE UA: NEGATIVE
PROTEIN UA: NEGATIVE
Urobilinogen, UA: 0.2 E.U./dL
pH, UA: 5 (ref 5.0–8.0)

## 2018-07-18 LAB — URINALYSIS, MICROSCOPIC ONLY
Bacteria, UA: NONE SEEN
CASTS: NONE SEEN /LPF

## 2018-07-18 LAB — URINE CULTURE

## 2018-07-20 ENCOUNTER — Telehealth: Payer: Self-pay

## 2018-07-20 NOTE — Telephone Encounter (Signed)
-----   Message from Romualdo BolkJill Evelyn Jertson, MD sent at 07/20/2018  7:08 AM EST ----- Please inform the patient's daughter that her urine culture was negative for infection. She needs to come in for a post void residual, I'm concerned she is retaining urine.

## 2018-07-20 NOTE — Telephone Encounter (Signed)
Spoke with patient's daughter [name], okay per ROI. Advised of message as seen below from Dr.Jertson. Radhika verbalizes understanding. Appointment scheduled for patient on 07/22/2018 at 11:30 am. Aware the patient needs to arrive to the office with a full bladder so that she can void right before Dr.Jertson uses the cath to test her PVR. Encounter closed.

## 2018-07-21 NOTE — Progress Notes (Signed)
GYNECOLOGY  VISIT   HPI: 52 y.o.   Married Asian Not Hispanic or Latino  female   [phone] with Patient's last menstrual period was 10/06/2012.   here for evaluation of urinary symptoms. She is just over 2 months s/p LAVH/LOA/Anterior repair/US ligament suspension and TVT. She presented last week c/o a 2 week h/o needing to void more frequently and to bend over to pee. She feels she is emptying her bladder. Prior PVR a few weeks post op was 30 cc. Urine culture from last week was negative for infection.    GYNECOLOGIC HISTORY: Patient's last menstrual period was 10/06/2012. Contraception: Hysterectomy Menopausal hormone therapy: None        OB History    Gravida  3   Para  3   Term  3   Preterm      AB      Living  3     SAB      TAB      Ectopic      Multiple      Live Births  3              Patient Active Problem List   Diagnosis Date Noted  . Dysuria 06/10/2018  . Headache syndrome 06/10/2018  . Illiteracy 06/10/2018  . Recent major surgery 05/12/2018  . Subclinical hyperthyroidism 05/07/2018  . Muscle spasm 03/06/2018  . Chronic neck pain 03/06/2018  . Chronic upper back pain 03/06/2018  . Palpitation 03/06/2018  . Abnormal TSH 01/08/2018  . Female bladder prolapse 06/18/2017  . Dizziness 01/13/2017  . Hyperlipidemia 01/13/2017  . Weight gain 12/13/2016  . Edema 12/13/2016  . Pelvic pain in female 10/27/2015  . Chronic right shoulder pain 10/27/2015  . Encounter for health maintenance examination in adult 10/27/2015  . Special screening for malignant neoplasms, colon 10/27/2015  . Screening for breast cancer 10/27/2015    Past Medical History:  Diagnosis Date  . Chronic neck pain   . Hyperlipidemia   . Hypertension   . Shoulder pain   . Subclinical hyperthyroidism 05/07/2018    Past Surgical History:  Procedure Laterality Date  . BILATERAL SALPINGECTOMY Bilateral 05/12/2018   Procedure: BILATERAL SALPINGECTOMY;  Surgeon: Romualdo Bolk, MD;  Location: [facility];  Service: Gynecology;  Laterality: Bilateral;  . BLADDER SUSPENSION N/A 05/12/2018   Procedure: TRANSVAGINAL TAPE (TVT) PROCEDURE;  Surgeon: Romualdo Bolk, MD;  Location: [facility];  Service: Gynecology;  Laterality: N/A;  . BREAST LUMPECTOMY Right 2010  . CYST EXCISION     upper chest/ breast ([facility]), 1 week hospitalization  . CYSTOCELE REPAIR N/A 05/12/2018   Procedure: ANTERIOR REPAIR (CYSTOCELE) with uterosacral ligament suspension;  Surgeon: Romualdo Bolk, MD;  Location: [facility];  Service: Gynecology;  Laterality: N/A;  . CYSTOSCOPY N/A 05/12/2018   Procedure: CYSTOSCOPY;  Surgeon: Romualdo Bolk, MD;  Location: [facility];  Service: Gynecology;  Laterality: N/A;  . JEJUNOSTOMY FEEDING TUBE     prior with surgery  . LAPAROSCOPIC VAGINAL HYSTERECTOMY WITH SALPINGECTOMY Bilateral 05/12/2018   Procedure: LAPAROSCOPIC ASSISTED VAGINAL HYSTERECTOMY WITH BILATERAL  SALPINGOOPHRECTOMY AND EXTENSIVE LYSIS OF ADHESIONS;  Surgeon: Romualdo Bolk, MD;  Location: [facility] [place];  Service: Gynecology;  Laterality: Bilateral;  . TONSILLECTOMY     Right tonsillectomy, 2wk hospitalization ([facility])  . VAGINAL HYSTERECTOMY N/A 05/12/2018   Procedure: ATTEMPTED HYSTERECTOMY VAGINAL;  Surgeon: Romualdo Bolk, MD;  Location: [facility];  Service: Gynecology;  Laterality: N/A;  TVH/BS/anterior repair/ uterosacral ligament suspension/TVT/ cysto/ 3 hours/ extended recovery bed    Current Outpatient Medications  Medication Sig Dispense Refill  . docusate sodium (COLACE) 100 MG capsule Take 1 capsule (100 mg total) by mouth 2 (two) times daily. 10 capsule 0  . ibuprofen (ADVIL,MOTRIN) 800 MG tablet Take 1 tablet (800 mg total) by mouth every 8 (eight) hours as needed. 30 tablet 1   No current facility-administered medications for this visit.       ALLERGIES: Hydrocodone  Family History  Problem Relation Age of Onset  . Hypertension Father   . Cancer Neg Hx   . Diabetes Neg Hx   . Heart disease Neg Hx   . Stroke Neg Hx   . Thyroid disease Neg Hx     Social History   Socioeconomic History  . Marital status: Married    Spouse name: Not on file  . Number of children: Not on file  . Years of education: Not on file  . Highest education level: Not on file  Occupational History  . Not on file  Social Needs  . Financial resource strain: Not on file  . Food insecurity:    Worry: Not on file    Inability: Not on file  . Transportation needs:    Medical: Not on file    Non-medical: Not on file  Tobacco Use  . Smoking status: Never Smoker  . Smokeless tobacco: Never Used  Substance and Sexual Activity  . Alcohol use: No    Alcohol/week: 0.0 standard drinks  . Drug use: No  . Sexual activity: Not Currently    Partners: Male    Birth control/protection: None, Post-menopausal  Lifestyle  . Physical activity:    Days per week: Not on file    Minutes per session: Not on file  . Stress: Not on file  Relationships  . Social connections:    Talks on phone: Not on file    Gets together: Not on file    Attends religious service: Not on file    Active member of club or organization: Not on file    Attends meetings of clubs or organizations: Not on file    Relationship status: Not on file  . Intimate partner violence:    Fear of current or ex partner: Not on file    Emotionally abused: Not on file    Physically abused: Not on file    Forced sexual activity: Not on file  Other Topics Concern  . Not on file  Social History Narrative   Lives with husband, 3 children, ages 219yo, 52yo, 52yo, works in Designer, fashion/clothingtextiles, various jobs, some exercise with walking    Review of Systems  Constitutional: Negative.   HENT: Negative.   Eyes: Negative.   Respiratory: Negative.   Cardiovascular: Negative.   Gastrointestinal: Negative.    Genitourinary:       Does not feel she is emptying bladder  Musculoskeletal: Negative.   Skin: Negative.   Neurological: Negative.   Endo/Heme/Allergies: Negative.   Psychiatric/Behavioral: Negative.     PHYSICAL EXAMINATION:    BP 120/78 (BP Location: Right Arm, Patient Position: Sitting, Cuff Size: Normal)   Pulse 64   Wt 134 lb (60.8 kg)   LMP 10/06/2012   BMI 23.00 kg/m     General appearance: alert, cooperative and appears stated age   Pelvic: External genitalia:  no lesions  Urethra:  normal appearing urethra with no masses, tenderness or lesions              Bartholins and Skenes: normal                 Vagina: not examined (normal exam last week)  The patient voided 80 cc when she got here PVR was 130 cc, the catheter passed easily  Chaperone was present for exam.  ASSESSMENT 2 + months s/p prolapse repair and TVT, slight voiding dysfunction in the last 2 weeks. Normal exam, negative urine culture. PVR is borderline    PLAN Will have her double void Return next week for repeat PVR, want to make sure her retention isn't worsening Given information on self cath and some catheters incase she doesn't feel she can empty   An After Visit Summary was printed and given to the patient.  ~15 minutes face to face time of which over 50% was spent in counseling.

## 2018-07-22 ENCOUNTER — Ambulatory Visit: Payer: BLUE CROSS/BLUE SHIELD | Admitting: Obstetrics and Gynecology

## 2018-07-22 ENCOUNTER — Other Ambulatory Visit: Payer: Self-pay

## 2018-07-22 ENCOUNTER — Encounter: Payer: Self-pay | Admitting: Obstetrics and Gynecology

## 2018-07-22 ENCOUNTER — Telehealth: Payer: Self-pay | Admitting: Obstetrics and Gynecology

## 2018-07-22 VITALS — BP 120/78 | HR 64 | Wt 134.0 lb

## 2018-07-22 DIAGNOSIS — N398 Other specified disorders of urinary system: Secondary | ICD-10-CM

## 2018-07-22 DIAGNOSIS — Z0001 Encounter for general adult medical examination with abnormal findings: Secondary | ICD-10-CM

## 2018-07-22 NOTE — Telephone Encounter (Signed)
No answer, voicemail not set up.   Reviewed with Dr. Oscar LaJertson, schedule OV for 07/28/18.

## 2018-07-22 NOTE — Telephone Encounter (Signed)
Spoke with patients daughter, [name], ok per dpr. OV schuedled for 07/28/18 at 11am with Dr. Oscar LaJertson. Daughter agreeable to time and date.   Encounter closed.

## 2018-07-22 NOTE — Telephone Encounter (Signed)
Dr. Oscar LaJertson would like to see patient back for a follow up next Thursday 12/19. No open spots for me to schedule.

## 2018-07-28 ENCOUNTER — Ambulatory Visit: Payer: BLUE CROSS/BLUE SHIELD | Admitting: Obstetrics and Gynecology

## 2018-07-28 ENCOUNTER — Encounter: Payer: Self-pay | Admitting: Obstetrics and Gynecology

## 2018-07-28 VITALS — BP 112/72 | HR 84 | Resp 16 | Wt 133.0 lb

## 2018-07-28 DIAGNOSIS — N952 Postmenopausal atrophic vaginitis: Secondary | ICD-10-CM

## 2018-07-28 DIAGNOSIS — R3911 Hesitancy of micturition: Secondary | ICD-10-CM

## 2018-07-28 MED ORDER — ESTROGENS, CONJUGATED 0.625 MG/GM VA CREA: TOPICAL_CREAM | VAGINAL | 1 refills | Status: DC

## 2018-07-28 NOTE — Patient Instructions (Signed)
Use the premarin cream 0.5 gram 2 x a week

## 2018-07-28 NOTE — Progress Notes (Signed)
GYNECOLOGY  VISIT   HPI: 52 y.o.   Married Asian Not Hispanic or Latino  female   [phone] with Patient's last menstrual period was 10/06/2012.   here for f/u for voiding dysfunction. She has some urinary hesitancy, has been double voiding, sometimes her lower abdomen hurts when she sits down to void if she doesn't bend over. If she bends over she feels okay.      GYNECOLOGIC HISTORY: Patient's last menstrual period was 10/06/2012. Contraception:Hysterectomy  Menopausal hormone therapy: None        OB History    Gravida  3   Para  3   Term  3   Preterm      AB      Living  3     SAB      TAB      Ectopic      Multiple      Live Births  3              Patient Active Problem List   Diagnosis Date Noted  . Dysuria 06/10/2018  . Headache syndrome 06/10/2018  . Illiteracy 06/10/2018  . Recent major surgery 05/12/2018  . Subclinical hyperthyroidism 05/07/2018  . Muscle spasm 03/06/2018  . Chronic neck pain 03/06/2018  . Chronic upper back pain 03/06/2018  . Palpitation 03/06/2018  . Abnormal TSH 01/08/2018  . Female bladder prolapse 06/18/2017  . Dizziness 01/13/2017  . Hyperlipidemia 01/13/2017  . Weight gain 12/13/2016  . Edema 12/13/2016  . Pelvic pain in female 10/27/2015  . Chronic right shoulder pain 10/27/2015  . Encounter for health maintenance examination in adult 10/27/2015  . Special screening for malignant neoplasms, colon 10/27/2015  . Screening for breast cancer 10/27/2015    Past Medical History:  Diagnosis Date  . Chronic neck pain   . Hyperlipidemia   . Hypertension   . Shoulder pain   . Subclinical hyperthyroidism 05/07/2018    Past Surgical History:  Procedure Laterality Date  . BILATERAL SALPINGECTOMY Bilateral 05/12/2018   Procedure: BILATERAL SALPINGECTOMY;  Surgeon: Romualdo Bolk, MD;  Location: [facility];  Service: Gynecology;  Laterality: Bilateral;  . BLADDER SUSPENSION N/A 05/12/2018   Procedure:  TRANSVAGINAL TAPE (TVT) PROCEDURE;  Surgeon: Romualdo Bolk, MD;  Location: [facility];  Service: Gynecology;  Laterality: N/A;  . BREAST LUMPECTOMY Right 2010  . CYST EXCISION     upper chest/ breast ([facility]), 1 week hospitalization  . CYSTOCELE REPAIR N/A 05/12/2018   Procedure: ANTERIOR REPAIR (CYSTOCELE) with uterosacral ligament suspension;  Surgeon: Romualdo Bolk, MD;  Location: [facility];  Service: Gynecology;  Laterality: N/A;  . CYSTOSCOPY N/A 05/12/2018   Procedure: CYSTOSCOPY;  Surgeon: Romualdo Bolk, MD;  Location: [facility];  Service: Gynecology;  Laterality: N/A;  . JEJUNOSTOMY FEEDING TUBE     prior with surgery  . LAPAROSCOPIC VAGINAL HYSTERECTOMY WITH SALPINGECTOMY Bilateral 05/12/2018   Procedure: LAPAROSCOPIC ASSISTED VAGINAL HYSTERECTOMY WITH BILATERAL  SALPINGOOPHRECTOMY AND EXTENSIVE LYSIS OF ADHESIONS;  Surgeon: Romualdo Bolk, MD;  Location: [facility];  Service: Gynecology;  Laterality: Bilateral;  . TONSILLECTOMY     Right tonsillectomy, 2wk hospitalization ([facility])  . VAGINAL HYSTERECTOMY N/A 05/12/2018   Procedure: ATTEMPTED HYSTERECTOMY VAGINAL;  Surgeon: Romualdo Bolk, MD;  Location: [facility];  Service: Gynecology;  Laterality: N/A;  TVH/BS/anterior repair/ uterosacral ligament suspension/TVT/ cysto/ 3 hours/ extended recovery bed    Current Outpatient Medications  Medication  Sig Dispense Refill  . docusate sodium (COLACE) 100 MG capsule Take 1 capsule (100 mg total) by mouth 2 (two) times daily. 10 capsule 0  . ibuprofen (ADVIL,MOTRIN) 800 MG tablet Take 1 tablet (800 mg total) by mouth every 8 (eight) hours as needed. 30 tablet 1   No current facility-administered medications for this visit.      ALLERGIES: Hydrocodone  Family History  Problem Relation Age of Onset  . Hypertension Father   . Cancer Neg Hx   . Diabetes Neg Hx   . Heart disease  Neg Hx   . Stroke Neg Hx   . Thyroid disease Neg Hx     Social History   Socioeconomic History  . Marital status: Married    Spouse name: Not on file  . Number of children: Not on file  . Years of education: Not on file  . Highest education level: Not on file  Occupational History  . Not on file  Social Needs  . Financial resource strain: Not on file  . Food insecurity:    Worry: Not on file    Inability: Not on file  . Transportation needs:    Medical: Not on file    Non-medical: Not on file  Tobacco Use  . Smoking status: Never Smoker  . Smokeless tobacco: Never Used  Substance and Sexual Activity  . Alcohol use: No    Alcohol/week: 0.0 standard drinks  . Drug use: No  . Sexual activity: Not Currently    Partners: Male    Birth control/protection: None, Post-menopausal  Lifestyle  . Physical activity:    Days per week: Not on file    Minutes per session: Not on file  . Stress: Not on file  Relationships  . Social connections:    Talks on phone: Not on file    Gets together: Not on file    Attends religious service: Not on file    Active member of club or organization: Not on file    Attends meetings of clubs or organizations: Not on file    Relationship status: Not on file  . Intimate partner violence:    Fear of current or ex partner: Not on file    Emotionally abused: Not on file    Physically abused: Not on file    Forced sexual activity: Not on file  Other Topics Concern  . Not on file  Social History Narrative   Lives with husband, 3 children, ages 52yo, 52yo, 52yo, works in Designer, fashion/clothingtextiles, various jobs, some exercise with walking    Review of Systems  Constitutional: Negative.   HENT: Negative.   Eyes: Negative.   Respiratory: Negative.   Cardiovascular: Negative.   Gastrointestinal: Negative.   Genitourinary: Negative.   Musculoskeletal: Negative.   Skin: Negative.   Neurological: Negative.   Endo/Heme/Allergies: Negative.    Psychiatric/Behavioral: Negative.     PHYSICAL EXAMINATION:    LMP 10/06/2012     General appearance: alert, cooperative and appears stated age  Pelvic: External genitalia:  no lesions              Urethra:  normal appearing urethra with no masses, tenderness or lesions              Bartholins and Skenes: normal                 Vagina: atrophic appearing vagina with normal color and discharge, no lesions, healing well from her surgery, excellent support  Cervix: absent              Bimanual Exam:  Uterus:  uterus absent              Adnexa: no masses, mildly tender on BM exam diffusely               St cath performed using sterile technique PVR: 50 cc  Chaperone was present for exam.  ASSESSMENT 2.5 months s/p LAVH/LOA/Anterior repair?uterosacral ligament suspension and TVT. 3 week h/o needing to bend forward to void, last week with mild urinary retention. She has been double voiding, feels she is emptying her bladder. PVR is normal today Vaginal atrophy    PLAN Start premarin vaginal cream Discussed that she may need to continue to bend forward to void Call if she is having trouble voiding F/U in one month, sooner with any concerns   An After Visit Summary was printed and given to the patient.  ~15 minutes face to face time of which over 50% was spent in counseling.

## 2018-07-31 ENCOUNTER — Telehealth: Payer: Self-pay | Admitting: Obstetrics and Gynecology

## 2018-07-31 ENCOUNTER — Ambulatory Visit: Payer: BLUE CROSS/BLUE SHIELD | Admitting: Obstetrics and Gynecology

## 2018-07-31 ENCOUNTER — Encounter: Payer: Self-pay | Admitting: Obstetrics and Gynecology

## 2018-07-31 ENCOUNTER — Other Ambulatory Visit: Payer: Self-pay

## 2018-07-31 VITALS — BP 138/70 | HR 84 | Ht 63.75 in | Wt 134.2 lb

## 2018-07-31 DIAGNOSIS — R300 Dysuria: Secondary | ICD-10-CM

## 2018-07-31 LAB — POCT URINALYSIS DIPSTICK
BILIRUBIN UA: NEGATIVE
GLUCOSE UA: NEGATIVE
KETONES UA: NEGATIVE
Nitrite, UA: NEGATIVE
PH UA: 5 (ref 5.0–8.0)
Protein, UA: NEGATIVE
UROBILINOGEN UA: 0.2 U/dL

## 2018-07-31 MED ORDER — SULFAMETHOXAZOLE-TRIMETHOPRIM 800-160 MG PO TABS: 1.0000 | ORAL_TABLET | Freq: Two times a day (BID) | ORAL | 0 refills | Status: DC

## 2018-07-31 MED ORDER — PHENAZOPYRIDINE HCL 200 MG PO TABS: 200.0000 mg | ORAL_TABLET | Freq: Three times a day (TID) | ORAL | 0 refills | Status: DC | PRN

## 2018-07-31 NOTE — Patient Instructions (Signed)
Urinary Tract Infection, Adult A urinary tract infection (UTI) is an infection of any part of the urinary tract. The urinary tract includes:  The kidneys.  The ureters.  The bladder.  The urethra. These organs make, store, and get rid of pee (urine) in the body. What are the causes? This is caused by germs (bacteria) in your genital area. These germs grow and cause swelling (inflammation) of your urinary tract. What increases the risk? You are more likely to develop this condition if:  You have a small, thin tube (catheter) to drain pee.  You cannot control when you pee or poop (incontinence).  You are female, and: ? You use these methods to prevent pregnancy: ? A medicine that kills sperm (spermicide). ? A device that blocks sperm (diaphragm). ? You have low levels of a female hormone (estrogen). ? You are pregnant.  You have genes that add to your risk.  You are sexually active.  You take antibiotic medicines.  You have trouble peeing because of: ? A prostate that is bigger than normal, if you are female. ? A blockage in the part of your body that drains pee from the bladder (urethra). ? A kidney stone. ? A nerve condition that affects your bladder (neurogenic bladder). ? Not getting enough to drink. ? Not peeing often enough.  You have other conditions, such as: ? Diabetes. ? A weak disease-fighting system (immune system). ? Sickle cell disease. ? Gout. ? Injury of the spine. What are the signs or symptoms? Symptoms of this condition include:  Needing to pee right away (urgently).  Peeing often.  Peeing small amounts often.  Pain or burning when peeing.  Blood in the pee.  Pee that smells bad or not like normal.  Trouble peeing.  Pee that is cloudy.  Fluid coming from the vagina, if you are female.  Pain in the belly or lower back. Other symptoms include:  Throwing up (vomiting).  No urge to eat.  Feeling mixed up (confused).  Being tired  and grouchy (irritable).  A fever.  Watery poop (diarrhea). How is this treated? This condition may be treated with:  Antibiotic medicine.  Other medicines.  Drinking enough water. Follow these instructions at home:  Medicines  Take over-the-counter and prescription medicines only as told by your doctor.  If you were prescribed an antibiotic medicine, take it as told by your doctor. Do not stop taking it even if you start to feel better. General instructions  Make sure you: ? Pee until your bladder is empty. ? Do not hold pee for a long time. ? Empty your bladder after sex. ? Wipe from front to back after pooping if you are a female. Use each tissue one time when you wipe.  Drink enough fluid to keep your pee pale yellow.  Keep all follow-up visits as told by your doctor. This is important. Contact a doctor if:  You do not get better after 1-2 days.  Your symptoms go away and then come back. Get help right away if:  You have very bad back pain.  You have very bad pain in your lower belly.  You have a fever.  You are sick to your stomach (nauseous).  You are throwing up. Summary  A urinary tract infection (UTI) is an infection of any part of the urinary tract.  This condition is caused by germs in your genital area.  There are many risk factors for a UTI. These include having a small, thin   tube to drain pee and not being able to control when you pee or poop.  Treatment includes antibiotic medicines for germs.  Drink enough fluid to keep your pee pale yellow. This information is not intended to replace advice given to you by your health care provider. Make sure you discuss any questions you have with your health care provider. Document Released: 01/15/2008 Document Revised: 02/05/2018 Document Reviewed: 02/05/2018 Elsevier Interactive Patient Education  2019 Elsevier Inc.  

## 2018-07-31 NOTE — Progress Notes (Signed)
GYNECOLOGY  VISIT   HPI: 52 y.o.   Married  Asian  female   [phone] with Patient's last menstrual period was 10/06/2012.   here for dysuria.  Interpretor present for the visit today.   Pain started 2 days ago.  Back pain.  Cramping pain with voiding.  No fever.  No nausea or vomiting.  No blood in her urine.  Urine Dip:1+WBCs, Trace RBCs   GYNECOLOGIC HISTORY: Patient's last menstrual period was 10/06/2012. Contraception:  Hysterectomy Menopausal hormone therapy:  Premarin vaginal cream Last mammogram: 05-11-18 3D Neg/density C/BiRads1 Last pap smear: 05/09/17 Neg. HR HPV:neg         OB History    Gravida  3   Para  3   Term  3   Preterm      AB      Living  3     SAB      TAB      Ectopic      Multiple      Live Births  3              Patient Active Problem List   Diagnosis Date Noted  . Dysuria 06/10/2018  . Headache syndrome 06/10/2018  . Illiteracy 06/10/2018  . Recent major surgery 05/12/2018  . Subclinical hyperthyroidism 05/07/2018  . Muscle spasm 03/06/2018  . Chronic neck pain 03/06/2018  . Chronic upper back pain 03/06/2018  . Palpitation 03/06/2018  . Abnormal TSH 01/08/2018  . Female bladder prolapse 06/18/2017  . Dizziness 01/13/2017  . Hyperlipidemia 01/13/2017  . Weight gain 12/13/2016  . Edema 12/13/2016  . Pelvic pain in female 10/27/2015  . Chronic right shoulder pain 10/27/2015  . Encounter for health maintenance examination in adult 10/27/2015  . Special screening for malignant neoplasms, colon 10/27/2015  . Screening for breast cancer 10/27/2015    Past Medical History:  Diagnosis Date  . Chronic neck pain   . Hyperlipidemia   . Hypertension   . Shoulder pain   . Subclinical hyperthyroidism 05/07/2018    Past Surgical History:  Procedure Laterality Date  . BILATERAL SALPINGECTOMY Bilateral 05/12/2018   Procedure: BILATERAL SALPINGECTOMY;  Surgeon: Romualdo Bolk, MD;  Location: [facility];   Service: Gynecology;  Laterality: Bilateral;  . BLADDER SUSPENSION N/A 05/12/2018   Procedure: TRANSVAGINAL TAPE (TVT) PROCEDURE;  Surgeon: Romualdo Bolk, MD;  Location: [place];  Service: Gynecology;  Laterality: N/A;  . BREAST LUMPECTOMY Right 2010  . CYST EXCISION     upper chest/ breast ([facility]), 1 week hospitalization  . CYSTOCELE REPAIR N/A 05/12/2018   Procedure: ANTERIOR REPAIR (CYSTOCELE) with uterosacral ligament suspension;  Surgeon: Romualdo Bolk, MD;  Location[facility];  Service: Gynecology;  Laterality: N/A;  . CYSTOSCOPY N/A 05/12/2018   Procedure: CYSTOSCOPY;  Surgeon: Romualdo Bolk, MD;  Location: [facility];  Service: Gynecology;  Laterality: N/A;  . JEJUNOSTOMY FEEDING TUBE     prior with surgery  . LAPAROSCOPIC VAGINAL HYSTERECTOMY WITH SALPINGECTOMY Bilateral 05/12/2018   Procedure: LAPAROSCOPIC ASSISTED VAGINAL HYSTERECTOMY WITH BILATERAL  SALPINGOOPHRECTOMY AND EXTENSIVE LYSIS OF ADHESIONS;  Surgeon: Romualdo Bolk, MD;  Location: [facility];  Service: Gynecology;  Laterality: Bilateral;  . TONSILLECTOMY     Right tonsillectomy, 2wk hospitalization ([facility])  . VAGINAL HYSTERECTOMY N/A 05/12/2018   Procedure: ATTEMPTED HYSTERECTOMY VAGINAL;  Surgeon: Romualdo Bolk, MD;  Location: [facility];  Service: Gynecology;  Laterality: N/A;  TVH/BS/anterior  repair/ uterosacral ligament suspension/TVT/ cysto/ 3 hours/ extended recovery bed    Current Outpatient Medications  Medication Sig Dispense Refill  . conjugated estrogens (PREMARIN) vaginal cream 1/2 gram vaginally twice weekly 30 g 1  . ibuprofen (ADVIL,MOTRIN) 800 MG tablet Take 1 tablet (800 mg total) by mouth every 8 (eight) hours as needed. 30 tablet 1   No current facility-administered medications for this visit.      ALLERGIES: Hydrocodone  Family History  Problem Relation Age of Onset  .  Hypertension Father   . Cancer Neg Hx   . Diabetes Neg Hx   . Heart disease Neg Hx   . Stroke Neg Hx   . Thyroid disease Neg Hx     Social History   Socioeconomic History  . Marital status: Married    Spouse name: Not on file  . Number of children: Not on file  . Years of education: Not on file  . Highest education level: Not on file  Occupational History  . Not on file  Social Needs  . Financial resource strain: Not on file  . Food insecurity:    Worry: Not on file    Inability: Not on file  . Transportation needs:    Medical: Not on file    Non-medical: Not on file  Tobacco Use  . Smoking status: Never Smoker  . Smokeless tobacco: Never Used  Substance and Sexual Activity  . Alcohol use: No    Alcohol/week: 0.0 standard drinks  . Drug use: No  . Sexual activity: Not Currently    Partners: Male    Birth control/protection: None, Post-menopausal  Lifestyle  . Physical activity:    Days per week: Not on file    Minutes per session: Not on file  . Stress: Not on file  Relationships  . Social connections:    Talks on phone: Not on file    Gets together: Not on file    Attends religious service: Not on file    Active member of club or organization: Not on file    Attends meetings of clubs or organizations: Not on file    Relationship status: Not on file  . Intimate partner violence:    Fear of current or ex partner: Not on file    Emotionally abused: Not on file    Physically abused: Not on file    Forced sexual activity: Not on file  Other Topics Concern  . Not on file  Social History Narrative   Lives with husband, 3 children, ages 52yo, 52yo, 52yo, works in Designer, fashion/clothingtextiles, various jobs, some exercise with walking    Review of Systems  Genitourinary: Positive for dysuria and hematuria.  All other systems reviewed and are negative.   PHYSICAL EXAMINATION:    BP 138/70 (BP Location: Right Arm, Patient Position: Sitting, Cuff Size: Normal)   Pulse 84   Ht 5'  3.75" (1.619 m)   Wt 134 lb 3.2 oz (60.9 kg)   LMP 10/06/2012   BMI 23.22 kg/m     General appearance: alert, cooperative and appears stated age   Pelvic: External genitalia:  no lesions              Urethra:  normal appearing urethra with no masses, tenderness or lesions              Bartholins and Skenes: normal                 Vagina: normal appearing vagina with  normal color and discharge, no lesions.  Good vaginal support anteriorly and apex.              Cervix:  absent                Bimanual Exam:  Uterus:   Absent.               Adnexa: no mass, fullness, tenderness              Chaperone was present for exam.  ASSESSMENT  Status post LAVH/BSO/LOA/anterior colporrhaphy/uterosacral ligament fixation/TVT Exact midurethral sling/cystoscopy.  UTI.  PLAN  Urine micro and cx.  Hydrate. Bactrim DS po bid x 3 days.  Pyridium 200 mg po tid x 2 -3 days.  Return if symptoms not improved in 48 hours.    An After Visit Summary was printed and given to the patient.  __15____ minutes face to face time of which over 50% was spent in counseling.

## 2018-07-31 NOTE — Telephone Encounter (Signed)
Patient's spouse, Nathanial MillmanChhatra Galvan (okay to share PHI per DPR on file), called requesting an appointment for today or Monday, 08/03/18, for his wife for internal vaginal irritation.

## 2018-07-31 NOTE — Telephone Encounter (Signed)
Spoke with patient's husband, Mr. Becky Goodwin. He states his wife, Becky Goodwin, is complaining of vaginal pain and burning at the beginning and end of her urine stream.  Patient has been double voiding.  Last office visit with Dr. Oscar LaJertson 07/28/18. Started on Premarin cream.  Patient not available for phone triage per husband.  Office visit today with Dr. Edward JollySilva scheduled.  Encounter to Dr. Edward JollySilva and Dr. Oscar LaJertson. Will close encounter.

## 2018-08-03 LAB — URINALYSIS, MICROSCOPIC ONLY
Bacteria, UA: NONE SEEN
Casts: NONE SEEN /lpf

## 2018-08-03 LAB — URINE CULTURE

## 2018-08-14 ENCOUNTER — Ambulatory Visit: Payer: BLUE CROSS/BLUE SHIELD | Admitting: Medical

## 2018-08-14 ENCOUNTER — Encounter: Payer: Self-pay | Admitting: Medical

## 2018-08-14 VITALS — BP 120/70 | HR 69 | Temp 97.6°F | Resp 16 | Ht 64.0 in | Wt 134.6 lb

## 2018-08-14 DIAGNOSIS — J029 Acute pharyngitis, unspecified: Secondary | ICD-10-CM

## 2018-08-14 DIAGNOSIS — B001 Herpesviral vesicular dermatitis: Secondary | ICD-10-CM

## 2018-08-14 DIAGNOSIS — D649 Anemia, unspecified: Secondary | ICD-10-CM

## 2018-08-14 DIAGNOSIS — E0590 Thyrotoxicosis, unspecified without thyrotoxic crisis or storm: Secondary | ICD-10-CM

## 2018-08-14 DIAGNOSIS — T148XXA Other injury of unspecified body region, initial encounter: Secondary | ICD-10-CM

## 2018-08-14 DIAGNOSIS — E785 Hyperlipidemia, unspecified: Secondary | ICD-10-CM

## 2018-08-14 LAB — CBC
HEMOGLOBIN: 12.4 g/dL (ref 11.1–15.9)
Hematocrit: 36.8 % (ref 34.0–46.6)
MCH: 29 pg (ref 26.6–33.0)
MCHC: 33.7 g/dL (ref 31.5–35.7)
MCV: 86 fL (ref 79–97)
PLATELETS: 279 10*3/uL (ref 150–450)
RBC: 4.28 x10E6/uL (ref 3.77–5.28)
RDW: 12.6 % (ref 12.3–15.4)
WBC: 5.7 10*3/uL (ref 3.4–10.8)

## 2018-08-14 MED ORDER — VALACYCLOVIR HCL 1 G PO TABS: ORAL_TABLET | ORAL | 3 refills | Status: DC

## 2018-08-14 NOTE — Progress Notes (Addendum)
Subjective:  Becky Goodwin is a 53 y.o. female who presents for Chief Complaint  Patient presents with  . sore throat    sore throat, tongue pain, bruise on leg      Here with husband for concerns.  Used interpreter on a stick.  She has concerns about a sore on her lips on the left.  She gets sores in her mouth quite regularly, that is the first time she has had a crusted blistered sore on her lip.  This is been there about 5 days.  She has never been on medication for cold sores, no specific diagnosis of this in the past.  She has questions about whether her cholesterol needs to be checked, and she is compliant with her medication  She saw endocrinology a few months ago and was put on thyroid medicine, compliant but has not followed up yet with them  She has been under some stress, had hysterectomy surgery in recent months  No other aggravating or relieving factors.    No other c/o.  The following portions of the patient's history were reviewed and updated as appropriate: allergies, current medications, past family history, past medical history, past social history, past surgical history and problem list.  ROS Otherwise as in subjective above   Objective: BP 120/70   Pulse 69   Temp 97.6 F (36.4 C) (Oral)   Resp 16   Ht 5\' 4"  (1.626 m)   Wt 134 lb 9.6 oz (61.1 kg)   LMP 10/06/2012   SpO2 97%   BMI 23.10 kg/m   General appearance: alert, no distress, well developed, well nourished She has about a 4 cm diameter purplish bruise just proximal to the left knee anteriorly, Left upper and lower lip laterally with crusted lesions suggestive of healing cold sore, she has some other small ulcers on the bottom of the right tongue and in the gum on the right Otherwise HEENT unremarkable Neck: supple, no lymphadenopathy, no thyromegaly, no masses    Assessment: Encounter Diagnoses  Name Primary?  Marland Kitchen Anemia, unspecified type Yes  . Bruising   . Cold sore   . Sore throat   .  Subclinical hyperthyroidism   . Hyperlipidemia, unspecified hyperlipidemia type      Plan: Anemia and bruising- she had anemia during her hysterectomy surgery in recent months, will recheck labs today since she is concerned about bruising.  We discussed other reasons she could have bruising.  Hypothyroidism-continue medication started by endocrinology and follow-up with them as planned  We discussed her findings today of cold sore and mouth ulcers-we discussed diagnosis, treatment both acute in suppressive therapy, discussed means of contagion, hygiene, and prevention of spread of the virus.  Begin Valtrex daily for suppression, discussed proper use of medication  She had question about cholesterol-advised we only need to check her labs yearly and continue current medication  [name] was seen today for sore throat.  Diagnoses and all orders for this visit:  Anemia, unspecified type -     CBC  Bruising -     CBC  Cold sore  Sore throat  Subclinical hyperthyroidism  Hyperlipidemia, unspecified hyperlipidemia type  Other orders -     valACYclovir (VALTREX) 1000 MG tablet; Daily for suppression, 2 tablets BID x 1 day for flare up    Follow up: pending labs

## 2018-08-14 NOTE — Patient Instructions (Addendum)
Cold sores: ????? ???? Valtrex ????? ???? ?????????, ? ???????????? BID x ? ??? ???????? ???? [place]? l?gi Valtrex dainika ?ur? garnuh?s, 2 ?y?bl??ahar? BID x 1 dina bha?ak?'unak? l?gi

## 2018-08-14 NOTE — Addendum Note (Signed)
Addended by: Jac Canavan on: 08/14/2018 02:08 PM   Modules accepted: Orders

## 2018-08-15 ENCOUNTER — Telehealth: Payer: Self-pay | Admitting: Endocrinology

## 2018-08-15 NOTE — Telephone Encounter (Signed)
please call patient: Ov needed next available.

## 2018-08-17 NOTE — Telephone Encounter (Signed)
Scheduled patient for f/u on 09/11/18

## 2018-08-31 NOTE — Progress Notes (Deleted)
GYNECOLOGY  VISIT   HPI: 53 y.o.   Married Asian Not Hispanic or Latino  female   [phone] with Patient's last menstrual period was 10/06/2012.   here for 1 month follow up on Premarin vaginal cream.    GYNECOLOGIC HISTORY: Patient's last menstrual period was 10/06/2012. Contraception:*** Menopausal hormone therapy: ***        OB History    Gravida  3   Para  3   Term  3   Preterm      AB      Living  3     SAB      TAB      Ectopic      Multiple      Live Births  3              Patient Active Problem List   Diagnosis Date Noted  . Anemia 08/14/2018  . Bruising 08/14/2018  . Cold sore 08/14/2018  . Sore throat 08/14/2018  . Dysuria 06/10/2018  . Headache syndrome 06/10/2018  . Illiteracy 06/10/2018  . Recent major surgery 05/12/2018  . Subclinical hyperthyroidism 05/07/2018  . Muscle spasm 03/06/2018  . Chronic neck pain 03/06/2018  . Chronic upper back pain 03/06/2018  . Palpitation 03/06/2018  . Abnormal TSH 01/08/2018  . Female bladder prolapse 06/18/2017  . Dizziness 01/13/2017  . Hyperlipidemia 01/13/2017  . Weight gain 12/13/2016  . Edema 12/13/2016  . Pelvic pain in female 10/27/2015  . Chronic right shoulder pain 10/27/2015  . Encounter for health maintenance examination in adult 10/27/2015  . Special screening for malignant neoplasms, colon 10/27/2015  . Screening for breast cancer 10/27/2015    Past Medical History:  Diagnosis Date  . Chronic neck pain   . Hyperlipidemia   . Hypertension   . Shoulder pain   . Subclinical hyperthyroidism 05/07/2018    Past Surgical History:  Procedure Laterality Date  . BILATERAL SALPINGECTOMY Bilateral 05/12/2018   Procedure: BILATERAL SALPINGECTOMY;  Surgeon: Romualdo Bolk, MD;  Location: [facility];  Service: Gynecology;  Laterality: Bilateral;  . BLADDER SUSPENSION N/A 05/12/2018   Procedure: TRANSVAGINAL TAPE (TVT) PROCEDURE;  Surgeon: Romualdo Bolk, MD;   Location: [facility];  Service: Gynecology;  Laterality: N/A;  . BREAST LUMPECTOMY Right 2010  . CYST EXCISION     upper chest/ breast ([facility]), 1 week hospitalization  . CYSTOCELE REPAIR N/A 05/12/2018   Procedure: ANTERIOR REPAIR (CYSTOCELE) with uterosacral ligament suspension;  Surgeon: Romualdo Bolk, MD;  Location: [facility];  Service: Gynecology;  Laterality: N/A;  . CYSTOSCOPY N/A 05/12/2018   Procedure: CYSTOSCOPY;  Surgeon: Romualdo Bolk, MD;  Location: [facility];  Service: Gynecology;  Laterality: N/A;  . JEJUNOSTOMY FEEDING TUBE     prior with surgery  . LAPAROSCOPIC VAGINAL HYSTERECTOMY WITH SALPINGECTOMY Bilateral 05/12/2018   Procedure: LAPAROSCOPIC ASSISTED VAGINAL HYSTERECTOMY WITH BILATERAL  SALPINGOOPHRECTOMY AND EXTENSIVE LYSIS OF ADHESIONS;  Surgeon: Romualdo Bolk, MD;  Location: [facility];  Service: Gynecology;  Laterality: Bilateral;  . TONSILLECTOMY     Right tonsillectomy, 2wk hospitalization ([facility])  . VAGINAL HYSTERECTOMY N/A 05/12/2018   Procedure: ATTEMPTED HYSTERECTOMY VAGINAL;  Surgeon: Romualdo Bolk, MD;  Location: [facility];  Service: Gynecology;  Laterality: N/A;  TVH/BS/anterior repair/ uterosacral ligament suspension/TVT/ cysto/ 3 hours/ extended recovery bed    Current Outpatient Medications  Medication Sig Dispense Refill  . conjugated estrogens (PREMARIN) vaginal cream 1/2 gram vaginally  twice weekly 30 g 1  . ibuprofen (ADVIL,MOTRIN) 800 MG tablet Take 1 tablet (800 mg total) by mouth every 8 (eight) hours as needed. (Patient not taking: Reported on 08/14/2018) 30 tablet 1  . valACYclovir (VALTREX) 1000 MG tablet Daily for suppression, 2 tablets BID x 1 day for flare up 45 tablet 3   No current facility-administered medications for this visit.      ALLERGIES: Hydrocodone  Family History  Problem Relation Age of Onset  . Hypertension Father   .  Cancer Neg Hx   . Diabetes Neg Hx   . Heart disease Neg Hx   . Stroke Neg Hx   . Thyroid disease Neg Hx     Social History   Socioeconomic History  . Marital status: Married    Spouse name: Not on file  . Number of children: Not on file  . Years of education: Not on file  . Highest education level: Not on file  Occupational History  . Not on file  Social Needs  . Financial resource strain: Not on file  . Food insecurity:    Worry: Not on file    Inability: Not on file  . Transportation needs:    Medical: Not on file    Non-medical: Not on file  Tobacco Use  . Smoking status: Never Smoker  . Smokeless tobacco: Never Used  Substance and Sexual Activity  . Alcohol use: No    Alcohol/week: 0.0 standard drinks  . Drug use: No  . Sexual activity: Not Currently    Partners: Male    Birth control/protection: None, Post-menopausal  Lifestyle  . Physical activity:    Days per week: Not on file    Minutes per session: Not on file  . Stress: Not on file  Relationships  . Social connections:    Talks on phone: Not on file    Gets together: Not on file    Attends religious service: Not on file    Active member of club or organization: Not on file    Attends meetings of clubs or organizations: Not on file    Relationship status: Not on file  . Intimate partner violence:    Fear of current or ex partner: Not on file    Emotionally abused: Not on file    Physically abused: Not on file    Forced sexual activity: Not on file  Other Topics Concern  . Not on file  Social History Narrative   Lives with husband, 3 children, ages 53yo, 53yo, 53yo, works in Designer, fashion/clothingtextiles, various jobs, some exercise with walking    ROS  PHYSICAL EXAMINATION:    LMP 10/06/2012     General appearance: alert, cooperative and appears stated age Neck: no adenopathy, supple, symmetrical, trachea midline and thyroid {CHL AMB PHY EX THYROID NORM DEFAULT:[phone]::"normal to inspection and  palpation"} Breasts: {Exam; breast:13139::"normal appearance, no masses or tenderness"} Abdomen: soft, non-tender; non distended, no masses,  no organomegaly  Pelvic: External genitalia:  no lesions              Urethra:  normal appearing urethra with no masses, tenderness or lesions              Bartholins and Skenes: normal                 Vagina: normal appearing vagina with normal color and discharge, no lesions              Cervix: {CHL AMB PHY  EX CERVIX NORM DEFAULT:[phone]::"no lesions"}              Bimanual Exam:  Uterus:  {CHL AMB PHY EX UTERUS NORM DEFAULT:[phone]::"normal size, contour, position, consistency, mobility, non-tender"}              Adnexa: {CHL AMB PHY EX ADNEXA NO MASS [phone]::"no mass, fullness, tenderness"}              Rectovaginal: {yes no:[id number]}.  Confirms.              Anus:  normal sphincter tone, no lesions  Chaperone was present for exam.  ASSESSMENT     PLAN    An After Visit Summary was printed and given to the patient.  *** minutes face to face time of which over 50% was spent in counseling.

## 2018-09-01 ENCOUNTER — Ambulatory Visit: Payer: BLUE CROSS/BLUE SHIELD | Admitting: Obstetrics and Gynecology

## 2018-09-01 ENCOUNTER — Telehealth: Payer: Self-pay | Admitting: Obstetrics and Gynecology

## 2018-09-01 NOTE — Telephone Encounter (Signed)
Attempted to reach patient's daughter Donnita Falls, okay per ROI. There was no answer and no availability to leave a voicemail.

## 2018-09-01 NOTE — Telephone Encounter (Signed)
Patient came in for a 1 month recheck with Dr. Oscar La today but decided to cancel the appointment due to new insurance that requires she is seen in another health system to use the benefits. Confirmed patient wanted to use her daughter as an interpreter today with interpreter: Budha, # [phone], form completed. Patient stated she is not having any problems at this time. Her daughter stated she will help her mom go to a new doctor, if needed, or switch insurance. The patient stated she wants to stay with Dr. Oscar La if she can.  Routing to provider for FYI.

## 2018-09-01 NOTE — Telephone Encounter (Signed)
Please call the patient's daughter and confirm that she is doing okay. Let her know that we will miss seeing her and if there is anything we can do to help her to call.

## 2018-09-08 NOTE — Telephone Encounter (Signed)
Spoke with patient's daughter Donnita [place], okay per ROI. Rodhika states that Shala is doing well and is not having any problems at this time. Will call if they need any assistance.   Routing to provider and will close encounter.

## 2018-09-10 ENCOUNTER — Telehealth: Payer: Self-pay | Admitting: Medical

## 2018-09-10 NOTE — Telephone Encounter (Signed)
The Magnesium Citrate can be used as needed, not daily ongoing.      She can try it the next few days.   If she continues to have problems, we can look into other medications she could take daily for prevention  Needs to drink plenty of water daily,get green leafy vegetables in the diet, and get fiber in the diet such as 25g fiber daily

## 2018-09-10 NOTE — Telephone Encounter (Signed)
Husband came by stating wife having problems with constipation and feels uneasy to pass stool.  She went to [facility] and pharmacist advised to use Dye Free Magnesium Citrate.  And she wants to know if this medicine is good to use longtime and what is your advice? Please call and let her know

## 2018-09-11 ENCOUNTER — Ambulatory Visit: Payer: BLUE CROSS/BLUE SHIELD | Admitting: Endocrinology

## 2018-09-11 NOTE — Telephone Encounter (Signed)
Called patient no voicemail set up no answer.

## 2018-09-14 NOTE — Telephone Encounter (Signed)
Patient's husband come by the office and was notified by Vernona Rieger and given message note.

## 2018-09-17 ENCOUNTER — Ambulatory Visit: Payer: BLUE CROSS/BLUE SHIELD | Admitting: Endocrinology

## 2018-09-22 ENCOUNTER — Encounter: Payer: Self-pay | Admitting: Obstetrics and Gynecology

## 2018-09-22 ENCOUNTER — Ambulatory Visit: Payer: BLUE CROSS/BLUE SHIELD | Admitting: Obstetrics and Gynecology

## 2018-09-22 VITALS — BP 110/64 | HR 66 | Resp 14 | Ht 64.0 in | Wt 135.0 lb

## 2018-09-22 DIAGNOSIS — R1030 Lower abdominal pain, unspecified: Secondary | ICD-10-CM

## 2018-09-22 DIAGNOSIS — K5900 Constipation, unspecified: Secondary | ICD-10-CM | POA: Diagnosis not present

## 2018-09-22 NOTE — Patient Instructions (Signed)
miralax for constipation.   About Constipation  Constipation Overview Constipation is the most common gastrointestinal complaint - about 4 million Americans experience constipation and make 2.5 million physician visits a year to get help for the problem.  Constipation can occur when the colon absorbs too much water, the colon's muscle contraction is slow or sluggish, and/or there is delayed transit time through the colon.  The result is stool that is hard and dry.  Indicators of constipation include straining during bowel movements greater than 25% of the time, having fewer than three bowel movements per week, and/or the feeling of incomplete evacuation.  There are established guidelines ([place] II ) for defining constipation. A person needs to have two or more of the following symptoms for at least 12 weeks (not necessarily consecutive) in the preceding 12 months: . Straining in  greater than 25% of bowel movements . Lumpy or hard stools in greater than 25% of bowel movements . Sensation of incomplete emptying in greater than 25% of bowel movements . Sensation of anorectal obstruction/blockade in greater than 25% of bowel movements . Manual maneuvers to help empty greater than 25% of bowel movements (e.g., digital evacuation, support of the pelvic floor)  . Less than  3 bowel movements/week . Loose stools are not present, and criteria for irritable bowel syndrome are insufficient  Common Causes of Constipation . Lack of fiber in your diet . Lack of physical activity . Medications, including iron and calcium supplements  . Dairy intake . Dehydration . Abuse of laxatives  Travel  Irritable Bowel Syndrome  Pregnancy  Luteal phase of menstruation (after ovulation and before menses)  Colorectal problems  Intestinal Dysfunction  Treating Constipation  There are several ways of treating constipation, including changes to diet and exercise, use of laxatives, adjustments to the pelvic  floor, and scheduled toileting.  These treatments include: . increasing fiber and fluids in the diet  . increasing physical activity . learning muscle coordination   learning proper toileting techniques and toileting modifications   designing and sticking  to a toileting schedule     2007, [employer] Doc.22

## 2018-09-22 NOTE — Progress Notes (Signed)
GYNECOLOGY  VISIT   HPI: 53 y.o.   Married Asian Not Hispanic or Latino  female   [phone] with Patient's last menstrual period was 10/06/2012.   here for   Recheck she is not having any issues at this time.  She has some random pain in her RLQ. Tolerable. She is having some issues with constipation. Having a BM 2 x a day, hard, needs to strain.  Voiding okay.   GYNECOLOGIC HISTORY: Patient's last menstrual period was 10/06/2012. Contraception: hysterectomy  Menopausal hormone therapy: Premarin         OB History    Gravida  3   Para  3   Term  3   Preterm      AB      Living  3     SAB      TAB      Ectopic      Multiple      Live Births  3              Patient Active Problem List   Diagnosis Date Noted  . Anemia 08/14/2018  . Bruising 08/14/2018  . Cold sore 08/14/2018  . Sore throat 08/14/2018  . Dysuria 06/10/2018  . Headache syndrome 06/10/2018  . Illiteracy 06/10/2018  . Recent major surgery 05/12/2018  . Subclinical hyperthyroidism 05/07/2018  . Muscle spasm 03/06/2018  . Chronic neck pain 03/06/2018  . Chronic upper back pain 03/06/2018  . Palpitation 03/06/2018  . Abnormal TSH 01/08/2018  . Female bladder prolapse 06/18/2017  . Dizziness 01/13/2017  . Hyperlipidemia 01/13/2017  . Weight gain 12/13/2016  . Edema 12/13/2016  . Pelvic pain in female 10/27/2015  . Chronic right shoulder pain 10/27/2015  . Encounter for health maintenance examination in adult 10/27/2015  . Special screening for malignant neoplasms, colon 10/27/2015  . Screening for breast cancer 10/27/2015    Past Medical History:  Diagnosis Date  . Chronic neck pain   . Hyperlipidemia   . Hypertension   . Shoulder pain   . Subclinical hyperthyroidism 05/07/2018    Past Surgical History:  Procedure Laterality Date  . BILATERAL SALPINGECTOMY Bilateral 05/12/2018   Procedure: BILATERAL SALPINGECTOMY;  Surgeon: Romualdo BolkJertson, Jill Evelyn, MD;  [facility];  Service: Gynecology;  Laterality: Bilateral;  . BLADDER SUSPENSION N/A 05/12/2018   Procedure: TRANSVAGINAL TAPE (TVT) PROCEDURE;  Surgeon: Romualdo BolkJertson, Jill Evelyn, MD;  [facility] [place];  Service: Gynecology;  Laterality: N/A;  . BREAST LUMPECTOMY Right 2010  . CYST EXCISION     upper chest/ breast ([facility]), 1 week hospitalization  . CYSTOCELE REPAIR N/A 05/12/2018   Procedure: ANTERIOR REPAIR (CYSTOCELE) with uterosacral ligament suspension;  Surgeon: Romualdo BolkJertson, Jill Evelyn, MD;  [facility] [place];  Service: Gynecology;  Laterality: N/A;  . CYSTOSCOPY N/A 05/12/2018   Procedure: CYSTOSCOPY;  Surgeon: Romualdo BolkJertson, Jill Evelyn, MD;  [facility] [place];  Service: Gynecology;  Laterality: N/A;  . JEJUNOSTOMY FEEDING TUBE     prior with surgery  . LAPAROSCOPIC VAGINAL HYSTERECTOMY WITH SALPINGECTOMY Bilateral 05/12/2018   Procedure: LAPAROSCOPIC ASSISTED VAGINAL HYSTERECTOMY WITH BILATERAL  SALPINGOOPHRECTOMY AND EXTENSIVE LYSIS OF ADHESIONS;  Surgeon: Romualdo BolkJertson, Jill Evelyn, MD;  [facility] [place];  Service: Gynecology;  Laterality: Bilateral;  . TONSILLECTOMY     Right tonsillectomy, 2wk hospitalization ([facility])  . VAGINAL HYSTERECTOMY N/A 05/12/2018   Procedure: ATTEMPTED HYSTERECTOMY VAGINAL;  Surgeon: Romualdo BolkJertson, Jill Evelyn, MD;  [facility] [place];  Service:  Gynecology;  Laterality: N/A;  TVH/BS/anterior repair/ uterosacral ligament suspension/TVT/ cysto/ 3 hours/ extended recovery bed    Current Outpatient Medications  Medication Sig Dispense Refill  . conjugated estrogens (PREMARIN) vaginal cream 1/2 gram vaginally twice weekly 30 g 1  . ibuprofen (ADVIL,MOTRIN) 800 MG tablet Take 1 tablet (800 mg total) by mouth every 8 (eight) hours as needed. (Patient not taking: Reported on 08/14/2018) 30 tablet 1  . valACYclovir (VALTREX) 1000 MG tablet Daily for suppression, 2 tablets BID x 1 day for flare up 45 tablet  3   No current facility-administered medications for this visit.      ALLERGIES: Hydrocodone  Family History  Problem Relation Age of Onset  . Hypertension Father   . Cancer Neg Hx   . Diabetes Neg Hx   . Heart disease Neg Hx   . Stroke Neg Hx   . Thyroid disease Neg Hx     Social History   Socioeconomic History  . Marital status: Married    Spouse name: Not on file  . Number of children: Not on file  . Years of education: Not on file  . Highest education level: Not on file  Occupational History  . Not on file  Social Needs  . Financial resource strain: Not on file  . Food insecurity:    Worry: Not on file    Inability: Not on file  . Transportation needs:    Medical: Not on file    Non-medical: Not on file  Tobacco Use  . Smoking status: Never Smoker  . Smokeless tobacco: Never Used  Substance and Sexual Activity  . Alcohol use: No    Alcohol/week: 0.0 standard drinks  . Drug use: No  . Sexual activity: Not Currently    Partners: Male    Birth control/protection: None, Post-menopausal  Lifestyle  . Physical activity:    Days per week: Not on file    Minutes per session: Not on file  . Stress: Not on file  Relationships  . Social connections:    Talks on phone: Not on file    Gets together: Not on file    Attends religious service: Not on file    Active member of club or organization: Not on file    Attends meetings of clubs or organizations: Not on file    Relationship status: Not on file  . Intimate partner violence:    Fear of current or ex partner: Not on file    Emotionally abused: Not on file    Physically abused: Not on file    Forced sexual activity: Not on file  Other Topics Concern  . Not on file  Social History Narrative   Lives with husband, 3 children, ages 41yo, 47yo, 80yo, works in Designer, fashion/clothing, various jobs, some exercise with walking    Review of Systems  All other systems reviewed and are negative.   PHYSICAL EXAMINATION:    BP  110/64   Pulse 66   Resp 14   Ht 5\' 4"  (1.626 m)   Wt 135 lb (61.2 kg)   LMP 10/06/2012   BMI 23.17 kg/m     General appearance: alert, cooperative and appears stated age Abdomen: soft, mildly tender in the lower mid abdomen and RLQ, no rebound, no guarding; non distended, no masses,  no organomegaly  Pelvic: External genitalia:  no lesions              Urethra:  normal appearing urethra with no masses, tenderness  or lesions              Bartholins and Skenes: normal                 Vagina: normal appearing vagina with normal color and discharge, no lesions. No prolapse              Cervix: absent              Bimanual Exam:  Uterus:  uterus absent              Adnexa: no mass, fullness, tenderness              Bladder: mildly tender (no symptoms)  Chaperone was present for exam.  ASSESSMENT Constipation Lower abdominal pain, suspect from her constipation H/O LAVH/BS/anterior repair, uterosacral ligament suspension and TVT  PLAN Discussed constipation Increase fruit, fiber and fluids, try miralax Discussed the importance of not straining on her prolapse repair F/U for an annual exam in the summer   An After Visit Summary was printed and given to the patient.

## 2018-09-30 ENCOUNTER — Encounter: Payer: Self-pay | Admitting: Endocrinology

## 2018-09-30 ENCOUNTER — Ambulatory Visit (INDEPENDENT_AMBULATORY_CARE_PROVIDER_SITE_OTHER): Payer: BLUE CROSS/BLUE SHIELD | Admitting: Endocrinology

## 2018-09-30 VITALS — BP 106/70 | HR 83 | Ht 64.0 in | Wt 135.6 lb

## 2018-09-30 DIAGNOSIS — E0590 Thyrotoxicosis, unspecified without thyrotoxic crisis or storm: Secondary | ICD-10-CM | POA: Diagnosis not present

## 2018-09-30 DIAGNOSIS — R51 Headache: Secondary | ICD-10-CM

## 2018-09-30 DIAGNOSIS — R519 Headache, unspecified: Secondary | ICD-10-CM | POA: Insufficient documentation

## 2018-09-30 LAB — CBC WITH DIFFERENTIAL/PLATELET
BASOS PCT: 0.3 % (ref 0.0–3.0)
Basophils Absolute: 0 10*3/uL (ref 0.0–0.1)
EOS PCT: 3.7 % (ref 0.0–5.0)
Eosinophils Absolute: 0.2 10*3/uL (ref 0.0–0.7)
HEMATOCRIT: 38.6 % (ref 36.0–46.0)
Hemoglobin: 12.6 g/dL (ref 12.0–15.0)
LYMPHS ABS: 2.6 10*3/uL (ref 0.7–4.0)
Lymphocytes Relative: 54.6 % — ABNORMAL HIGH (ref 12.0–46.0)
MCHC: 32.7 g/dL (ref 30.0–36.0)
MCV: 87.3 fl (ref 78.0–100.0)
MONOS PCT: 5.5 % (ref 3.0–12.0)
Monocytes Absolute: 0.3 10*3/uL (ref 0.1–1.0)
NEUTROS ABS: 1.7 10*3/uL (ref 1.4–7.7)
NEUTROS PCT: 35.9 % — AB (ref 43.0–77.0)
PLATELETS: 251 10*3/uL (ref 150.0–400.0)
RBC: 4.43 Mil/uL (ref 3.87–5.11)
RDW: 14.1 % (ref 11.5–15.5)
WBC: 4.8 10*3/uL (ref 4.0–10.5)

## 2018-09-30 LAB — T4, FREE: FREE T4: 1.12 ng/dL (ref 0.60–1.60)

## 2018-09-30 LAB — SEDIMENTATION RATE: Sed Rate: 25 mm/hr (ref 0–30)

## 2018-09-30 LAB — TSH: TSH: 0.11 u[IU]/mL — ABNORMAL LOW (ref 0.35–4.50)

## 2018-09-30 NOTE — Patient Instructions (Signed)
Blood tests are requested for you today.  We'll let you know about the results.  Based on the results, we'll probably need to resume the methimazole.   If ever you have fever while taking methimazole, stop it and call us, even if the reason is obvious, because of the risk of a rare side-effect. Please come back for a follow-up appointment in 1 month.   ????? ???? ?? ???? ?????????? ?????? ???? ???? ?????? ????????? ???????? ?????? ??????? ????????, ?????? ?????? ?????????? ??????? ???? ???? ???? ???? ???????-???????????? ?????? ?????? ?????? ??? ???????? ??? ??? ???????? ??????? ???? ??? ???? ????? ???? ???, ??? ??? ???? ???? ??? ?????? ?? ????? ???? ??? 1 ???? ???-?? ?????????????????? ???? ???? ?????

## 2018-09-30 NOTE — Progress Notes (Signed)
Subjective:    Patient ID: Becky Goodwin, female    DOB: 01/03/1966, 53 y.o.   MRN: [id number]  HPI Pt returns for f/u of hyperthyroidism (dx'ed 2014; she has never had thyroid imaging; she chose tapazole rx).  She has slight headache, worst at the back of the head, but no assoc visual loss.  She stopped tapazole.   Past Medical History:  Diagnosis Date  . Chronic neck pain   . Hyperlipidemia   . Hypertension   . Shoulder pain   . Subclinical hyperthyroidism 05/07/2018    Past Surgical History:  Procedure Laterality Date  . BILATERAL SALPINGECTOMY Bilateral 05/12/2018   Procedure: BILATERAL SALPINGECTOMY;  Surgeon: Romualdo Bolk, MD;  Location[facility];  Service: Gynecology;  Laterality: Bilateral;  . BLADDER SUSPENSION N/A 05/12/2018   Procedure: TRANSVAGINAL TAPE (TVT) PROCEDURE;  Surgeon: Romualdo Bolk, MD;  Location: [facility];  Service: Gynecology;  Laterality: N/A;  . BREAST LUMPECTOMY Right 2010  . CYST EXCISION     upper chest/ breast ([place]), 1 week hospitalization  . CYSTOCELE REPAIR N/A 05/12/2018   Procedure: ANTERIOR REPAIR (CYSTOCELE) with uterosacral ligament suspension;  Surgeon: Romualdo Bolk, MD;  [facility];  Service: Gynecology;  Laterality: N/A;  . CYSTOSCOPY N/A 05/12/2018   Procedure: CYSTOSCOPY;  Surgeon: Romualdo Bolk, MD;  [facility];  Service: Gynecology;  Laterality: N/A;  . JEJUNOSTOMY FEEDING TUBE     prior with surgery  . LAPAROSCOPIC VAGINAL HYSTERECTOMY WITH SALPINGECTOMY Bilateral 05/12/2018   Procedure: LAPAROSCOPIC ASSISTED VAGINAL HYSTERECTOMY WITH BILATERAL  SALPINGOOPHRECTOMY AND EXTENSIVE LYSIS OF ADHESIONS;  Surgeon: Romualdo Bolk, MD;  Location[facility] ;  Service: Gynecology;  Laterality: Bilateral;  . TONSILLECTOMY     Right tonsillectomy, 2wk hospitalization ([facility])  . VAGINAL HYSTERECTOMY N/A 05/12/2018   Procedure: ATTEMPTED HYSTERECTOMY VAGINAL;  Surgeon: Romualdo Bolk, MD;  Location: [facility];  Service: Gynecology;  Laterality: N/A;  TVH/BS/anterior repair/ uterosacral ligament suspension/TVT/ cysto/ 3 hours/ extended recovery bed    Social History   Socioeconomic History  . Marital status: Married    Spouse name: Not on file  . Number of children: Not on file  . Years of education: Not on file  . Highest education level: Not on file  Occupational History  . Not on file  Social Needs  . Financial resource strain: Not on file  . Food insecurity:    Worry: Not on file    Inability: Not on file  . Transportation needs:    Medical: Not on file    Non-medical: Not on file  Tobacco Use  . Smoking status: Never Smoker  . Smokeless tobacco: Never Used  Substance and Sexual Activity  . Alcohol use: No    Alcohol/week: 0.0 standard drinks  . Drug use: No  . Sexual activity: Not Currently    Partners: Male    Birth control/protection: None, Post-menopausal  Lifestyle  . Physical activity:    Days per week: Not on file    Minutes per session: Not on file  . Stress: Not on file  Relationships  . Social connections:    Talks on phone: Not on file    Gets together: Not on file    Attends religious service: Not on file    Active member of club or organization: Not on file    Attends meetings of clubs or organizations: Not on file  Relationship status: Not on file  . Intimate partner violence:    Fear of current or ex partner: Not on file    Emotionally abused: Not on file    Physically abused: Not on file    Forced sexual activity: Not on file  Other Topics Concern  . Not on file  Social History Narrative   Lives with husband, 3 children, ages 67yo, 72yo, 74yo, works in Designer, fashion/clothing, various jobs, some exercise with walking    Current Outpatient Medications on File Prior to Visit  Medication Sig Dispense Refill  . conjugated estrogens (PREMARIN)  vaginal cream 1/2 gram vaginally twice weekly 30 g 1  . ibuprofen (ADVIL,MOTRIN) 800 MG tablet Take 1 tablet (800 mg total) by mouth every 8 (eight) hours as needed. 30 tablet 1  . valACYclovir (VALTREX) 1000 MG tablet Daily for suppression, 2 tablets BID x 1 day for flare up 45 tablet 3   No current facility-administered medications on file prior to visit.     Allergies  Allergen Reactions  . Hydrocodone Nausea And Vomiting    Family History  Problem Relation Age of Onset  . Hypertension Father   . Cancer Neg Hx   . Diabetes Neg Hx   . Heart disease Neg Hx   . Stroke Neg Hx   . Thyroid disease Neg Hx     BP 106/70 (BP Location: Right Arm, Patient Position: Sitting, Cuff Size: Normal)   Pulse 83   Ht 5\' 4"  (1.626 m)   Wt 135 lb 9.6 oz (61.5 kg)   LMP 10/06/2012   SpO2 97%   BMI 23.28 kg/m   Review of Systems Denies fever and rash.      Objective:   Physical Exam VITAL SIGNS:  See vs page GENERAL: no distress Neck: supple.  Thyroid is 2 times normal size on the right, and normal on the left.  No palpable nodule.     Lab Results  Component Value Date   TSH 0.11 (L) 09/30/2018   T4TOTAL 9.9 05/07/2018      Assessment & Plan:  Hyperthyroidism; he needs to resume tapazole.  I have sent a prescription to your pharmacy Headache: new.    Patient Instructions  Blood tests are requested for you today.  We'll let you know about the results.  Based on the results, we'll probably need to resume the methimazole.   If ever you have fever while taking methimazole, stop it and call us, even if the reason is obvious, because of the risk of a rare side-effect. Please come back for a follow-up appointment in 1 month.   ????? ???? ?? ???? ?????????? ?????? ???? ???? ?????? ????????? ???????? ?????? ??????? ????????, ?????? ?????? ?????????? ??????? ???? ???? ???? ???? ???????-???????????? ?????? ?????? ?????? ??? ???????? ??? ??? ???????? ??????? ???? ??? ???? ????? ???? ???, ???  ??? ???? ???? ??? ?????? ?? ????? ???? ??? 1 ???? ???-?? ?????????????????? ???? ???? ?????

## 2018-10-01 ENCOUNTER — Encounter: Payer: Self-pay | Admitting: Endocrinology

## 2018-10-01 MED ORDER — METHIMAZOLE 10 MG PO TABS: 10.0000 mg | ORAL_TABLET | Freq: Every day | ORAL | 5 refills | Status: DC

## 2018-10-29 ENCOUNTER — Ambulatory Visit: Payer: BLUE CROSS/BLUE SHIELD | Admitting: Endocrinology

## 2018-10-29 ENCOUNTER — Telehealth: Payer: Self-pay | Admitting: Endocrinology

## 2018-10-29 NOTE — Telephone Encounter (Signed)
Please schedule f/u appt for next available appointment  

## 2018-10-29 NOTE — Telephone Encounter (Signed)
Please refer to Dr. Ellison's response 

## 2018-10-29 NOTE — Telephone Encounter (Signed)
Patient no showed today's appt. Please advise on how to follow up. °A. No follow up necessary. °B. Follow up urgent. Contact patient immediately. °C. Follow up necessary. Contact patient and schedule visit in ___ days. °D. Follow up advised. Contact patient and schedule visit in ____weeks. ° °Would you like the NS fee to be applied to this visit? ° °

## 2018-10-29 NOTE — Telephone Encounter (Signed)
Called patient to reschedule missed appointment. Unable to leave message/ voicemail box not set up.

## 2018-11-02 NOTE — Telephone Encounter (Signed)
error 

## 2018-11-27 ENCOUNTER — Ambulatory Visit (HOSPITAL_COMMUNITY)
Admission: EM | Admit: 2018-11-27 | Discharge: 2018-11-27 | Disposition: A | Discharge status: Home / Self Care | Payer: BLUE CROSS/BLUE SHIELD | Attending: Family Medicine | Admitting: Family Medicine

## 2018-11-27 ENCOUNTER — Ambulatory Visit: Payer: BLUE CROSS/BLUE SHIELD | Admitting: Medical

## 2018-11-27 ENCOUNTER — Encounter (HOSPITAL_COMMUNITY): Payer: Self-pay

## 2018-11-27 ENCOUNTER — Other Ambulatory Visit: Payer: Self-pay

## 2018-11-27 DIAGNOSIS — M542 Cervicalgia: Secondary | ICD-10-CM

## 2018-11-27 DIAGNOSIS — R52 Pain, unspecified: Secondary | ICD-10-CM

## 2018-11-27 DIAGNOSIS — R112 Nausea with vomiting, unspecified: Secondary | ICD-10-CM

## 2018-11-27 DIAGNOSIS — G4489 Other headache syndrome: Secondary | ICD-10-CM

## 2018-11-27 MED ORDER — ONDANSETRON 4 MG PO TBDP: 4.0000 mg | ORAL_TABLET | Freq: Once | ORAL | Status: DC

## 2018-11-27 MED ORDER — HYDROXYZINE HCL 25 MG PO TABS: 25.0000 mg | ORAL_TABLET | Freq: Every day | ORAL | 0 refills | Status: DC

## 2018-11-27 MED ORDER — NAPROXEN 500 MG PO TABS: 500.0000 mg | ORAL_TABLET | Freq: Two times a day (BID) | ORAL | 0 refills | Status: DC

## 2018-11-27 MED ORDER — METOCLOPRAMIDE HCL 10 MG PO TABS: 10.0000 mg | ORAL_TABLET | Freq: Four times a day (QID) | ORAL | 0 refills | Status: DC

## 2018-11-27 MED ORDER — KETOROLAC TROMETHAMINE 60 MG/2ML IM SOLN
60.0000 mg | Freq: Once | INTRAMUSCULAR | Status: AC
  Administered 2018-11-27: 60 mg via INTRAMUSCULAR

## 2018-11-27 MED ORDER — METOCLOPRAMIDE HCL 5 MG/ML IJ SOLN
INTRAMUSCULAR | Status: AC
  Filled 2018-11-27: qty 2

## 2018-11-27 MED ORDER — METOCLOPRAMIDE HCL 5 MG/ML IJ SOLN
5.0000 mg | Freq: Once | INTRAMUSCULAR | Status: AC
  Administered 2018-11-27: 5 mg via INTRAMUSCULAR

## 2018-11-27 MED ORDER — DEXAMETHASONE SODIUM PHOSPHATE 10 MG/ML IJ SOLN
INTRAMUSCULAR | Status: AC
  Filled 2018-11-27: qty 1

## 2018-11-27 MED ORDER — KETOROLAC TROMETHAMINE 60 MG/2ML IM SOLN
INTRAMUSCULAR | Status: AC
  Filled 2018-11-27: qty 2

## 2018-11-27 MED ORDER — DEXAMETHASONE SODIUM PHOSPHATE 10 MG/ML IJ SOLN
10.0000 mg | Freq: Once | INTRAMUSCULAR | Status: AC
  Administered 2018-11-27: 10 mg via INTRAMUSCULAR

## 2018-11-27 NOTE — ED Triage Notes (Signed)
Patient presents to Urgent Care with complaints of multiple complaints, including posterior headache,generalized body aches, chills,nausea, loss of taste, insomnia (for three weeks) since 3 days ago. Patient states they would also like their cholesterol checked because it is normally checked every 6 months and it has been 8. Pt came here because there is no available appt with her PCP.

## 2018-11-27 NOTE — ED Notes (Signed)
Patient now reporting that she also had one episode of vomiting this morning.

## 2018-11-27 NOTE — Discharge Instructions (Addendum)
I have prescribed Reglan 10 mg for headache and nausea to take every 6 hours as needed. I have also prescribed Naproxen which must be taken with food for body aches and headache. To help with rest and sleep, I have prescribed hydroxyzine 25 mg once nightly at bedtime. I recommend rest, hydration, and eat food as tolerated. If headache or other symptoms worsen, please go immediately to the emergency department.

## 2018-11-27 NOTE — ED Provider Notes (Signed)
[facility]    CSN: [id number] Arrival date & time: 11/27/18  1237     History   Chief Complaint No chief complaint on file.   HPI Becky Goodwin is a 53 y.o. female.   HPI  Medical history significant for hyperthyroidism, hyperlipidemia, hypertension, and chronic neck pain, presents today with a complaint of headache and multiple associated symptoms. Onset of headache x 3 days ago. Headache is radiating from her upper neck and extends to the occipital region of her head. Characterizes the pain as aching and throbbing. Current pain level is 8/10. She also endorses generalized body aches occurring for 3 days. Only attempted relief with one dose of ibuprofen yesterday. She is has also experienced nausea with 1 episode of vomiting 30 minutes prior to arrival today. She has not experienced cough, fever or shortness of breath. She has had new onset of insomnia x 3 weeks resulting in very poor sleep quality. She was scheduled for a routine health maintenance visit with her PCP , however this appointment was cancelled due to COVID-19. She is requesting a lipid panel.  Past Medical History:  Diagnosis Date  . Chronic neck pain   . Hyperlipidemia   . Hypertension   . Shoulder pain   . Subclinical hyperthyroidism 05/07/2018    Patient Active Problem List   Diagnosis Date Noted  . Headache 09/30/2018  . Anemia 08/14/2018  . Bruising 08/14/2018  . Cold sore 08/14/2018  . Sore throat 08/14/2018  . Dysuria 06/10/2018  . Headache syndrome 06/10/2018  . Illiteracy 06/10/2018  . Recent major surgery 05/12/2018  . Subclinical hyperthyroidism 05/07/2018  . Muscle spasm 03/06/2018  . Chronic neck pain 03/06/2018  . Chronic upper back pain 03/06/2018  . Palpitation 03/06/2018  . Abnormal TSH 01/08/2018  . Female bladder prolapse 06/18/2017  . Dizziness 01/13/2017  . Hyperlipidemia 01/13/2017  . Weight gain 12/13/2016  . Edema 12/13/2016  . Pelvic pain in female 10/27/2015  .  Chronic right shoulder pain 10/27/2015  . Encounter for health maintenance examination in adult 10/27/2015  . Special screening for malignant neoplasms, colon 10/27/2015  . Screening for breast cancer 10/27/2015    Past Surgical History:  Procedure Laterality Date  . BILATERAL SALPINGECTOMY Bilateral 05/12/2018   Procedure: BILATERAL SALPINGECTOMY;  Surgeon: Romualdo Bolk, MD;  Location: [facility];  Service: Gynecology;  Laterality: Bilateral;  . BLADDER SUSPENSION N/A 05/12/2018   Procedure: TRANSVAGINAL TAPE (TVT) PROCEDURE;  Surgeon: Romualdo Bolk, MD;  Location: [facility];  Service: Gynecology;  Laterality: N/A;  . BREAST LUMPECTOMY Right 2010  . CYST EXCISION     upper chest/ breast ([place]), 1 week hospitalization  . CYSTOCELE REPAIR N/A 05/12/2018   Procedure: ANTERIOR REPAIR (CYSTOCELE) with uterosacral ligament suspension;  Surgeon: Romualdo Bolk, MD;  Location: [facility];  Service: Gynecology;  Laterality: N/A;  . CYSTOSCOPY N/A 05/12/2018   Procedure: CYSTOSCOPY;  Surgeon: Romualdo Bolk, MD;  Location: [facility];  Service: Gynecology;  Laterality: N/A;  . JEJUNOSTOMY FEEDING TUBE     prior with surgery  . LAPAROSCOPIC VAGINAL HYSTERECTOMY WITH SALPINGECTOMY Bilateral 05/12/2018   Procedure: LAPAROSCOPIC ASSISTED VAGINAL HYSTERECTOMY WITH BILATERAL  SALPINGOOPHRECTOMY AND EXTENSIVE LYSIS OF ADHESIONS;  Surgeon: Romualdo Bolk, MD;  Location: [facility];  Service: Gynecology;  Laterality: Bilateral;  . TONSILLECTOMY     Right tonsillectomy, 2wk hospitalization ([place])  . VAGINAL HYSTERECTOMY N/A 05/12/2018   Procedure: ATTEMPTED HYSTERECTOMY VAGINAL;  Surgeon: Romualdo BolkJertson, Jill Evelyn, MD;  [facility] [place];  Service: Gynecology;  Laterality: N/A;  TVH/BS/anterior repair/ uterosacral ligament suspension/TVT/ cysto/ 3 hours/ extended recovery bed    OB  History    Gravida  3   Para  3   Term  3   Preterm      AB      Living  3     SAB      TAB      Ectopic      Multiple      Live Births  3            Home Medications    Prior to Admission medications   Medication Sig Start Date End Date Taking? Authorizing Provider  conjugated estrogens (PREMARIN) vaginal cream 1/2 gram vaginally twice weekly 07/28/18   Romualdo BolkJertson, Jill Evelyn, MD  ibuprofen (ADVIL,MOTRIN) 800 MG tablet Take 1 tablet (800 mg total) by mouth every 8 (eight) hours as needed. 05/21/18   Romualdo BolkJertson, Jill Evelyn, MD  methimazole (TAPAZOLE) 10 MG tablet Take 1 tablet (10 mg total) by mouth daily. 10/01/18   Romero BellingEllison, Sean, MD  valACYclovir (VALTREX) 1000 MG tablet Daily for suppression, 2 tablets BID x 1 day for flare up 08/14/18   Tysinger, Kermit Baloavid S, PA-C    Family History Family History  Problem Relation Age of Onset  . Hypertension Father   . Cancer Neg Hx   . Diabetes Neg Hx   . Heart disease Neg Hx   . Stroke Neg Hx   . Thyroid disease Neg Hx     Social History Social History   Tobacco Use  . Smoking status: Never Smoker  . Smokeless tobacco: Never Used  Substance Use Topics  . Alcohol use: No    Alcohol/week: 0.0 standard drinks  . Drug use: No     Allergies   Hydrocodone  Review of Systems Review of Systems  Pertinent negatives listed in HPI Physical Exam Triage Vital Signs ED Triage Vitals  Enc Vitals Group     BP 11/27/18 1256 (!) 132/92     Pulse Rate 11/27/18 1256 83     Resp 11/27/18 1256 17     Temp 11/27/18 1256 98 F (36.7 C)     Temp Source 11/27/18 1256 Oral     SpO2 11/27/18 1256 98 %     Weight --      Height --      Head Circumference --      Peak Flow --      Pain Score 11/27/18 1241 6     Pain Loc --      Pain Edu? --      Excl. in GC? --    No data found.  Updated Vital Signs BP (!) 132/92 (BP Location: Left Arm)   Pulse 83   Temp 98 F (36.7 C) (Oral)   Resp 17   LMP 10/06/2012   SpO2 98%    Visual Acuity Right Eye Distance:   Left Eye Distance:   Bilateral Distance:    Right Eye Near:   Left Eye Near:    Bilateral Near:     Physical Exam Constitutional: Patient appears well-developed and well-nourished.  HENT: Normocephalic, atraumatic, External right and left ear normal. Oropharynx is clear and moist.  Eyes: Conjunctivae and EOM are normal. PERRLA, no scleral icterus. Neck: Normal ROM. Neck supple. No JVD. No tracheal deviation. No thyromegaly. CVS: RRR, S1/S2 +, no murmurs, no gallops, no  carotid bruit.  Pulmonary: Effort and breath sounds normal, no stridor, rhonchi, wheezes, rales.  Abdominal: Soft. BS +, no distension, tenderness, rebound or guarding.  Musculoskeletal: Normal range of motion. No edema and no tenderness.  Neuro: Alert. Normal reflexes, muscle tone coordination. No cranial nerve deficit. Skin: Skin is warm and dry. No rash noted. Not diaphoretic. No erythema. No pallor. Psychiatric: Normal mood and affect. Behavior, judgment, thought content normal. UC Treatments / Results  Labs (all labs ordered are listed, but only abnormal results are displayed) Labs Reviewed - No data to display  EKG None  Radiology No results found.  Procedures Procedures (including critical care time)  Medications Ordered in UC Medications - No data to display  Initial Impression / Assessment and Plan / UC Course  I have reviewed the triage vital signs and the nursing notes.  Pertinent labs & imaging results that were available during my care of the patient were reviewed by me and considered in my medical decision making (see chart for details).   Treating for intractable headache with associated nausea and vomiting. Patient given a headache cocktail which improved headache pain. Prescribing Reglan 10 mg every 6 hours as needed for headache and nausea. For body aches and headache prescribed naproxen 500 mg twice daily PRN. Red flags discussed. Advised to follow-up  with PCP for hyperlipidemia management. Patient verbalized understanding and agreement with plan. Final Clinical Impressions(s) / UC Diagnoses   Final diagnoses:  Other headache syndrome  Neck pain  Generalized body aches  Nausea and vomiting, intractability of vomiting not specified, unspecified vomiting type     Discharge Instructions     I have prescribed Reglan 10 mg for headache and nausea to take every 6 hours as needed. I have also prescribed Naproxen which must be taken with food for body aches and headache. I recommend rest, hydration, and eat food as tolerated. If headache or other symptoms worsen, please go immediately to the emergency department.    ED Prescriptions    Medication Sig Dispense Auth. Provider   metoCLOPramide (REGLAN) 10 MG tablet Take 1 tablet (10 mg total) by mouth every 6 (six) hours for 5 days. 20 tablet Bing Neighbors, FNP   naproxen (NAPROSYN) 500 MG tablet Take 1 tablet (500 mg total) by mouth 2 (two) times daily with a meal. 30 tablet Bing Neighbors, FNP   hydrOXYzine (ATARAX/VISTARIL) 25 MG tablet Take 1 tablet (25 mg total) by mouth at bedtime. 12 tablet Bing Neighbors, FNP     Controlled Substance Prescriptions [place] Controlled Substance Registry consulted? Not Applicable   Bing Neighbors, FNP 11/27/18 1811

## 2019-02-24 ENCOUNTER — Encounter: Payer: Self-pay | Admitting: Obstetrics and Gynecology

## 2019-02-24 ENCOUNTER — Telehealth: Payer: Self-pay | Admitting: Obstetrics and Gynecology

## 2019-02-24 ENCOUNTER — Other Ambulatory Visit: Payer: Self-pay

## 2019-02-24 ENCOUNTER — Ambulatory Visit: Payer: BLUE CROSS/BLUE SHIELD | Admitting: Obstetrics and Gynecology

## 2019-02-24 VITALS — BP 136/86 | HR 70 | Temp 99.2°F | Resp 14 | Ht 64.0 in | Wt 140.0 lb

## 2019-02-24 DIAGNOSIS — N760 Acute vaginitis: Secondary | ICD-10-CM | POA: Diagnosis not present

## 2019-02-24 DIAGNOSIS — R300 Dysuria: Secondary | ICD-10-CM

## 2019-02-24 DIAGNOSIS — N949 Unspecified condition associated with female genital organs and menstrual cycle: Secondary | ICD-10-CM

## 2019-02-24 DIAGNOSIS — N9489 Other specified conditions associated with female genital organs and menstrual cycle: Secondary | ICD-10-CM

## 2019-02-24 LAB — POCT URINALYSIS DIPSTICK
Bilirubin, UA: NEGATIVE
Blood, UA: NEGATIVE
Glucose, UA: NEGATIVE
Ketones, UA: NEGATIVE
Leukocytes, UA: NEGATIVE
Nitrite, UA: NEGATIVE
Protein, UA: NEGATIVE
Urobilinogen, UA: 0.2 E.U./dL
pH, UA: 5 (ref 5.0–8.0)

## 2019-02-24 MED ORDER — BETAMETHASONE VALERATE 0.1 % EX OINT: TOPICAL_OINTMENT | CUTANEOUS | 0 refills | Status: DC

## 2019-02-24 MED ORDER — PREMARIN 0.625 MG/GM VA CREA: TOPICAL_CREAM | VAGINAL | 1 refills | Status: DC

## 2019-02-24 NOTE — Progress Notes (Signed)
GYNECOLOGY  VISIT   HPI: 53 y.o.   Married Asian Not Hispanic or Latino  female   [phone] with Patient's last menstrual period was 10/06/2012.   here for lower back pain, pain and burning with urination, external itching. Symptoms started 3 days ago. She had some difficulty starting her stream 3 days ago, drank lots of water and was able to void, she feels she is emptying her bladder. Since yesterday her pain got much worse, continues pain, burning. Slightly worse burning when she voids. No frequency or urgency to void. No vaginal bulge. She hasn't been using her estrogen cream, she ran out 3 months ago. Not sexually active for 3 years. She has issues with constipation, having a BM every day, sometimes needs to strain.   She c/o vaginal itching x 3 days, correlates with the vaginal burning.    H/O LAVH/LOA/Anterior repair/uterosacral ligament suspension and TVT last year.   GYNECOLOGIC HISTORY: Patient's last menstrual period was 10/06/2012. Contraception: hysterectomy  Menopausal hormone therapy: premarin vaginal cream         OB History    Gravida  3   Para  3   Term  3   Preterm      AB      Living  3     SAB      TAB      Ectopic      Multiple      Live Births  3              Patient Active Problem List   Diagnosis Date Noted  . Headache 09/30/2018  . Anemia 08/14/2018  . Bruising 08/14/2018  . Cold sore 08/14/2018  . Sore throat 08/14/2018  . Dysuria 06/10/2018  . Headache syndrome 06/10/2018  . Illiteracy 06/10/2018  . Recent major surgery 05/12/2018  . Subclinical hyperthyroidism 05/07/2018  . Muscle spasm 03/06/2018  . Chronic neck pain 03/06/2018  . Chronic upper back pain 03/06/2018  . Palpitation 03/06/2018  . Abnormal TSH 01/08/2018  . Female bladder prolapse 06/18/2017  . Dizziness 01/13/2017  . Hyperlipidemia 01/13/2017  . Weight gain 12/13/2016  . Edema 12/13/2016  . Pelvic pain in female 10/27/2015  . Chronic right shoulder pain  10/27/2015  . Encounter for health maintenance examination in adult 10/27/2015  . Special screening for malignant neoplasms, colon 10/27/2015  . Screening for breast cancer 10/27/2015    Past Medical History:  Diagnosis Date  . Chronic neck pain   . Hyperlipidemia   . Hypertension   . Shoulder pain   . Subclinical hyperthyroidism 05/07/2018    Past Surgical History:  Procedure Laterality Date  . BILATERAL SALPINGECTOMY Bilateral 05/12/2018   Procedure: BILATERAL SALPINGECTOMY;  Surgeon: Romualdo BolkJertson,  Evelyn, MD;  [facility] [place];  Service: Gynecology;  Laterality: Bilateral;  . BLADDER SUSPENSION N/A 05/12/2018   Procedure: TRANSVAGINAL TAPE (TVT) PROCEDURE;  Surgeon: Romualdo BolkJertson,  Evelyn, MD;  [facility] [place];  Service: Gynecology;  Laterality: N/A;  . BREAST LUMPECTOMY Right 2010  . CYST EXCISION     upper chest/ breast ([place]), 1 week hospitalization  . CYSTOCELE REPAIR N/A 05/12/2018   Procedure: ANTERIOR REPAIR (CYSTOCELE) with uterosacral ligament suspension;  Surgeon: Romualdo BolkJertson,  Evelyn, MD;  [facility] [place];  Service: Gynecology;  Laterality: N/A;  . CYSTOSCOPY N/A 05/12/2018   Procedure: CYSTOSCOPY;  Surgeon: Romualdo BolkJertson,  Evelyn, MD;  [facility] [place];  Service: Gynecology;  Laterality: N/A;  . JEJUNOSTOMY FEEDING TUBE  prior with surgery  . LAPAROSCOPIC VAGINAL HYSTERECTOMY WITH SALPINGECTOMY Bilateral 05/12/2018   Procedure: LAPAROSCOPIC ASSISTED VAGINAL HYSTERECTOMY WITH BILATERAL  SALPINGOOPHRECTOMY AND EXTENSIVE LYSIS OF ADHESIONS;  Surgeon: Romualdo BolkJertson,  Evelyn, MD;  [facility] [place];  Service: Gynecology;  Laterality: Bilateral;  . TONSILLECTOMY     Right tonsillectomy, 2wk hospitalization ([place])  . VAGINAL HYSTERECTOMY N/A 05/12/2018   Procedure: ATTEMPTED HYSTERECTOMY VAGINAL;  Surgeon: Romualdo BolkJertson,  Evelyn, MD;  [facility] [place];  Service:  Gynecology;  Laterality: N/A;  TVH/BS/anterior repair/ uterosacral ligament suspension/TVT/ cysto/ 3 hours/ extended recovery bed    Current Outpatient Medications  Medication Sig Dispense Refill  . Docusate Calcium (STOOL SOFTENER PO) Take by mouth.    . conjugated estrogens (PREMARIN) vaginal cream 1/2 gram vaginally twice weekly (Patient not taking: Reported on 02/24/2019) 30 g 1  . valACYclovir (VALTREX) 1000 MG tablet Daily for suppression, 2 tablets BID x 1 day for flare up (Patient not taking: Reported on 02/24/2019) 45 tablet 3   No current facility-administered medications for this visit.      ALLERGIES: Hydrocodone  Family History  Problem Relation Age of Onset  . Hypertension Father   . Cancer Neg Hx   . Diabetes Neg Hx   . Heart disease Neg Hx   . Stroke Neg Hx   . Thyroid disease Neg Hx     Social History   Socioeconomic History  . Marital status: Married    Spouse name: Not on file  . Number of children: Not on file  . Years of education: Not on file  . Highest education level: Not on file  Occupational History  . Not on file  Social Needs  . Financial resource strain: Not on file  . Food insecurity    Worry: Not on file    Inability: Not on file  . Transportation needs    Medical: Not on file    Non-medical: Not on file  Tobacco Use  . Smoking status: Never Smoker  . Smokeless tobacco: Never Used  Substance and Sexual Activity  . Alcohol use: No    Alcohol/week: 0.0 standard drinks  . Drug use: No  . Sexual activity: Not Currently    Partners: Male    Birth control/protection: None, Post-menopausal  Lifestyle  . Physical activity    Days per week: Not on file    Minutes per session: Not on file  . Stress: Not on file  Relationships  . Social Musicianconnections    Talks on phone: Not on file    Gets together: Not on file    Attends religious service: Not on file    Active member of club or organization: Not on file    Attends meetings of clubs or  organizations: Not on file    Relationship status: Not on file  . Intimate partner violence    Fear of current or ex partner: Not on file    Emotionally abused: Not on file    Physically abused: Not on file    Forced sexual activity: Not on file  Other Topics Concern  . Not on file  Social History Narrative   Lives with husband, 3 children, ages 53yo, 53yo, 53yo, works in Designer, fashion/clothingtextiles, various jobs, some exercise with walking    Review of Systems  Constitutional: Negative.   HENT: Negative.   Eyes: Negative.   Respiratory: Negative.   Cardiovascular: Negative.   Gastrointestinal: Negative.   Genitourinary: Positive for dysuria.  Itching   Musculoskeletal: Negative.   Skin: Negative.   Neurological: Negative.   Endo/Heme/Allergies: Negative.   Psychiatric/Behavioral: Negative.     PHYSICAL EXAMINATION:    BP 136/86 (BP Location: Left Arm, Patient Position: Sitting, Cuff Size: Normal)   Pulse 70   Temp 99.2 F (37.3 C) (Temporal)   Resp 14   Ht 5\' 4"  (1.626 m)   Wt 140 lb (63.5 kg)   LMP 10/06/2012   BMI 24.03 kg/m     General appearance: alert, cooperative and appears stated age Abdomen: soft, non-tender; non distended, no masses,  no organomegaly Back: mildly tender in her mid lower back.   Pelvic: External genitalia:  no lesions, mild atrophy, mild erythema              Urethra:  normal appearing urethra with no masses, tenderness or lesions              Bartholins and Skenes: normal                 Vagina: erythematous, atrophic appearing vagina with a slight increase in yellow, creamy vaginal discharge.  Vaginal cuff is well supported             Cervix: absent              Bimanual Exam:  Uterus:  uterus absent              Adnexa: no mass, fullness, tenderness              Bladder: not tender  Chaperone was present for exam.  Wet prep: ? clue, no trich, + wbc, +parabasilar cells KOH: ? one yeast PH: 5   ASSESSMENT Vulvovaginitis, slides not  clear Vaginal atrophy Dysuria, suspect more external than internal. Negative urine dip    PLAN Affirm sent Restart premarin vaginal cream. Use 1/2 gram nightly x 7 days, then 2 x a week Valisone for external discomfort Vulvar skin care reviewed   An After Visit Summary was printed and given to the patient.  Patient was seen with the assistance of an interpreter.

## 2019-02-24 NOTE — Telephone Encounter (Signed)
Spoke with patients daughter, Hart Rochester, ok per dpr. Reports her mom was unable to void during the night a few days ago, is now experiencing dysuria and increased lower abdominal discomfort. Voiding small amounts. Denies vaginal d/c, flank pain, blood in urine, fever/chills. Requesting OV. Advised I will review with Dr. Talbert Nan and return call, daughter agreeable.   Call reviewed with Dr. Talbert Nan, call returned to patients daughter. OV scheduled for today at 12:45pm. Patient declines interpretor, daughter will accompany patient to her OV. Covid19 precautions review, prescreen negative.   Routing to provider for final review. Patient is agreeable to disposition. Will close encounter.

## 2019-02-24 NOTE — Telephone Encounter (Signed)
Patient's daughter Hart Rochester (on dpr) calling to schedule appointment because it is burning when she urinate.

## 2019-02-25 LAB — URINALYSIS, MICROSCOPIC ONLY: Casts: NONE SEEN /lpf

## 2019-02-25 LAB — VAGINITIS/VAGINOSIS, DNA PROBE
Candida Species: NEGATIVE
Gardnerella vaginalis: NEGATIVE
Trichomonas vaginosis: NEGATIVE

## 2019-02-25 LAB — URINE CULTURE

## 2019-02-26 ENCOUNTER — Other Ambulatory Visit: Payer: Self-pay

## 2019-02-26 ENCOUNTER — Encounter (HOSPITAL_COMMUNITY): Payer: Self-pay

## 2019-02-26 ENCOUNTER — Ambulatory Visit (HOSPITAL_COMMUNITY)
Admission: EM | Admit: 2019-02-26 | Discharge: 2019-02-26 | Disposition: A | Discharge status: Home / Self Care | Payer: BLUE CROSS/BLUE SHIELD | Attending: Internal Medicine | Admitting: Internal Medicine

## 2019-02-26 DIAGNOSIS — R531 Weakness: Secondary | ICD-10-CM

## 2019-02-26 DIAGNOSIS — G8929 Other chronic pain: Secondary | ICD-10-CM | POA: Insufficient documentation

## 2019-02-26 DIAGNOSIS — R112 Nausea with vomiting, unspecified: Secondary | ICD-10-CM | POA: Diagnosis not present

## 2019-02-26 DIAGNOSIS — G44209 Tension-type headache, unspecified, not intractable: Secondary | ICD-10-CM | POA: Diagnosis not present

## 2019-02-26 DIAGNOSIS — Z885 Allergy status to narcotic agent status: Secondary | ICD-10-CM | POA: Insufficient documentation

## 2019-02-26 DIAGNOSIS — Z8249 Family history of ischemic heart disease and other diseases of the circulatory system: Secondary | ICD-10-CM | POA: Diagnosis not present

## 2019-02-26 DIAGNOSIS — R6889 Other general symptoms and signs: Secondary | ICD-10-CM | POA: Diagnosis not present

## 2019-02-26 DIAGNOSIS — I10 Essential (primary) hypertension: Secondary | ICD-10-CM | POA: Diagnosis not present

## 2019-02-26 DIAGNOSIS — Z20822 Contact with and (suspected) exposure to covid-19: Secondary | ICD-10-CM

## 2019-02-26 DIAGNOSIS — Z79899 Other long term (current) drug therapy: Secondary | ICD-10-CM | POA: Diagnosis not present

## 2019-02-26 DIAGNOSIS — Z20828 Contact with and (suspected) exposure to other viral communicable diseases: Secondary | ICD-10-CM | POA: Diagnosis not present

## 2019-02-26 MED ORDER — CYCLOBENZAPRINE HCL 5 MG PO TABS: 5.0000 mg | ORAL_TABLET | Freq: Two times a day (BID) | ORAL | 0 refills | Status: AC | PRN

## 2019-02-26 MED ORDER — ONDANSETRON HCL 4 MG PO TABS: 4.0000 mg | ORAL_TABLET | Freq: Four times a day (QID) | ORAL | 0 refills | Status: AC

## 2019-02-26 NOTE — ED Provider Notes (Signed)
[facility]    CSN: [id number] Arrival date & time: 02/26/19  0857     History   Chief Complaint Chief Complaint  Patient presents with  . Headache  . Emesis    HPI Becky Goodwin is a 53 y.o. female with history of chronic neck pain, hypertension, subclinical hypothyroidism presenting for headache, vomiting, weakness.  Patient is accompanied by her husband who provides translation.  Patient states that she developed a headache from the back of her neck up through the back of her head yesterday evening.  Patient describes headache dull, tight.  Denies worst headache of life, change in vision, thunderclap headache, facial droop/slurred speech.  Patient has had general weakness as well for about a week.  Denies known sick contacts.  No cough, shortness of breath.  Patient endorses intermittent nausea, without known exacerbating or/alleviators.  Patient is tolerated Reglan well in the past for this, though does not have any other medication.  Patient endorsing 2 episodes of emesis that was without blood or bile this morning.  Patient states headache is not as bad, though still there.  Patient tried ibuprofen for headache last night and this morning which improved it some.  Patient denies abdominal pain, diarrhea, urinary symptoms.    Past Medical History:  Diagnosis Date  . Chronic neck pain   . Hyperlipidemia   . Hypertension   . Shoulder pain   . Subclinical hyperthyroidism 05/07/2018    Patient Active Problem List   Diagnosis Date Noted  . Headache 09/30/2018  . Anemia 08/14/2018  . Bruising 08/14/2018  . Cold sore 08/14/2018  . Sore throat 08/14/2018  . Dysuria 06/10/2018  . Headache syndrome 06/10/2018  . Illiteracy 06/10/2018  . Recent major surgery 05/12/2018  . Subclinical hyperthyroidism 05/07/2018  . Muscle spasm 03/06/2018  . Chronic neck pain 03/06/2018  . Chronic upper back pain 03/06/2018  . Palpitation 03/06/2018  . Abnormal TSH 01/08/2018  . Female  bladder prolapse 06/18/2017  . Dizziness 01/13/2017  . Hyperlipidemia 01/13/2017  . Weight gain 12/13/2016  . Edema 12/13/2016  . Pelvic pain in female 10/27/2015  . Chronic right shoulder pain 10/27/2015  . Encounter for health maintenance examination in adult 10/27/2015  . Special screening for malignant neoplasms, colon 10/27/2015  . Screening for breast cancer 10/27/2015    Past Surgical History:  Procedure Laterality Date  . BILATERAL SALPINGECTOMY Bilateral 05/12/2018   Procedure: BILATERAL SALPINGECTOMY;  Surgeon: Romualdo BolkJertson, Jill Evelyn, MD;  [facility] [place];  Service: Gynecology;  Laterality: Bilateral;  . BLADDER SUSPENSION N/A 05/12/2018   Procedure: TRANSVAGINAL TAPE (TVT) PROCEDURE;  Surgeon: Romualdo BolkJertson, Jill Evelyn, MD;  [facility] [place];  Service: Gynecology;  Laterality: N/A;  . BREAST LUMPECTOMY Right 2010  . CYST EXCISION     upper chest/ breast (Dominicaepal), 1 week hospitalization  . CYSTOCELE REPAIR N/A 05/12/2018   Procedure: ANTERIOR REPAIR (CYSTOCELE) with uterosacral ligament suspension;  Surgeon: Romualdo BolkJertson, Jill Evelyn, MD;  [facility] [place];  Service: Gynecology;  Laterality: N/A;  . CYSTOSCOPY N/A 05/12/2018   Procedure: CYSTOSCOPY;  Surgeon: Romualdo BolkJertson, Jill Evelyn, MD;  [facility] [place];  Service: Gynecology;  Laterality: N/A;  . JEJUNOSTOMY FEEDING TUBE     prior with surgery  . LAPAROSCOPIC VAGINAL HYSTERECTOMY WITH SALPINGECTOMY Bilateral 05/12/2018   Procedure: LAPAROSCOPIC ASSISTED VAGINAL HYSTERECTOMY WITH BILATERAL  SALPINGOOPHRECTOMY AND EXTENSIVE LYSIS OF ADHESIONS;  Surgeon: Romualdo BolkJertson, Jill Evelyn, MD;  [facility] [place];  Service: Gynecology;  Laterality: Bilateral;  . TONSILLECTOMY     Right tonsillectomy, 2wk hospitalization ([place]ominicaepal)  . VAGINAL HYSTERECTOMY N/A 05/12/2018   Procedure: ATTEMPTED HYSTERECTOMY VAGINAL;  Surgeon: Romualdo BolkJertson, Jill Evelyn, MD;  [facility]  [place];  Service: Gynecology;  Laterality: N/A;  TVH/BS/anterior repair/ uterosacral ligament suspension/TVT/ cysto/ 3 hours/ extended recovery bed    OB History    Gravida  3   Para  3   Term  3   Preterm      AB      Living  3     SAB      TAB      Ectopic      Multiple      Live Births  3            Home Medications    Prior to Admission medications   Medication Sig Start Date End Date Taking? Authorizing Provider  betamethasone valerate ointment (VALISONE) 0.1 % Use a pea sized amount topically BID for 1-2 weeks as needed. 02/24/19   Romualdo BolkJertson, Jill Evelyn, MD  conjugated estrogens (PREMARIN) vaginal cream Use 1/2 a gram nightly for one week, then change to 1/2 gram vaginally twice weekly 02/24/19   Romualdo BolkJertson, Jill Evelyn, MD  cyclobenzaprine (FLEXERIL) 5 MG tablet Take 1 tablet (5 mg total) by mouth 2 (two) times daily as needed for muscle spasms. 02/26/19   Hall-Potvin, [place], PA-C  Docusate Calcium (STOOL SOFTENER PO) Take by mouth.    [provider]  ondansetron (ZOFRAN) 4 MG tablet Take 1 tablet (4 mg total) by mouth every 6 (six) hours. 02/26/19   Hall-Potvin, [place], PA-C    Family History Family History  Problem Relation Age of Onset  . Healthy Mother   . Hypertension Father   . Cancer Neg Hx   . Diabetes Neg Hx   . Heart disease Neg Hx   . Stroke Neg Hx   . Thyroid disease Neg Hx     Social History Social History   Tobacco Use  . Smoking status: Never Smoker  . Smokeless tobacco: Never Used  Substance Use Topics  . Alcohol use: No    Alcohol/week: 0.0 standard drinks  . Drug use: No     Allergies   Hydrocodone   Review of Systems As per HPI   Physical Exam Triage Vital Signs ED Triage Vitals [02/26/19 0913]  Enc Vitals Group     BP (!) 138/97     Pulse Rate 65     Resp 17     Temp 97.7 F (36.5 C)     Temp Source Oral     SpO2 100 %     Weight      Height      Head Circumference      Peak Flow       Pain Score      Pain Loc      Pain Edu?      Excl. in GC?    No data found.  Updated Vital Signs BP (!) 138/97 (BP Location: Left Arm)   Pulse 65   Temp 97.7 F (36.5 C) (Oral)   Resp 17   LMP 10/06/2012   SpO2 100%   Visual Acuity Right Eye Distance:   Left Eye Distance:   Bilateral Distance:    Right Eye Near:   Left Eye Near:    Bilateral Near:     Physical Exam Constitutional:      General: She is not in  acute distress.    Appearance: She is normal weight. She is not toxic-appearing.  HENT:     Head: Normocephalic and atraumatic.     Mouth/Throat:     Mouth: Mucous membranes are moist.     Pharynx: Oropharynx is clear.  Eyes:     General: No scleral icterus.    Extraocular Movements: Extraocular movements intact.     Pupils: Pupils are equal, round, and reactive to light.  Neck:     Musculoskeletal: Normal range of motion and neck supple.  Cardiovascular:     Rate and Rhythm: Normal rate and regular rhythm.     Heart sounds: Normal heart sounds.  Pulmonary:     Effort: Pulmonary effort is normal. No respiratory distress.     Breath sounds: No wheezing or rhonchi.  Abdominal:     General: There is no distension.     Palpations: Abdomen is soft.     Tenderness: There is no abdominal tenderness. There is no guarding.  Musculoskeletal: Normal range of motion.  Lymphadenopathy:     Cervical: No cervical adenopathy.  Skin:    General: Skin is warm.     Capillary Refill: Capillary refill takes less than 2 seconds.     Coloration: Skin is not cyanotic, jaundiced or pale.  Neurological:     Mental Status: She is alert and oriented to person, place, and time.     Cranial Nerves: No cranial nerve deficit, dysarthria or facial asymmetry.     Sensory: No sensory deficit.     Motor: No weakness.     Coordination: Coordination normal.     Gait: Gait normal.     Deep Tendon Reflexes: Reflexes normal.  Psychiatric:        Mood and Affect: Mood is not anxious.         Speech: Speech normal.        Behavior: Behavior is not agitated.      UC Treatments / Results  Labs (all labs ordered are listed, but only abnormal results are displayed) Labs Reviewed  NOVEL CORONAVIRUS, NAA (HOSPITAL ORDER, SEND-OUT TO REF LAB)    EKG   Radiology No results found.  Procedures Procedures (including critical care time)  Medications Ordered in UC Medications - No data to display  Initial Impression / Assessment and Plan / UC Course  I have reviewed the triage vital signs and the nursing notes.  Pertinent labs & imaging results that were available during my care of the patient were reviewed by me and considered in my medical decision making (see chart for details).     53 year old female accompanied by her husband for acute concern of upper back/neck pain, headache, vomiting.  Patient denies history of stroke or heart attack.  No neurocognitive deficits on exam today.  Patient is hemodynamically stable.  Low concern for dehydration second to emesis.  Likely viral process in conjunction with tension headache second to chronic neck pain.  Prescribe Zofran for nausea, advised patient to take ibuprofen, Tylenol for headache in conjunction with muscle relaxer, heating pad/hot towel.  COVID swab done in office, which patient tolerated well: We will call with results as soon as available.  Patient to isolate until then.  Return precautions discussed, patient and husband verbalized understanding and are agreeable to plan. Final Clinical Impressions(s) / UC Diagnoses   Final diagnoses:  Suspected Covid-19 Virus Infection  Tension headache  Non-intractable vomiting with nausea, unspecified vomiting type     Discharge Instructions  Take Zofran as needed for nausea. Use muscle relaxer as needed for severe back pain/tension headache. May continue OTC analgesics for headache. Important to drink plenty of water throughout the day to stay hydrated. We will  call you with the results of your COVID test: It is important he stay home until then. Return if you develop fever, worsening muscle aches, weakness, cough, shortness of breath.    ED Prescriptions    Medication Sig Dispense Auth. Provider   ondansetron (ZOFRAN) 4 MG tablet Take 1 tablet (4 mg total) by mouth every 6 (six) hours. 12 tablet Hall-Potvin, [place], PA-C   cyclobenzaprine (FLEXERIL) 5 MG tablet Take 1 tablet (5 mg total) by mouth 2 (two) times daily as needed for muscle spasms. 14 tablet Hall-Potvin, [place], PA-C     Controlled Substance Prescriptions [place] Controlled Substance Registry consulted? Not Applicable   Shea EvansHall-Potvin, , [place] JerseyPA-C 02/26/19 [id number]

## 2019-02-26 NOTE — ED Triage Notes (Signed)
Patient presents to Urgent Care with complaints of headache since last night, followed by two episodes of vomiting this morning. Patient reports she tried taking headache medicine today but it has not helped much. No neuro deficits noted at this time, VSS.

## 2019-02-26 NOTE — Discharge Instructions (Addendum)
Take Zofran as needed for nausea. Use muscle relaxer as needed for severe back pain/tension headache. May continue OTC analgesics for headache. Important to drink plenty of water throughout the day to stay hydrated. We will call you with the results of your COVID test: It is important he stay home until then. Return if you develop fever, worsening muscle aches, weakness, cough, shortness of breath.

## 2019-02-27 LAB — NOVEL CORONAVIRUS, NAA (HOSP ORDER, SEND-OUT TO REF LAB; TAT 18-24 HRS): SARS-CoV-2, NAA: NOT DETECTED

## 2019-03-02 ENCOUNTER — Other Ambulatory Visit: Payer: Self-pay

## 2019-03-02 DIAGNOSIS — Z20822 Contact with and (suspected) exposure to covid-19: Secondary | ICD-10-CM

## 2019-03-02 DIAGNOSIS — Z20828 Contact with and (suspected) exposure to other viral communicable diseases: Secondary | ICD-10-CM

## 2019-03-04 LAB — NOVEL CORONAVIRUS, NAA: SARS-CoV-2, NAA: NOT DETECTED

## 2019-03-11 DIAGNOSIS — Z0000 Encounter for general adult medical examination without abnormal findings: Secondary | ICD-10-CM | POA: Diagnosis not present

## 2019-03-11 DIAGNOSIS — Z90710 Acquired absence of both cervix and uterus: Secondary | ICD-10-CM | POA: Diagnosis not present

## 2019-03-11 DIAGNOSIS — Z1211 Encounter for screening for malignant neoplasm of colon: Secondary | ICD-10-CM | POA: Diagnosis not present

## 2019-03-11 DIAGNOSIS — Z713 Dietary counseling and surveillance: Secondary | ICD-10-CM | POA: Diagnosis not present

## 2019-03-11 DIAGNOSIS — Z8249 Family history of ischemic heart disease and other diseases of the circulatory system: Secondary | ICD-10-CM | POA: Diagnosis not present

## 2019-03-11 DIAGNOSIS — Z1329 Encounter for screening for other suspected endocrine disorder: Secondary | ICD-10-CM | POA: Diagnosis not present

## 2019-03-11 DIAGNOSIS — Z1239 Encounter for other screening for malignant neoplasm of breast: Secondary | ICD-10-CM | POA: Diagnosis not present

## 2019-04-20 ENCOUNTER — Telehealth: Payer: Self-pay | Admitting: Obstetrics and Gynecology

## 2019-04-20 NOTE — Telephone Encounter (Signed)
Patient's husband states Dawnya needs refill on vaginal cream. Unsure of the name of medication. Pharmacy on file.

## 2019-04-20 NOTE — Telephone Encounter (Signed)
Patient has a refill per pharmacist. Pharmacy will get it ready. Tried calling patient, no answer, unable to leave voicemail.

## 2019-05-04 DIAGNOSIS — G44209 Tension-type headache, unspecified, not intractable: Secondary | ICD-10-CM | POA: Diagnosis not present

## 2019-05-04 DIAGNOSIS — S46812A Strain of other muscles, fascia and tendons at shoulder and upper arm level, left arm, initial encounter: Secondary | ICD-10-CM | POA: Diagnosis not present

## 2019-06-28 ENCOUNTER — Other Ambulatory Visit: Payer: Self-pay

## 2019-06-28 ENCOUNTER — Emergency Department (HOSPITAL_BASED_OUTPATIENT_CLINIC_OR_DEPARTMENT_OTHER)
Admission: EM | Admit: 2019-06-28 | Discharge: 2019-06-28 | Disposition: A | Discharge status: Home / Self Care | Payer: BLUE CROSS/BLUE SHIELD | Attending: Emergency Medicine | Admitting: Emergency Medicine

## 2019-06-28 ENCOUNTER — Encounter (HOSPITAL_BASED_OUTPATIENT_CLINIC_OR_DEPARTMENT_OTHER): Payer: Self-pay | Admitting: *Deleted

## 2019-06-28 ENCOUNTER — Emergency Department (HOSPITAL_BASED_OUTPATIENT_CLINIC_OR_DEPARTMENT_OTHER): Payer: BLUE CROSS/BLUE SHIELD

## 2019-06-28 DIAGNOSIS — W268XXA Contact with other sharp object(s), not elsewhere classified, initial encounter: Secondary | ICD-10-CM | POA: Diagnosis not present

## 2019-06-28 DIAGNOSIS — Y9389 Activity, other specified: Secondary | ICD-10-CM | POA: Insufficient documentation

## 2019-06-28 DIAGNOSIS — S61011A Laceration without foreign body of right thumb without damage to nail, initial encounter: Secondary | ICD-10-CM | POA: Insufficient documentation

## 2019-06-28 DIAGNOSIS — Y999 Unspecified external cause status: Secondary | ICD-10-CM | POA: Insufficient documentation

## 2019-06-28 DIAGNOSIS — E785 Hyperlipidemia, unspecified: Secondary | ICD-10-CM | POA: Diagnosis not present

## 2019-06-28 DIAGNOSIS — I10 Essential (primary) hypertension: Secondary | ICD-10-CM | POA: Diagnosis not present

## 2019-06-28 DIAGNOSIS — Y92000 Kitchen of unspecified non-institutional (private) residence as  the place of occurrence of the external cause: Secondary | ICD-10-CM | POA: Insufficient documentation

## 2019-06-28 DIAGNOSIS — Z23 Encounter for immunization: Secondary | ICD-10-CM | POA: Diagnosis not present

## 2019-06-28 MED ORDER — TETANUS-DIPHTH-ACELL PERTUSSIS 5-2.5-18.5 LF-MCG/0.5 IM SUSP
0.5000 mL | Freq: Once | INTRAMUSCULAR | Status: AC
  Administered 2019-06-28: 0.5 mL via INTRAMUSCULAR
  Filled 2019-06-28: qty 0.5

## 2019-06-28 MED ORDER — LIDOCAINE HCL 2 % IJ SOLN
20.0000 mL | Freq: Once | INTRAMUSCULAR | Status: AC
  Administered 2019-06-28: 400 mg
  Filled 2019-06-28: qty 20

## 2019-06-28 MED ORDER — BACITRACIN ZINC 500 UNIT/GM EX OINT
TOPICAL_OINTMENT | Freq: Once | CUTANEOUS | Status: AC
  Administered 2019-06-28: 1 via TOPICAL

## 2019-06-28 NOTE — ED Notes (Signed)
Pt. Son at bedside translating for her

## 2019-06-28 NOTE — Discharge Instructions (Addendum)
You were evaluated in the Emergency Department and after careful evaluation, we did not find any emergent condition requiring admission or further testing in the hospital.  Your exam/testing today is overall reassuring.  The laceration on your finger was repaired here in the emergency department.  As discussed, your sutures will need to be removed in 5 to 7 days by healthcare professional.  Please keep the wound clean and dry and avoid excessive use.  Use Tylenol or Motrin at home for discomfort.  Please return to the Emergency Department if you experience any worsening of your condition.  We encourage you to follow up with a primary care provider.  Thank you for allowing [place] to be a part of your care.

## 2019-06-28 NOTE — ED Provider Notes (Signed)
[facility] Emergency Department Provider Note MRN:  [id number]  Arrival date & time: 06/28/19     Chief Complaint   Laceration   History of Present Illness   Becky Goodwin is a 53 y.o. year-old female with a history of hypertension presenting to the ED with chief complaint of laceration.  Laceration to the right thumb, cut it on a open can of prunes.  Denies any other injuries.  Pain is moderate in severity, worse with motion or palpation.  Unsure of last tetanus shot.  Review of Systems  A complete 10 system review of systems was obtained and all systems are negative except as noted in the HPI and PMH.   Patient's Health History    Past Medical History:  Diagnosis Date  . Chronic neck pain   . Hyperlipidemia   . Hypertension   . Shoulder pain   . Subclinical hyperthyroidism 05/07/2018    Past Surgical History:  Procedure Laterality Date  . BILATERAL SALPINGECTOMY Bilateral 05/12/2018   Procedure: BILATERAL SALPINGECTOMY;  Surgeon: Romualdo BolkJertson, Jill Evelyn, MD;  [facility] [place];  Service: Gynecology;  Laterality: Bilateral;  . BLADDER SUSPENSION N/A 05/12/2018   Procedure: TRANSVAGINAL TAPE (TVT) PROCEDURE;  Surgeon: Romualdo BolkJertson, Jill Evelyn, MD;  [facility] [place];  Service: Gynecology;  Laterality: N/A;  . BREAST LUMPECTOMY Right 2010  . CYST EXCISION     upper chest/ breast ([place]ominicaepal), 1 week hospitalization  . CYSTOCELE REPAIR N/A 05/12/2018   Procedure: ANTERIOR REPAIR (CYSTOCELE) with uterosacral ligament suspension;  Surgeon: Romualdo BolkJertson, Jill Evelyn, MD;  [facility] [place];  Service: Gynecology;  Laterality: N/A;  . CYSTOSCOPY N/A 05/12/2018   Procedure: CYSTOSCOPY;  Surgeon: Romualdo BolkJertson, Jill Evelyn, MD;  [facility] [place];  Service: Gynecology;  Laterality: N/A;  . JEJUNOSTOMY FEEDING TUBE     prior with surgery  . LAPAROSCOPIC VAGINAL HYSTERECTOMY WITH SALPINGECTOMY Bilateral  05/12/2018   Procedure: LAPAROSCOPIC ASSISTED VAGINAL HYSTERECTOMY WITH BILATERAL  SALPINGOOPHRECTOMY AND EXTENSIVE LYSIS OF ADHESIONS;  Surgeon: Romualdo BolkJertson, Jill Evelyn, MD;  [facility] [place];  Service: Gynecology;  Laterality: Bilateral;  . TONSILLECTOMY     Right tonsillectomy, 2wk hospitalization (Dominicaepal)  . VAGINAL HYSTERECTOMY N/A 05/12/2018   Procedure: ATTEMPTED HYSTERECTOMY VAGINAL;  Surgeon: Romualdo BolkJertson, Jill Evelyn, MD;  [facility] ;  Service: Gynecology;  Laterality: N/A;  TVH/BS/anterior repair/ uterosacral ligament suspension/TVT/ cysto/ 3 hours/ extended recovery bed    Family History  Problem Relation Age of Onset  . Healthy Mother   . Hypertension Father   . Cancer Neg Hx   . Diabetes Neg Hx   . Heart disease Neg Hx   . Stroke Neg Hx   . Thyroid disease Neg Hx     Social History   Socioeconomic History  . Marital status: Married    Spouse name: Not on file  . Number of children: Not on file  . Years of education: Not on file  . Highest education level: Not on file  Occupational History  . Not on file  Social Needs  . Financial resource strain: Not on file  . Food insecurity    Worry: Not on file    Inability: Not on file  . Transportation needs    Medical: Not on file    Non-medical: Not on file  Tobacco Use  . Smoking status: Never Smoker  . Smokeless tobacco: Never Used  Substance and Sexual Activity  . Alcohol use: No  Alcohol/week: 0.0 standard drinks  . Drug use: No  . Sexual activity: Not Currently    Partners: Male    Birth control/protection: None, Post-menopausal  Lifestyle  . Physical activity    Days per week: Not on file    Minutes per session: Not on file  . Stress: Not on file  Relationships  . Social Musician on phone: Not on file    Gets together: Not on file    Attends religious service: Not on file    Active member of club or organization: Not on file    Attends meetings of  clubs or organizations: Not on file    Relationship status: Not on file  . Intimate partner violence    Fear of current or ex partner: Not on file    Emotionally abused: Not on file    Physically abused: Not on file    Forced sexual activity: Not on file  Other Topics Concern  . Not on file  Social History Narrative   Lives with husband, 3 children, ages 12yo, 73yo, 64yo, works in Designer, fashion/clothing, various jobs, some exercise with walking     Physical Exam  Vital Signs and Nursing Notes reviewed Vitals:   06/28/19 1418  BP: (!) 133/100  Pulse: 72  Resp: 18  Temp: 97.7 F (36.5 C)  SpO2: 99%    CONSTITUTIONAL: Well-appearing, NAD NEURO:  Alert and oriented x 3, no focal deficits EYES:  eyes equal and reactive ENT/NECK:  no LAD, no JVD CARDIO: Regular rate, well-perfused, normal S1 and S2 PULM:  CTAB no wheezing or rhonchi GI/GU:  normal bowel sounds, non-distended, non-tender MSK/SPINE:  No gross deformities, no edema SKIN: 2 cm laceration to the pad of the right distal thumb PSYCH:  Appropriate speech and behavior  Diagnostic and Interventional Summary    EKG Interpretation  Date/Time:    Ventricular Rate:    PR Interval:    QRS Duration:   QT Interval:    QTC Calculation:   R Axis:     Text Interpretation:        Labs Reviewed - No data to display  DG Finger Thumb Right  Final Result      Medications  bacitracin ointment (has no administration in time range)  lidocaine (XYLOCAINE) 2 % (with pres) injection 400 mg (400 mg Other Given 06/28/19 1534)  Tdap (BOOSTRIX) injection 0.5 mL (0.5 mLs Intramuscular Given 06/28/19 1534)     Procedures  /  Critical Care .Nerve Block  Date/Time: 06/28/2019 4:11 PM Performed by: Sabas Sous, MD Authorized by: Sabas Sous, MD   Consent:    Consent obtained:  Verbal   Consent given by:  Patient   Risks discussed:  Nerve damage, swelling, unsuccessful block and pain Indications:    Indications:  Procedural  anesthesia Location:    Nerve block body site: Right thumb. Pre-procedure details:    Skin preparation:  Alcohol   Preparation: Patient was prepped and draped in usual sterile fashion   Procedure details (see MAR for exact dosages):    Block needle gauge:  25 G   Anesthetic injected:  Lidocaine 2% w/o epi   Injection procedure:  Anatomic landmarks identified, anatomic landmarks palpated, incremental injection and negative aspiration for blood   Paresthesia:  None Post-procedure details:    Dressing:  None   Outcome:  Anesthesia achieved   Patient tolerance of procedure:  Tolerated well, no immediate complications .Marland KitchenLaceration Repair  Date/Time: 06/28/2019 4:12  PM Performed by: Maudie Flakes, MD Authorized by: Maudie Flakes, MD   Consent:    Consent obtained:  Verbal   Consent given by:  Patient   Risks discussed:  Infection, need for additional repair, nerve damage, pain, poor cosmetic result and poor wound healing Anesthesia (see MAR for exact dosages):    Anesthesia method:  Nerve block Laceration details:    Location:  Finger   Finger location:  R thumb   Length (cm):  3   Depth (mm):  3 Repair type:    Repair type:  Intermediate Pre-procedure details:    Preparation:  Patient was prepped and draped in usual sterile fashion Exploration:    Hemostasis achieved with:  Tourniquet   Wound exploration: wound explored through full range of motion and entire depth of wound probed and visualized     Contaminated: no   Treatment:    Area cleansed with:  Saline   Amount of cleaning:  Standard Skin repair:    Repair method:  Sutures   Suture size:  4-0   Suture material:  Prolene   Suture technique:  Simple interrupted   Number of sutures:  11 Approximation:    Approximation:  Close Post-procedure details:    Dressing:  Antibiotic ointment   Patient tolerance of procedure:  Tolerated well, no immediate complications Comments:     Undermining and debridement was  required for wound closure    ED Course and Medical Decision Making  I have reviewed the triage vital signs and the nursing notes.  Pertinent labs & imaging results that were available during my care of the patient were reviewed by me and considered in my medical decision making (see below for details).     X-ray to exclude open fracture or foreign body, will need digital block and suture repair.  No other injuries.  X-ray reassuring, laceration repaired as described above, appropriate for discharge.  Barth Kirks. [place] Small, [place] [email or web address]  Final Clinical Impressions(s) / ED Diagnoses     ICD-10-CM   1. Laceration of right thumb without foreign body without damage to nail, initial encounter  S61.011A     ED Discharge Orders    None       Discharge Instructions Discussed with and Provided to Patient:     Discharge Instructions     You were evaluated in the Emergency Department and after careful evaluation, we did not find any emergent condition requiring admission or further testing in the hospital.  Your exam/testing today is overall reassuring.  The laceration on your finger was repaired here in the emergency department.  As discussed, your sutures will need to be removed in 5 to 7 days by healthcare professional.  Please keep the wound clean and dry and avoid excessive use.  Use Tylenol or Motrin at home for discomfort.  Please return to the Emergency Department if you experience any worsening of your condition.  We encourage you to follow up with a primary care provider.  Thank you for allowing [place] to be a part of your care.       Maudie Flakes, MD 06/28/19 [phone]

## 2019-06-28 NOTE — ED Triage Notes (Signed)
Right thumb laceration on the lid of a can while opening a can.

## 2019-06-30 ENCOUNTER — Other Ambulatory Visit: Payer: Self-pay | Admitting: Obstetrics and Gynecology

## 2019-06-30 DIAGNOSIS — N760 Acute vaginitis: Secondary | ICD-10-CM

## 2019-06-30 DIAGNOSIS — R300 Dysuria: Secondary | ICD-10-CM

## 2019-06-30 MED ORDER — PREMARIN 0.625 MG/GM VA CREA: TOPICAL_CREAM | VAGINAL | 0 refills | Status: AC

## 2019-06-30 MED ORDER — BETAMETHASONE VALERATE 0.1 % EX OINT: TOPICAL_OINTMENT | CUTANEOUS | 0 refills | Status: AC

## 2019-06-30 NOTE — Telephone Encounter (Signed)
Patient's husband came into the office requesting a refill for the patient's premarin and betamethasone. Patient's husband presented with the empty medication tubes requesting them to be refilled. Patient's husband requested that the prescriptions be sent to [facility] at [address] in [place]. Pharmacy updated in chart. Patient's husband left his phone number to contact at [phone].

## 2019-06-30 NOTE — Telephone Encounter (Signed)
Spoke to pts husband per DPR. Pts husband came to office for refills for wife's medications for Premarin and Betamethasone. Husband states out of meds. Would like refill on valisone and premarin  Will route to Dr Talbert Nan, Rx meds pended.

## 2019-06-30 NOTE — Telephone Encounter (Signed)
Her premarin cream had a refill on it. She shouldn't be through with 2 tubes since 7/20. Please check if she has gotten the refill? I will send in one tube. She should only be using 0.5 grams 2 x a week at hs. You can speak with her daughter Gaylene Brooks [phone] (okay on DPR, she comes to her appointments with her)

## 2019-07-01 NOTE — Telephone Encounter (Signed)
Spoke with pts husband per DPR. Updated on usage of cream and the Rx were sent to [facility] per Dr Talbert Nan on 06/30/19, verbalized understanding.  Routing to provider for final review. Patient is agreeable to disposition. Will close encounter.

## 2019-07-05 DIAGNOSIS — S61011D Laceration without foreign body of right thumb without damage to nail, subsequent encounter: Secondary | ICD-10-CM | POA: Diagnosis not present

## 2019-07-05 DIAGNOSIS — Z4802 Encounter for removal of sutures: Secondary | ICD-10-CM | POA: Diagnosis not present

## 2019-07-16 DIAGNOSIS — Z1231 Encounter for screening mammogram for malignant neoplasm of breast: Secondary | ICD-10-CM | POA: Diagnosis not present

## 2019-07-16 DIAGNOSIS — Z0000 Encounter for general adult medical examination without abnormal findings: Secondary | ICD-10-CM | POA: Diagnosis not present

## 2019-07-16 DIAGNOSIS — Z1239 Encounter for other screening for malignant neoplasm of breast: Secondary | ICD-10-CM | POA: Diagnosis not present

## 2019-07-20 ENCOUNTER — Telehealth: Payer: Self-pay | Admitting: Medical

## 2019-07-20 ENCOUNTER — Other Ambulatory Visit: Payer: Self-pay | Admitting: Medical

## 2019-07-20 DIAGNOSIS — M25511 Pain in right shoulder: Secondary | ICD-10-CM

## 2019-07-20 DIAGNOSIS — G8929 Other chronic pain: Secondary | ICD-10-CM

## 2019-07-20 NOTE — Progress Notes (Signed)
Husband called in about ongoing shoulder problems, wanted referral. They are living in [place], [place] now.  Refer to Dr Judeth Cornfield if insurance allows.

## 2019-07-20 NOTE — Telephone Encounter (Signed)
Husband called in about ongoing shoulder problems, wanted referral. They are living in [place], [place] now.  Refer to Dr Ken Lennon if insurance allows.   

## 2019-07-21 NOTE — Telephone Encounter (Signed)
Referral has been sent.

## 2019-12-15 DIAGNOSIS — Z0289 Encounter for other administrative examinations: Secondary | ICD-10-CM

## 2021-03-20 IMAGING — DX DG FINGER THUMB 2+V*R*
3 series · 3 of 3 positions shown · non-contrast
Comparison: None.

CLINICAL DATA: Laceration with aluminum can

EXAM:
RIGHT THUMB 2+V

[finger ap]
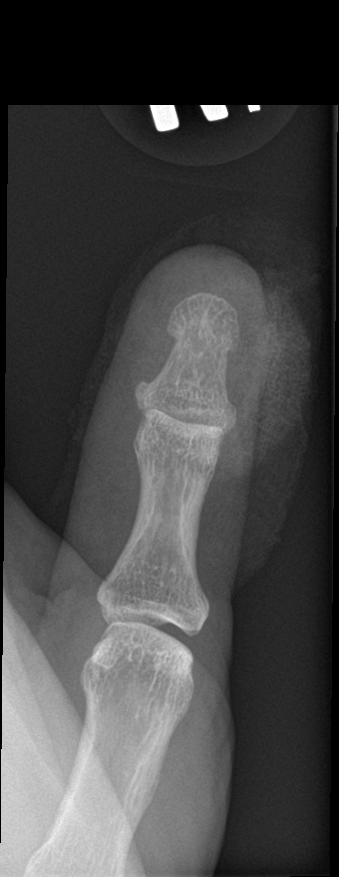

[finger obl]
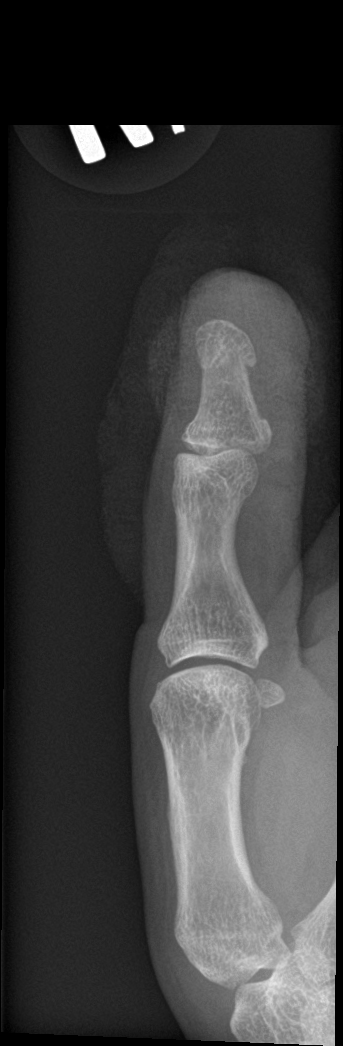

[finger lat]
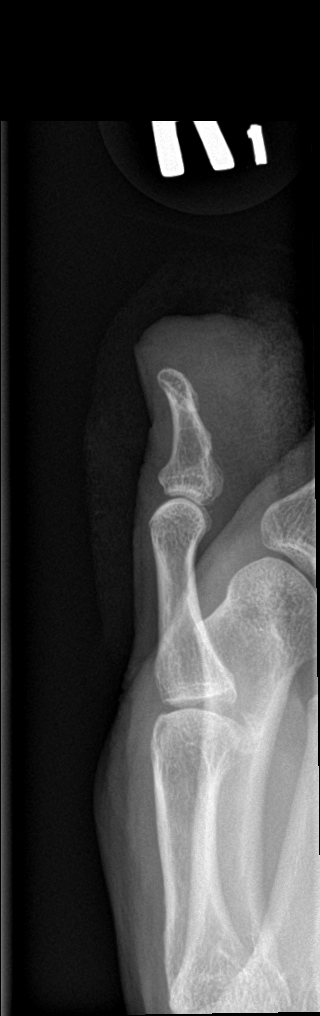

[3 of 3 positions shown; findings below may reference images not displayed]

FINDINGS: Frontal, oblique, and lateral views were obtained. There is an
overlying bandage. Beyond the bandage, there is no radiopaque
foreign body. No demonstrable fracture or dislocation. Joint spaces
appear normal. No erosive change.
IMPRESSION: Overlying bandage. No other radiopaque foreign body. No fracture or
dislocation. No appreciable arthropathy.

## 2022-04-17 ENCOUNTER — Encounter: Payer: Self-pay | Admitting: Internal Medicine

## 2022-05-21 ENCOUNTER — Encounter: Payer: Self-pay | Admitting: Internal Medicine

## 2023-02-14 ENCOUNTER — Other Ambulatory Visit: Payer: Self-pay

## 2023-02-14 ENCOUNTER — Emergency Department (HOSPITAL_BASED_OUTPATIENT_CLINIC_OR_DEPARTMENT_OTHER): Payer: Medicaid Other

## 2023-02-14 ENCOUNTER — Emergency Department (HOSPITAL_BASED_OUTPATIENT_CLINIC_OR_DEPARTMENT_OTHER)
Admission: EM | Admit: 2023-02-14 | Discharge: 2023-02-14 | Disposition: A | Discharge status: Home / Self Care | Payer: Medicaid Other | Attending: Emergency Medicine | Admitting: Emergency Medicine

## 2023-02-14 ENCOUNTER — Encounter (HOSPITAL_BASED_OUTPATIENT_CLINIC_OR_DEPARTMENT_OTHER): Payer: Self-pay

## 2023-02-14 DIAGNOSIS — R42 Dizziness and giddiness: Secondary | ICD-10-CM | POA: Diagnosis present

## 2023-02-14 DIAGNOSIS — I10 Essential (primary) hypertension: Secondary | ICD-10-CM | POA: Diagnosis not present

## 2023-02-14 LAB — CBC WITH DIFFERENTIAL/PLATELET
Abs Immature Granulocytes: 0.01 10*3/uL (ref 0.00–0.07)
Basophils Absolute: 0 10*3/uL (ref 0.0–0.1)
Basophils Relative: 0 %
Eosinophils Absolute: 0.2 10*3/uL (ref 0.0–0.5)
Eosinophils Relative: 4 %
HCT: 39.7 % (ref 36.0–46.0)
Hemoglobin: 12.6 g/dL (ref 12.0–15.0)
Immature Granulocytes: 0 %
Lymphocytes Relative: 42 %
Lymphs Abs: 2.4 10*3/uL (ref 0.7–4.0)
MCH: 28.3 pg (ref 26.0–34.0)
MCHC: 31.7 g/dL (ref 30.0–36.0)
MCV: 89.2 fL (ref 80.0–100.0)
Monocytes Absolute: 0.4 10*3/uL (ref 0.1–1.0)
Monocytes Relative: 7 %
Neutro Abs: 2.6 10*3/uL (ref 1.7–7.7)
Neutrophils Relative %: 47 %
Platelets: 284 10*3/uL (ref 150–400)
RBC: 4.45 MIL/uL (ref 3.87–5.11)
RDW: 13 % (ref 11.5–15.5)
WBC: 5.6 10*3/uL (ref 4.0–10.5)
nRBC: 0 % (ref 0.0–0.2)

## 2023-02-14 LAB — BASIC METABOLIC PANEL
Anion gap: 10 (ref 5–15)
BUN: 11 mg/dL (ref 6–20)
CO2: 24 mmol/L (ref 22–32)
Calcium: 9.1 mg/dL (ref 8.9–10.3)
Chloride: 105 mmol/L (ref 98–111)
Creatinine, Ser: 0.74 mg/dL (ref 0.44–1.00)
GFR, Estimated: >60 mL/min (ref >60)
Glucose, Bld: 90 mg/dL (ref 70–99)
Potassium: 3.8 mmol/L (ref 3.5–5.1)
Sodium: 139 mmol/L (ref 135–145)

## 2023-02-14 MED ORDER — MECLIZINE HCL 25 MG PO TABS: 25.0000 mg | ORAL_TABLET | Freq: Three times a day (TID) | ORAL | 0 refills | Status: AC | PRN

## 2023-02-14 MED ORDER — MECLIZINE HCL 25 MG PO TABS
25.0000 mg | ORAL_TABLET | Freq: Once | ORAL | Status: AC
  Administered 2023-02-14: 25 mg via ORAL
  Filled 2023-02-14: qty 1

## 2023-02-14 NOTE — Discharge Instructions (Signed)
Please read and follow all provided instructions.  Your diagnoses today include:  1. Dizziness    Tests performed today include: Blood cell count and electrolytes: Were normal EKG: was normal CT of the brain: did not show any signs of an acute stroke, although this may miss blood clot type strokes as we discussed Vital signs. See below for your results today.   Medications prescribed:  Meclizine: Medication to try for dizziness  Take any prescribed medications only as directed.  Home care instructions:  Follow any educational materials contained in this packet.  Follow-up instructions: Please follow-up with your primary care provider in the next 3 days for further evaluation of your symptoms.   If your symptoms get worse or you develop any stroke symptoms as below, please return to the emergency department for consideration of MRI.  Return instructions:  Please return to the Emergency Department if you experience worsening symptoms.  Return if you have weakness in your arms or legs, slurred speech, trouble walking or talking, confusion, or trouble with your balance.  Please return if you have any other emergent concerns.  Additional Information:  Your vital signs today were: BP 114/76   Pulse 65   Temp 98 F (36.7 C) (Oral)   Resp 18   Ht 5\' 4"  (1.626 m)   Wt 63 kg   LMP 10/06/2012   SpO2 98%   BMI 23.86 kg/m  If your blood pressure (BP) was elevated above 135/85 this visit, please have this repeated by your doctor within one month. --------------

## 2023-02-14 NOTE — ED Provider Notes (Signed)
[place] EMERGENCY DEPARTMENT AT [facility] Provider Note   CSN: [id number] Arrival date & time: 02/14/23  1537     History  Chief Complaint  Patient presents with   Hypertension    Becky Goodwin is a 57 y.o. female.  Patient presents to the emergency department today for dizziness.  She is accompanied by a family member who, with patient's permission, interprets on behalf of the patient.  Patient has about 2 to 3 days of dizziness.  This is in the setting of hypertension.  She describes the dizziness as an off-balance sensation such as the ground is moving and also has a lightheaded sensation as though she is going to pass out.  She denies vertigo.  She also reports tinnitus.  She also has chronic pain in her right neck and shoulder which is unchanged.  No current chest pain or shortness of breath.  She feels that her lateral visual field in her right eye was a little blurry today, but she denies loss of vision or black in her vision.  No headache.  No nausea, vomiting.  Family noted an elevated blood pressure reading with the diastolic blood pressure being above 100 today.  This prompted call to their primary provider who recommended that they come to the emergency department for evaluation.  This is why they present here today.       Home Medications Prior to Admission medications   Medication Sig Start Date End Date Taking? Authorizing Provider  betamethasone valerate ointment (VALISONE) 0.1 % Use a pea sized amount topically BID for 1-2 weeks as needed. 06/30/19   Romualdo Bolk, MD  conjugated estrogens (PREMARIN) vaginal cream Insert 0.5 grams vaginally twice weekly 06/30/19   Romualdo Bolk, MD  cyclobenzaprine (FLEXERIL) 5 MG tablet Take 1 tablet (5 mg total) by mouth 2 (two) times daily as needed for muscle spasms. 02/26/19   Hall-Potvin, [place], PA-C  Docusate Calcium (STOOL SOFTENER PO) Take by mouth.    [provider]  ondansetron (ZOFRAN) 4  MG tablet Take 1 tablet (4 mg total) by mouth every 6 (six) hours. 02/26/19   Hall-Potvin, [place], PA-C      Allergies    Hydrocodone    Review of Systems   Review of Systems  Physical Exam Updated Vital Signs BP (!) 131/96 (BP Location: Right Arm)   Pulse 77   Temp 98 F (36.7 C) (Oral)   Resp 18   Ht 5\' 4"  (1.626 m)   Wt 63 kg   LMP 10/06/2012   SpO2 99%   BMI 23.86 kg/m   Physical Exam Vitals and nursing note reviewed.  Constitutional:      Appearance: She is well-developed.  HENT:     Head: Normocephalic and atraumatic.     Right Ear: Tympanic membrane, ear canal and external ear normal.     Left Ear: Tympanic membrane, ear canal and external ear normal.     Nose: Nose normal.     Mouth/Throat:     Pharynx: Uvula midline.  Eyes:     General: Lids are normal. No visual field deficit.    Extraocular Movements:     Right eye: No nystagmus.     Left eye: No nystagmus.     Conjunctiva/sclera: Conjunctivae normal.     Pupils: Pupils are equal, round, and reactive to light.  Neck:     Vascular: No carotid bruit.  Cardiovascular:     Rate and Rhythm: Normal rate and regular  rhythm.  Pulmonary:     Effort: Pulmonary effort is normal.     Breath sounds: Normal breath sounds.  Abdominal:     Palpations: Abdomen is soft.     Tenderness: There is no abdominal tenderness.  Musculoskeletal:     Cervical back: Normal range of motion and neck supple. No tenderness or bony tenderness.     Comments: Right neck and shoulder tenderness.  Skin:    General: Skin is warm and dry.  Neurological:     Mental Status: She is alert and oriented to person, place, and time.     GCS: GCS eye subscore is 4. GCS verbal subscore is 5. GCS motor subscore is 6.     Cranial Nerves: No cranial nerve deficit.     Sensory: No sensory deficit.     Motor: No weakness.     Coordination: Romberg sign negative. Coordination normal.     Gait: Gait normal.     Comments: Right upper extremity is  slightly weaker compared to the left, however patient complains of pain in the right neck and shoulder which is chronic for her and these findings are not new, per family at bedside.  No obvious visual field cuts.  Romberg negative, however patient is a bit off balance when she stands and closes her eyes.  Patient is able to stand from a sitting position and walk in the room without difficulty.     ED Results / Procedures / Treatments   Labs (all labs ordered are listed, but only abnormal results are displayed) Labs Reviewed  CBC WITH DIFFERENTIAL/PLATELET  BASIC METABOLIC PANEL    ED ECG REPORT   Date: 02/14/2023  Rate: 78  Rhythm: normal sinus rhythm  QRS Axis: normal  Intervals: normal  ST/T Wave abnormalities: normal  Conduction Disutrbances:none  Narrative Interpretation:   Old EKG Reviewed: unchanged  I have personally reviewed the EKG tracing and agree with the computerized printout as noted.   Radiology CT Head Wo Contrast  Result Date: 02/14/2023 CLINICAL DATA:  Dizziness, neurological deficit suspected EXAM: CT HEAD WITHOUT CONTRAST TECHNIQUE: Contiguous axial images were obtained from the base of the skull through the vertex without intravenous contrast. RADIATION DOSE REDUCTION: This exam was performed according to the departmental dose-optimization program which includes automated exposure control, adjustment of the mA and/or kV according to patient size and/or use of iterative reconstruction technique. COMPARISON:  None Available. FINDINGS: Brain: No acute intracranial findings are seen. There are no signs of bleeding within the cranium. Ventricles are not dilated. There is no focal edema or mass effect. Cortical sulci are prominent. There is subcentimeter low-density in mesial left temporal lobe suggesting small old lacunar infarct are cyst. Vascular: Unremarkable. Skull: No acute findings are seen. Sinuses/Orbits: There is mucosal thickening in ethmoid and maxillary  sinuses. Other: None. IMPRESSION: No acute intracranial findings are seen. Atrophy. Possible small old lacunar infarct in left basal ganglia. Chronic ethmoid and maxillary sinusitis. Electronically Signed   By: Ernie Avena M.D.   On: 02/14/2023 17:50    Procedures Procedures    Medications Ordered in ED Medications  meclizine (ANTIVERT) tablet 25 mg (25 mg Oral Given 02/14/23 1813)    ED Course/ Medical Decision Making/ A&P    Patient seen and examined. History obtained directly from patient.   Labs/EKG: CBC and BMP ordered in triage are unremarkable.  EKG personally reviewed and interpreted as above.  Imaging: Ordered CT head, without contrast  Medications/Fluids: None ordered  Most recent  vital signs reviewed and are as follows: BP (!) 131/96 (BP Location: Right Arm)   Pulse 77   Temp 98 F (36.7 C) (Oral)   Resp 18   Ht 5\' 4"  (1.626 m)   Wt 63 kg   LMP 10/06/2012   SpO2 99%   BMI 23.86 kg/m   Initial impression: Nonspecific dizziness, no focal neuro deficits.  6:31 PM Reassessment performed. Patient appears stable.  Per patient's family member, patient states that she is feeling a bit better.  I had a discussion with patient and patient's family member at bedside regarding how to proceed.  We discussed transfer for MRI versus trial of meclizine and close monitoring with need for PCP follow-up next week and return to the emergency department if symptoms were to evolve or worsen.  Discussed reassuring workup here, however discussed that ischemic strokes may be missed on CT of the head.  At this time, they are comfortable with discharge and would like to defer transfer for further imaging.  I feel this is reasonable as the patient symptoms are minor and nonfocal at this time.  Labs personally reviewed and interpreted including: CBC and BMP were unremarkable.  EKG was unremarkable.  Imaging personally visualized and interpreted including: CT of the brain, agree no  acute findings.  Reviewed pertinent lab work and imaging with patient at bedside. Questions answered.   Most current vital signs reviewed and are as follows: BP 114/76   Pulse 65   Temp 98 F (36.7 C) (Oral)   Resp 18   Ht 5\' 4"  (1.626 m)   Wt 63 kg   LMP 10/06/2012   SpO2 98%   BMI 23.86 kg/m   Plan: Discharge to home.   Prescriptions written for: Meclizine trial  Other home care instructions discussed: Close monitoring of symptoms  ED return instructions discussed: Patient counseled to return if they have weakness in their arms or legs, slurred speech, trouble walking or talking, confusion, trouble with their balance, or if they have any other concerns.   Follow-up instructions discussed: Patient encouraged to follow-up with their PCP in 3 days.                             Medical Decision Making Amount and/or Complexity of Data Reviewed Labs: ordered. Radiology: ordered.    Patient here today with nonspecific dizziness symptoms.  Her neuroexam is nonfocal.  She is able to ambulate without assistance but does have unsteadiness with standing still.  Symptoms are stable without decompensation during ED stay.  Cannot rule out small stroke, but overall picture would be atypical for acute ischemic stroke.  Patient with minimal risk factor profile.  Discussion on further evaluation versus monitoring and outpatient follow-up as above.  Family seems very supportive.  Otherwise EKG is nonischemic and I have low concern for ACS, PE.  Patient has chronic right-sided shoulder pain which is unchanged.  Considered carotid or vertebral artery dissection.  No carotid bruits and feel that symptoms are atypical for this.  The patient's vital signs, pertinent lab work and imaging were reviewed and interpreted as discussed in the ED course. Hospitalization was considered for further testing, treatments, or serial exams/observation. However as patient is well-appearing, has a stable exam, and  reassuring studies today, I do not feel that they warrant admission at this time. This plan was discussed with the patient who verbalizes agreement and comfort with this plan and seems reliable and able  to return to the Emergency Department with worsening or changing symptoms.          Final Clinical Impression(s) / ED Diagnoses Final diagnoses:  Dizziness    Rx / DC Orders ED Discharge Orders          Ordered    meclizine (ANTIVERT) 25 MG tablet  3 times daily PRN        02/14/23 1828              Renne Crigler, PA-C 02/14/23 1836    Vanetta Mulders, MD 02/14/23 2113

## 2023-02-14 NOTE — ED Triage Notes (Addendum)
Pt arrives with c/o hypertension. Pt has hx of HTN. Pt has recently decreased her BP meds. Pt endorses headache, ringing in her ears, dizziness, blurry vision in peripheral in her right eye, and neck pain. Pt has chronic neck pain. Pt denies focal weakness, slurred speech, facial droop, or sensation deficit. Pt a&ox4.

## 2023-07-04 ENCOUNTER — Encounter (HOSPITAL_BASED_OUTPATIENT_CLINIC_OR_DEPARTMENT_OTHER): Payer: Self-pay | Admitting: Emergency Medicine

## 2023-07-04 ENCOUNTER — Emergency Department (HOSPITAL_BASED_OUTPATIENT_CLINIC_OR_DEPARTMENT_OTHER)
Admission: EM | Admit: 2023-07-04 | Discharge: 2023-07-04 | Disposition: A | Discharge status: Home / Self Care | Payer: Medicaid Other | Attending: Emergency Medicine | Admitting: Emergency Medicine

## 2023-07-04 ENCOUNTER — Other Ambulatory Visit: Payer: Self-pay

## 2023-07-04 DIAGNOSIS — K219 Gastro-esophageal reflux disease without esophagitis: Secondary | ICD-10-CM | POA: Insufficient documentation

## 2023-07-04 DIAGNOSIS — R1013 Epigastric pain: Secondary | ICD-10-CM | POA: Diagnosis present

## 2023-07-04 LAB — CBC WITH DIFFERENTIAL/PLATELET
Abs Immature Granulocytes: 0.03 10*3/uL (ref 0.00–0.07)
Basophils Absolute: 0 10*3/uL (ref 0.0–0.1)
Basophils Relative: 0 %
Eosinophils Absolute: 0.1 10*3/uL (ref 0.0–0.5)
Eosinophils Relative: 1 %
HCT: 39 % (ref 36.0–46.0)
Hemoglobin: 12.6 g/dL (ref 12.0–15.0)
Immature Granulocytes: 0 %
Lymphocytes Relative: 29 %
Lymphs Abs: 2.2 10*3/uL (ref 0.7–4.0)
MCH: 29 pg (ref 26.0–34.0)
MCHC: 32.3 g/dL (ref 30.0–36.0)
MCV: 89.7 fL (ref 80.0–100.0)
Monocytes Absolute: 0.5 10*3/uL (ref 0.1–1.0)
Monocytes Relative: 7 %
Neutro Abs: 4.7 10*3/uL (ref 1.7–7.7)
Neutrophils Relative %: 63 %
Platelets: 301 10*3/uL (ref 150–400)
RBC: 4.35 MIL/uL (ref 3.87–5.11)
RDW: 13.5 % (ref 11.5–15.5)
WBC: 7.5 10*3/uL (ref 4.0–10.5)
nRBC: 0 % (ref 0.0–0.2)

## 2023-07-04 LAB — BASIC METABOLIC PANEL
Anion gap: 10 (ref 5–15)
BUN: 13 mg/dL (ref 6–20)
CO2: 24 mmol/L (ref 22–32)
Calcium: 9.2 mg/dL (ref 8.9–10.3)
Chloride: 106 mmol/L (ref 98–111)
Creatinine, Ser: 0.72 mg/dL (ref 0.44–1.00)
GFR, Estimated: >60 mL/min (ref >60)
Glucose, Bld: 88 mg/dL (ref 70–99)
Potassium: 4.4 mmol/L (ref 3.5–5.1)
Sodium: 140 mmol/L (ref 135–145)

## 2023-07-04 LAB — TROPONIN I (HIGH SENSITIVITY): Troponin I (High Sensitivity): <2 ng/L (ref <18)

## 2023-07-04 MED ORDER — PANTOPRAZOLE SODIUM 20 MG PO TBEC: 20.0000 mg | DELAYED_RELEASE_TABLET | Freq: Every day | ORAL | 1 refills | Status: AC

## 2023-07-04 MED ORDER — PANTOPRAZOLE SODIUM 40 MG PO TBEC
40.0000 mg | DELAYED_RELEASE_TABLET | Freq: Once | ORAL | Status: AC
  Administered 2023-07-04: 40 mg via ORAL
  Filled 2023-07-04: qty 1

## 2023-07-04 MED ORDER — FAMOTIDINE 20 MG PO TABS: 20.0000 mg | ORAL_TABLET | Freq: Two times a day (BID) | ORAL | 0 refills | Status: AC | PRN

## 2023-07-04 MED ORDER — FAMOTIDINE IN NACL 20-0.9 MG/50ML-% IV SOLN
20.0000 mg | Freq: Once | INTRAVENOUS | Status: AC
  Administered 2023-07-04: 20 mg via INTRAVENOUS
  Filled 2023-07-04: qty 50

## 2023-07-04 MED ORDER — MAALOX MAX 400-400-40 MG/5ML PO SUSP: 10.0000 mL | Freq: Four times a day (QID) | ORAL | 0 refills | Status: AC | PRN

## 2023-07-04 MED ORDER — ALUM & MAG HYDROXIDE-SIMETH 200-200-20 MG/5ML PO SUSP
30.0000 mL | Freq: Once | ORAL | Status: AC
  Administered 2023-07-04: 30 mL via ORAL
  Filled 2023-07-04: qty 30

## 2023-07-04 NOTE — ED Provider Notes (Signed)
[place] [facility] AT [facility] Provider Note   CSN: [id number] Arrival date & time: 07/04/23  8295     History  Chief Complaint  Patient presents with   Gastroesophageal Reflux    Becky Goodwin is a 57 y.o. female.  Patient is a 57 year old female with past medical history of GERD, pretension, and hyperlipidemia presenting for complaints of chest pain.  Patient admits to epigastric chest pain that radiates to the sternum, burning, and without radiation that started last night.  She states she has been out of her pantoprazole prescription for the past 2 days.  She admits to fasting yesterday when she went into her PCPs office for blood work and had coffee and sweets afterward on an empty stomach.  When asked if this feels like patient's typical acid reflux she states that it feels much more severe and crushing than her normal symptoms.  The history is provided by the patient. No language interpreter was used.  Gastroesophageal Reflux Associated symptoms include chest pain. Pertinent negatives include no abdominal pain and no shortness of breath.       Home Medications Prior to Admission medications   Medication Sig Start Date End Date Taking? Authorizing Provider  alum & mag hydroxide-simeth (MAALOX MAX) 400-400-40 MG/5ML suspension Take 10 mLs by mouth every 6 (six) hours as needed for indigestion. 07/04/23  Yes Edwin Dada P, DO  famotidine (PEPCID) 20 MG tablet Take 1 tablet (20 mg total) by mouth every 12 (twelve) hours as needed for heartburn or indigestion. 07/04/23  Yes Edwin Dada P, DO  pantoprazole (PROTONIX) 20 MG tablet Take 1 tablet (20 mg total) by mouth daily. 07/04/23  Yes Edwin Dada P, DO  betamethasone valerate ointment (VALISONE) 0.1 % Use a pea sized amount topically BID for 1-2 weeks as needed. 06/30/19   Romualdo Bolk, MD  conjugated estrogens (PREMARIN) vaginal cream Insert 0.5 grams vaginally twice weekly 06/30/19   Romualdo Bolk, MD  cyclobenzaprine (FLEXERIL) 5 MG tablet Take 1 tablet (5 mg total) by mouth 2 (two) times daily as needed for muscle spasms. 02/26/19   Hall-Potvin, [place], PA-C  Docusate Calcium (STOOL SOFTENER PO) Take by mouth.    [provider]  meclizine (ANTIVERT) 25 MG tablet Take 1 tablet (25 mg total) by mouth 3 (three) times daily as needed for dizziness. 02/14/23   Renne Crigler, PA-C  ondansetron (ZOFRAN) 4 MG tablet Take 1 tablet (4 mg total) by mouth every 6 (six) hours. 02/26/19   Hall-Potvin, [place], PA-C      Allergies    Hydrocodone    Review of Systems   Review of Systems  Constitutional:  Negative for chills and fever.  HENT:  Negative for ear pain and sore throat.   Eyes:  Negative for pain and visual disturbance.  Respiratory:  Negative for cough and shortness of breath.   Cardiovascular:  Positive for chest pain. Negative for palpitations.  Gastrointestinal:  Negative for abdominal pain and vomiting.  Genitourinary:  Negative for dysuria and hematuria.  Musculoskeletal:  Negative for arthralgias and back pain.  Skin:  Negative for color change and rash.  Neurological:  Negative for seizures and syncope.  All other systems reviewed and are negative.   Physical Exam Updated Vital Signs BP 136/87 (BP Location: Right Arm)   Pulse 73   Temp 97.9 F (36.6 C) (Oral)   Resp 18   LMP 10/06/2012   SpO2 99%  Physical Exam Vitals and nursing note  reviewed.  Constitutional:      General: She is not in acute distress.    Appearance: She is well-developed.  HENT:     Head: Normocephalic and atraumatic.  Eyes:     Conjunctiva/sclera: Conjunctivae normal.  Cardiovascular:     Rate and Rhythm: Normal rate and regular rhythm.     Heart sounds: No murmur heard. Pulmonary:     Effort: Pulmonary effort is normal. No respiratory distress.     Breath sounds: Normal breath sounds.  Abdominal:     Palpations: Abdomen is soft.     Tenderness: There is no  abdominal tenderness.  Musculoskeletal:        General: No swelling.     Cervical back: Neck supple.  Skin:    General: Skin is warm and dry.     Capillary Refill: Capillary refill takes less than 2 seconds.  Neurological:     Mental Status: She is alert.  Psychiatric:        Mood and Affect: Mood normal.     ED Results / Procedures / Treatments   Labs (all labs ordered are listed, but only abnormal results are displayed) Labs Reviewed  CBC WITH DIFFERENTIAL/PLATELET  BASIC METABOLIC PANEL  TROPONIN I (HIGH SENSITIVITY)    EKG EKG Interpretation Date/Time:  Friday July 04 2023 08:02:13 EST Ventricular Rate:  69 PR Interval:  180 QRS Duration:  88 QT Interval:  372 QTC Calculation: 399 R Axis:   3  Text Interpretation: Sinus rhythm Low voltage, precordial leads Confirmed by Edwin Dada ([id number]) on 07/04/2023 9:00:37 AM  Radiology No results found.  Procedures Procedures    Medications Ordered in ED Medications  pantoprazole (PROTONIX) EC tablet 40 mg (40 mg Oral Given 07/04/23 0806)  famotidine (PEPCID) IVPB 20 mg premix (20 mg Intravenous New Bag/Given 07/04/23 0816)  alum & mag hydroxide-simeth (MAALOX/MYLANTA) 200-200-20 MG/5ML suspension 30 mL (30 mLs Oral Given 07/04/23 0806)    ED Course/ Medical Decision Making/ A&P                                 Medical Decision Making Amount and/or Complexity of Data Reviewed Labs: ordered. ECG/medicine tests: ordered.  Risk OTC drugs. Prescription drug management.   35:3 AM 57 year old female with past medical history of GERD, pretension, and hyperlipidemia presenting for complaints of chest pain.  Patient is alert and oriented x 3, no acute distress, afebrile, stable vital signs.  Equal bilateral breath sounds with no adventitious lung sounds.  No cardiac murmurs.  History and physical exam suggest that patient symptoms are secondary to GERD however because patient states they are much different than her  normal acid reflux we will obtain EKG and cardiac enzymes to rule out cardiac etiology due to patient's risk factors of hypertension and hyperlipidemia.   EKG on 07/02/2023 on 802 interpreted by myself demonstrates sinus rhythm with a rate of 69 bpm.  No ST segment elevation or depression.  No T wave inversions.  Normal axis.  Normal intervals.  Troponin stable.  Electrolytes stable.  Signs or symptoms of aortic dissection, ischemia, pulmonary embolism, pneumothorax, pleural effusions, or any other life-threatening etiologies of chest pain at this time.  Symptoms improved after meant of GERD with Maalox, Pepcid, and pantoprazole.  Patient stable for discharge home at this time.  Medication sent to pharmacy for her.  Patient in no distress and overall condition improved here in the ED. Detailed  discussions were had with the patient regarding current findings, and need for close f/u with PCP or on call doctor. The patient has been instructed to return immediately if the symptoms worsen in any way for re-evaluation. Patient verbalized understanding and is in agreement with current care plan. All questions answered prior to discharge.        Final Clinical Impression(s) / ED Diagnoses Final diagnoses:  Gastroesophageal reflux disease, unspecified whether esophagitis present    Rx / DC Orders ED Discharge Orders          Ordered    famotidine (PEPCID) 20 MG tablet  Every 12 hours PRN        07/04/23 0903    alum & mag hydroxide-simeth (MAALOX MAX) 400-400-40 MG/5ML suspension  Every 6 hours PRN        07/04/23 0903    pantoprazole (PROTONIX) 20 MG tablet  Daily        07/04/23 0903              Franne Forts, DO 07/04/23 [phone]

## 2023-07-04 NOTE — ED Triage Notes (Signed)
Pt reports acid reflux that developed yesterday morning. Unable to keep any food down. Throwing up all drinks and food.  Reports taking pantoprazole daily and has been out of prescription for 2 days. Tried to refill prescription but needs dr approval.

## 2023-07-04 NOTE — Discharge Instructions (Addendum)
Pantoprazole sent to pharmacy with 30-day supply and 1 refill.  Please take this daily help decrease overall acid production in the stomach.  Maalox sent to pharmacy.  Use every 6 hours for acid reflux symptoms.  Acid sent to pharmacy.  Please take this every 12 hours as needed for acid reflux symptoms.
